# Patient Record
Sex: Male | Born: 1990 | Race: Black or African American | Hispanic: No | Marital: Single | Smoking: Current every day smoker
Health system: Southern US, Community
[De-identification: ages and names within clinical notes are randomized; demographics above are authoritative.]

## PROBLEM LIST (undated history)

## (undated) DIAGNOSIS — F419 Anxiety disorder, unspecified: Secondary | ICD-10-CM

## (undated) DIAGNOSIS — Z9889 Other specified postprocedural states: Secondary | ICD-10-CM

## (undated) DIAGNOSIS — F1910 Other psychoactive substance abuse, uncomplicated: Secondary | ICD-10-CM

## (undated) DIAGNOSIS — I10 Essential (primary) hypertension: Secondary | ICD-10-CM

## (undated) HISTORY — PX: HAND SURGERY: SHX662

---
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
## 2008-10-20 ENCOUNTER — Emergency Department: Payer: Self-pay | Admitting: Internal Medicine

## 2011-02-13 ENCOUNTER — Emergency Department: Payer: Self-pay | Admitting: Emergency Medicine

## 2012-07-22 ENCOUNTER — Emergency Department: Payer: Self-pay | Admitting: Emergency Medicine

## 2015-11-05 ENCOUNTER — Emergency Department: Payer: Medicaid Other

## 2015-11-05 ENCOUNTER — Emergency Department
Admission: EM | Admit: 2015-11-05 | Discharge: 2015-11-05 | Disposition: A | Discharge status: Home / Self Care | Payer: Medicaid Other | Attending: Emergency Medicine | Admitting: Emergency Medicine

## 2015-11-05 DIAGNOSIS — R350 Frequency of micturition: Secondary | ICD-10-CM | POA: Diagnosis not present

## 2015-11-05 DIAGNOSIS — R1031 Right lower quadrant pain: Secondary | ICD-10-CM

## 2015-11-05 LAB — COMPREHENSIVE METABOLIC PANEL
ALBUMIN: 4.5 g/dL (ref 3.5–5.0)
ALK PHOS: 82 U/L (ref 38–126)
ALT: 34 U/L (ref 17–63)
ANION GAP: 7 (ref 5–15)
AST: 28 U/L (ref 15–41)
BILIRUBIN TOTAL: 0.7 mg/dL (ref 0.3–1.2)
BUN: 16 mg/dL (ref 6–20)
CALCIUM: 9.2 mg/dL (ref 8.9–10.3)
CO2: 24 mmol/L (ref 22–32)
Chloride: 106 mmol/L (ref 101–111)
Creatinine, Ser: 0.98 mg/dL (ref 0.61–1.24)
GFR calc Af Amer: >60 mL/min (ref >60)
GFR calc non Af Amer: >60 mL/min (ref >60)
GLUCOSE: 99 mg/dL (ref 65–99)
POTASSIUM: 4 mmol/L (ref 3.5–5.1)
SODIUM: 137 mmol/L (ref 135–145)
TOTAL PROTEIN: 7.4 g/dL (ref 6.5–8.1)

## 2015-11-05 LAB — CBC WITH DIFFERENTIAL/PLATELET
BASOS ABS: 0 10*3/uL (ref 0–0.1)
BASOS PCT: 1 %
EOS ABS: 0.1 10*3/uL (ref 0–0.7)
Eosinophils Relative: 1 %
HEMATOCRIT: 43.1 % (ref 40.0–52.0)
HEMOGLOBIN: 14.4 g/dL (ref 13.0–18.0)
Lymphocytes Relative: 23 %
Lymphs Abs: 1.8 10*3/uL (ref 1.0–3.6)
MCH: 29.1 pg (ref 26.0–34.0)
MCHC: 33.4 g/dL (ref 32.0–36.0)
MCV: 87.1 fL (ref 80.0–100.0)
Monocytes Absolute: 0.4 10*3/uL (ref 0.2–1.0)
Monocytes Relative: 6 %
NEUTROS ABS: 5.7 10*3/uL (ref 1.4–6.5)
NEUTROS PCT: 69 %
Platelets: 285 10*3/uL (ref 150–440)
RBC: 4.94 MIL/uL (ref 4.40–5.90)
RDW: 12.9 % (ref 11.5–14.5)
WBC: 8.1 10*3/uL (ref 3.8–10.6)

## 2015-11-05 LAB — URINALYSIS COMPLETE WITH MICROSCOPIC (ARMC ONLY)
Bacteria, UA: NONE SEEN
Bilirubin Urine: NEGATIVE
Glucose, UA: NEGATIVE mg/dL
HGB URINE DIPSTICK: NEGATIVE
KETONES UR: NEGATIVE mg/dL
LEUKOCYTES UA: NEGATIVE
Nitrite: NEGATIVE
PH: 6 (ref 5.0–8.0)
PROTEIN: NEGATIVE mg/dL
RBC / HPF: NONE SEEN RBC/hpf (ref 0–5)
SPECIFIC GRAVITY, URINE: 1.021 (ref 1.005–1.030)
Squamous Epithelial / LPF: NONE SEEN

## 2015-11-05 MED ORDER — IOHEXOL 240 MG/ML SOLN
25.0000 mL | Freq: Once | INTRAMUSCULAR | Status: AC | PRN
  Administered 2015-11-05: 25 mL via ORAL
  Filled 2015-11-05: qty 25

## 2015-11-05 MED ORDER — IOHEXOL 300 MG/ML  SOLN
100.0000 mL | Freq: Once | INTRAMUSCULAR | Status: AC | PRN
  Administered 2015-11-05: 100 mL via INTRAVENOUS
  Filled 2015-11-05: qty 100

## 2015-11-05 NOTE — Discharge Instructions (Signed)
Abdominal Pain, Adult Many things can cause belly (abdominal) pain. Most times, the belly pain is not dangerous. Many cases of belly pain can be watched and treated at home. HOME CARE   Do not take medicines that help you go poop (laxatives) unless told to by your doctor.  Only take medicine as told by your doctor.  Eat or drink as told by your doctor. Your doctor will tell you if you should be on a special diet. GET HELP IF:  You do not know what is causing your belly pain.  You have belly pain while you are sick to your stomach (nauseous) or have runny poop (diarrhea).  You have pain while you pee or poop.  Your belly pain wakes you up at night.  You have belly pain that gets worse or better when you eat.  You have belly pain that gets worse when you eat fatty foods.  You have a fever. GET HELP RIGHT AWAY IF:   The pain does not go away within 2 hours.  You keep throwing up (vomiting).  The pain changes and is only in the right or left part of the belly.  You have bloody or tarry looking poop. MAKE SURE YOU:   Understand these instructions.  Will watch your condition.  Will get help right away if you are not doing well or get worse.   This information is not intended to replace advice given to you by your health care provider. Make sure you discuss any questions you have with your health care provider.   Document Released: 03/13/2008 Document Revised: 10/16/2014 Document Reviewed: 06/04/2013 Elsevier Interactive Patient Education [employer]   Again making a list of foods that she feet and to see if there is any correlation between your abdominal pain and diet. Increase fiber and fruits to avoid constipation. Avoid ibuprofen and Advil. He may take Tylenol if needed. Call and make an appointment with gastroenterology for further evaluation.

## 2015-11-05 NOTE — ED Notes (Addendum)
Presents with right lower abd /side pain   States this pain started about 1 year ago became worse over past 5 days.  Pain is non stop but worse today ..denies any nausea/vomiting or fever   Some urinary freq esp at night

## 2015-11-05 NOTE — ED Provider Notes (Signed)
[facility] Emergency Department Provider Note  ____________________________________________  Time seen: Approximately 12:44 PM  I have reviewed the triage vital signs and the nursing notes.   HISTORY  Chief Complaint Flank Pain   HPI Casey [name] is a 25 y.o. male is here with right lower quadrant pain which has become worse over the last 5 years. He states that pain is constant and nonradiating and worse with movement. He denies any nausea, vomiting, or fever. He states there is some urinary frequency last evening. He gives a history of pain off and on for one year however this is different and much more painful than what he has been experiencing. He denies any hematuria and bowel movements have been normal. He denies any radiation of pain into the groin. Currently his pain is 10 out of 10. His last meal was at breakfast at approximately 9 AM.   History reviewed. No pertinent past medical history.  There are no active problems to display for this patient.   History reviewed. No pertinent past surgical history.  No current outpatient prescriptions on file.  Allergies Review of patient's allergies indicates no known allergies.  No family history on file.  Social History Social History  Substance Use Topics  . Smoking status: Never Smoker   . Smokeless tobacco: None  . Alcohol Use: No    Review of Systems Constitutional: No fever/chills Cardiovascular: Denies chest pain. Respiratory: Denies shortness of breath. Gastrointestinal: Positive abdominal pain.  No nausea, no vomiting.  No diarrhea.  No constipation. Genitourinary: Frequent urination Musculoskeletal: Negative for back pain. Right lateral abdominal pain Skin: Negative for rash. Neurological: Negative for headaches, focal weakness or numbness.  10-point ROS otherwise negative.  ____________________________________________   PHYSICAL EXAM:  VITAL SIGNS: ED Triage Vitals  Enc  Vitals Group     BP 11/05/15 1228 141/88 mmHg     Pulse Rate 11/05/15 1228 64     Resp 11/05/15 1228 18     Temp 11/05/15 1228 98.4 F (36.9 C)     Temp Source 11/05/15 1228 Oral     SpO2 11/05/15 1228 98 %     Weight 11/05/15 1228 225 lb (102.059 kg)     Height 11/05/15 1228  (1.753 m)     Head Cir --      Peak Flow --      Pain Score 11/05/15 1228 10     Pain Loc --      Pain Edu? --      Excl. in GC? --     Constitutional: Alert and oriented. Well appearing and in no acute distress. Eyes: Conjunctivae are normal. PERRL. EOMI. Head: Atraumatic. Nose: No congestion/rhinnorhea. Mouth/Throat: Mucous membranes are moist.  Oropharynx non-erythematous. Neck: No stridor.   Cardiovascular: Normal rate, regular rhythm. Grossly normal heart sounds.  Good peripheral circulation. Respiratory: Normal respiratory effort.  No retractions. Lungs CTAB. Gastrointestinal: Soft. No distention. Marked tenderness on palpation of the right lower quadrant and proximal to McBurney's point. Patient was even tender with use of stethoscope. Bowel sounds are hypoactive at the time of exam. Musculoskeletal: Moves upper and lower extremities without any difficulty. Neurologic:  Normal speech and language. No gross focal neurologic deficits are appreciated. No gait instability. Skin:  Skin is warm, dry and intact. No rash noted. Psychiatric: Mood and affect are normal. Speech and behavior are normal.  ____________________________________________   LABS (all labs ordered are listed, but only abnormal results are displayed)  Labs Reviewed  URINALYSIS  COMPLETEWITH MICROSCOPIC ([facility] ONLY) - Abnormal; Notable for the following:    Color, Urine YELLOW (*)    APPearance CLEAR (*)    All other components within normal limits  CBC WITH DIFFERENTIAL/PLATELET  COMPREHENSIVE METABOLIC PANEL   RADIOLOGY CT scan of abdomen with contrast is negative per  radiologist.  ____________________________________________   PROCEDURES  Procedure(s) performed: None  Critical Care performed: No  ____________________________________________   INITIAL IMPRESSION / ASSESSMENT AND PLAN / ED COURSE  Pertinent labs & imaging results that were available during my care of the patient were reviewed by me and considered in my medical decision making (see chart for details).  Patient was made aware that lab work and urinalysis was negative. CT scan did not explain his acute on chronic back pain. Patient was referred to the gastroenterology service on call and told to make an appointment next week when the office is open. In the meantime he is to be aware of any foods that he eats that makes his abdominal pain worse and avoid constipation. ____________________________________________   FINAL CLINICAL IMPRESSION(S) / ED DIAGNOSES  Final diagnoses:  Right lower quadrant abdominal pain      Tommi Rumps, PA-C 11/05/15 1607  Jennye Moccasin, MD 11/05/15 [phone]

## 2015-11-05 NOTE — ED Notes (Signed)
Pt c/o right lower lateral side pain for years, states the pain has worsened in today.. Denies N/V/D

## 2017-03-12 ENCOUNTER — Encounter: Payer: Self-pay | Admitting: *Deleted

## 2017-03-12 DIAGNOSIS — M545 Low back pain: Secondary | ICD-10-CM | POA: Insufficient documentation

## 2017-03-12 DIAGNOSIS — R109 Unspecified abdominal pain: Secondary | ICD-10-CM | POA: Diagnosis not present

## 2017-03-12 NOTE — ED Triage Notes (Signed)
Pt complains of right lumbar pain, pt denies any other symptoms, pt reports pain started today

## 2017-03-13 ENCOUNTER — Emergency Department: Payer: Medicaid Other

## 2017-03-13 ENCOUNTER — Emergency Department
Admission: EM | Admit: 2017-03-13 | Discharge: 2017-03-13 | Disposition: A | Discharge status: Home / Self Care | Payer: Medicaid Other | Attending: Emergency Medicine | Admitting: Emergency Medicine

## 2017-03-13 DIAGNOSIS — M545 Low back pain, unspecified: Secondary | ICD-10-CM

## 2017-03-13 DIAGNOSIS — R109 Unspecified abdominal pain: Secondary | ICD-10-CM

## 2017-03-13 LAB — CBC
HEMATOCRIT: 41.2 % (ref 40.0–52.0)
HEMOGLOBIN: 14.3 g/dL (ref 13.0–18.0)
MCH: 30.2 pg (ref 26.0–34.0)
MCHC: 34.8 g/dL (ref 32.0–36.0)
MCV: 86.7 fL (ref 80.0–100.0)
Platelets: 269 10*3/uL (ref 150–440)
RBC: 4.75 MIL/uL (ref 4.40–5.90)
RDW: 13.5 % (ref 11.5–14.5)
WBC: 10.2 10*3/uL (ref 3.8–10.6)

## 2017-03-13 LAB — BASIC METABOLIC PANEL
Anion gap: 8 (ref 5–15)
BUN: 17 mg/dL (ref 6–20)
CHLORIDE: 107 mmol/L (ref 101–111)
CO2: 25 mmol/L (ref 22–32)
CREATININE: 1.12 mg/dL (ref 0.61–1.24)
Calcium: 9.2 mg/dL (ref 8.9–10.3)
GFR calc Af Amer: >60 mL/min (ref >60)
GFR calc non Af Amer: >60 mL/min (ref >60)
Glucose, Bld: 96 mg/dL (ref 65–99)
POTASSIUM: 3.9 mmol/L (ref 3.5–5.1)
Sodium: 140 mmol/L (ref 135–145)

## 2017-03-13 LAB — URINALYSIS, COMPLETE (UACMP) WITH MICROSCOPIC
Bacteria, UA: NONE SEEN
GLUCOSE, UA: NEGATIVE mg/dL
Hgb urine dipstick: NEGATIVE
Ketones, ur: 5 mg/dL — AB
Leukocytes, UA: NEGATIVE
Nitrite: NEGATIVE
Protein, ur: NEGATIVE mg/dL
RBC / HPF: NONE SEEN RBC/hpf (ref 0–5)
SPECIFIC GRAVITY, URINE: 1.033 — AB (ref 1.005–1.030)
pH: 5 (ref 5.0–8.0)

## 2017-03-13 MED ORDER — KETOROLAC TROMETHAMINE 30 MG/ML IJ SOLN
30.0000 mg | Freq: Once | INTRAMUSCULAR | Status: AC
  Administered 2017-03-13: 30 mg via INTRAVENOUS
  Filled 2017-03-13: qty 1

## 2017-03-13 MED ORDER — MORPHINE SULFATE (PF) 4 MG/ML IV SOLN
4.0000 mg | Freq: Once | INTRAVENOUS | Status: AC
Start: 2017-03-13 — End: 2017-03-13
  Administered 2017-03-13: 4 mg via INTRAVENOUS
  Filled 2017-03-13: qty 1

## 2017-03-13 MED ORDER — ETODOLAC 200 MG PO CAPS: 200.0000 mg | ORAL_CAPSULE | Freq: Three times a day (TID) | ORAL | 0 refills | Status: DC

## 2017-03-13 MED ORDER — TRAMADOL HCL 50 MG PO TABS: 50.0000 mg | ORAL_TABLET | Freq: Four times a day (QID) | ORAL | 0 refills | Status: DC | PRN

## 2017-03-13 MED ORDER — SODIUM CHLORIDE 0.9 % IV BOLUS (SEPSIS)
1000.0000 mL | Freq: Once | INTRAVENOUS | Status: AC
  Administered 2017-03-13: 1000 mL via INTRAVENOUS

## 2017-03-13 NOTE — ED Notes (Signed)
Pt reports hx of kidney stones 

## 2017-03-13 NOTE — ED Notes (Signed)
ED Provider at bedside. 

## 2017-03-13 NOTE — ED Notes (Signed)
meds given.  Family with pt.  

## 2017-03-13 NOTE — ED Notes (Signed)
Pt return from ct scan 

## 2017-03-13 NOTE — Discharge Instructions (Signed)
Please follow up with your primary care physician.

## 2017-03-13 NOTE — ED Notes (Signed)
Pt has low/mid back pain.  No known injury.  Pt staes pain for 1 hour.  Took advil without relief.  Denies urinary sx  Family with pt.

## 2017-03-13 NOTE — ED Provider Notes (Signed)
[facility] Emergency Department Provider Note   ____________________________________________   First MD Initiated Contact with Patient 03/13/17 0016     (approximate)  I have reviewed the triage vital signs and the nursing notes.   HISTORY  Chief Complaint Back Pain    HPI Casey [name] is a 26 y.o. male who came into the hospital today with some back pain. He reports that the pain started about 2 hours prior to his arrival. He was sitting on the couch watching a movie when he started having pain on his right side in his low back with some radiation around to his abdomen. The patient reports he took some Advil but it didn't help. The patient rates his pain a 10 out of 10 in intensity. He denies any pain with urination or blood in his urine. Similar symptoms last year and was told he was constipated. He reports that he still does not have regular bowel movements. The patient came into the hospital today for evaluation of this back pain.The patient had no fevers, nausea, diarrhea, chest pain or shortness of breath.   History reviewed. No pertinent past medical history.  There are no active problems to display for this patient.   History reviewed. No pertinent surgical history.  Prior to Admission medications   Medication Sig Start Date End Date Taking? Authorizing Provider  etodolac (LODINE) 200 MG capsule Take 1 capsule (200 mg total) by mouth every 8 (eight) hours. 03/13/17   Rebecka Apley, MD  traMADol (ULTRAM) 50 MG tablet Take 1 tablet (50 mg total) by mouth every 6 (six) hours as needed. 03/13/17   Rebecka Apley, MD    Allergies Patient has no known allergies.  No family history on file.  Social History Social History  Substance Use Topics  . Smoking status: Never Smoker  . Smokeless tobacco: Not on file  . Alcohol use No    Review of Systems  Constitutional: No fever/chills Eyes: No visual changes. ENT: No sore  throat. Cardiovascular: Denies chest pain. Respiratory: Denies shortness of breath. Gastrointestinal:  abdominal pain.  No nausea, no vomiting.  No diarrhea.  No constipation. Genitourinary: Negative for dysuria. Musculoskeletal: back pain. Skin: Negative for rash. Neurological: Negative for headaches, focal weakness or numbness.   ____________________________________________   PHYSICAL EXAM:  VITAL SIGNS: ED Triage Vitals  Enc Vitals Group     BP 03/12/17 2236 (!) 162/100     Pulse Rate 03/12/17 2236 84     Resp 03/12/17 2236 20     Temp 03/12/17 2236 98.1 F (36.7 C)     Temp Source 03/12/17 2236 Oral     SpO2 03/12/17 2236 100 %     Weight 03/12/17 2236 240 lb (108.9 kg)     Height 03/12/17 2236 5\' 9"  (1.753 m)     Head Circumference --      Peak Flow --      Pain Score 03/12/17 2235 10     Pain Loc --      Pain Edu? --      Excl. in GC? --     Constitutional: Alert and oriented. Well appearing and in Moderate distress. Eyes: Conjunctivae are normal. PERRL. EOMI. Head: Atraumatic. Nose: No congestion/rhinnorhea. Mouth/Throat: Mucous membranes are moist.  Oropharynx non-erythematous. Cardiovascular: Normal rate, regular rhythm. Grossly normal heart sounds.  Good peripheral circulation. Respiratory: Normal respiratory effort.  No retractions. Lungs CTAB. Gastrointestinal: Soft and nontender. No distention. Positive bowel sounds with some right  CVA tenderness to palpation Musculoskeletal: No lower extremity tenderness nor edema.   Neurologic:  Normal speech and language.  Skin:  Skin is warm, dry and intact.Marland Kitchen. Psychiatric: Mood and affect are normal.   ____________________________________________   LABS (all labs ordered are listed, but only abnormal results are displayed)  Labs Reviewed  URINALYSIS, COMPLETE (UACMP) WITH MICROSCOPIC - Abnormal; Notable for the following:       Result Value   Color, Urine YELLOW (*)    APPearance CLEAR (*)    Specific Gravity,  Urine 1.033 (*)    Bilirubin Urine SMALL (*)    Ketones, ur 5 (*)    Squamous Epithelial / LPF 0-5 (*)    All other components within normal limits  CBC  BASIC METABOLIC PANEL   ____________________________________________  EKG  none ____________________________________________  RADIOLOGY  Ct Renal Stone Study  Result Date: 03/13/2017 CLINICAL DATA:  Right flank pain. EXAM: CT ABDOMEN AND PELVIS WITHOUT CONTRAST TECHNIQUE: Multidetector CT imaging of the abdomen and pelvis was performed following the standard protocol without IV contrast. COMPARISON:  CT 11/05/2015 FINDINGS: Lower chest: The lung bases are clear. Hepatobiliary: No focal liver abnormality is seen. No gallstones, gallbladder wall thickening, or biliary dilatation. Pancreas: No ductal dilatation or inflammation. Spleen: Normal in size without focal abnormality. Adrenals/Urinary Tract: Adrenal glands are unremarkable. Kidneys are normal, without renal calculi, focal lesion, or hydronephrosis. Bladder is completely decompressed and not well evaluated. No evidence of bladder stone. Right pelvic calcification is a phlebolith and unchanged from prior CT, inferior to the ureteral insertion. Stomach/Bowel: Stomach is within normal limits. Appendix appears normal. No evidence of bowel wall thickening, distention, or inflammatory changes. Vascular/Lymphatic: No significant vascular findings are present. No enlarged abdominal or pelvic lymph nodes. Reproductive: Prostate is unremarkable. Other: No free air, free fluid, or intra-abdominal fluid collection. Musculoskeletal: There are no acute or suspicious osseous abnormalities. IMPRESSION: No renal stones or obstructive uropathy. No acute abnormality in the abdomen/pelvis. Electronically Signed   By: Rubye OaksMelanie  Ehinger M.D.   On: 03/13/2017 01:42    ____________________________________________   PROCEDURES  Procedure(s) performed: None  Procedures  Critical Care performed:  No  ____________________________________________   INITIAL IMPRESSION / ASSESSMENT AND PLAN / ED COURSE  Pertinent labs & imaging results that were available during my care of the patient were reviewed by me and considered in my medical decision making (see chart for details).  This is a 26 year old male who comes into the hospital today with some back pain. The patient reports that started right before he came in. I did give the patient liter of normal saline as well as some morphine and I sent him for CT scan looking for possible kidney stones. The patient's CT scan is unremarkable and his urine does not show any blood. I did give the patient a shot of Toradol. I am unsure what caused this patient's pain but I will discharge him to follow-up with his primary care physician. The patient has no further complaints or concerns at this time. His pain is improved after the fluid and the pain medicine.  Clinical Course as of Mar 13 733  Tue Mar 13, 2017  0149 No renal stones or obstructive uropathy. No acute abnormality in the abdomen/pelvis.   CT Renal Soundra PilonStone Study [AW]    Clinical Course User Index [AW] Rebecka ApleyWebster, Cedar Ditullio P, MD     ____________________________________________   FINAL CLINICAL IMPRESSION(S) / ED DIAGNOSES  Final diagnoses:  Acute right-sided low back pain without sciatica  Flank pain      NEW MEDICATIONS STARTED DURING THIS VISIT:  Discharge Medication List as of 03/13/2017  2:11 AM    START taking these medications   Details  etodolac (LODINE) 200 MG capsule Take 1 capsule (200 mg total) by mouth every 8 (eight) hours., Starting Tue 03/13/2017, Print    traMADol (ULTRAM) 50 MG tablet Take 1 tablet (50 mg total) by mouth every 6 (six) hours as needed., Starting Tue 03/13/2017, Print         Note:  This document was prepared using Dragon voice recognition software and may include unintentional dictation errors.    Rebecka Apley, MD 03/13/17 [phone]

## 2017-10-14 ENCOUNTER — Encounter: Payer: Self-pay | Admitting: Intensive Care

## 2017-10-14 ENCOUNTER — Emergency Department: Payer: Medicaid Other

## 2017-10-14 ENCOUNTER — Emergency Department
Admission: EM | Admit: 2017-10-14 | Discharge: 2017-10-14 | Disposition: A | Discharge status: Home / Self Care | Payer: Medicaid Other | Attending: Emergency Medicine | Admitting: Emergency Medicine

## 2017-10-14 DIAGNOSIS — M79641 Pain in right hand: Secondary | ICD-10-CM | POA: Diagnosis present

## 2017-10-14 DIAGNOSIS — Y939 Activity, unspecified: Secondary | ICD-10-CM | POA: Diagnosis not present

## 2017-10-14 DIAGNOSIS — Y929 Unspecified place or not applicable: Secondary | ICD-10-CM | POA: Diagnosis not present

## 2017-10-14 DIAGNOSIS — S6991XA Unspecified injury of right wrist, hand and finger(s), initial encounter: Secondary | ICD-10-CM

## 2017-10-14 DIAGNOSIS — Z79899 Other long term (current) drug therapy: Secondary | ICD-10-CM | POA: Insufficient documentation

## 2017-10-14 DIAGNOSIS — Y999 Unspecified external cause status: Secondary | ICD-10-CM | POA: Insufficient documentation

## 2017-10-14 DIAGNOSIS — W228XXA Striking against or struck by other objects, initial encounter: Secondary | ICD-10-CM | POA: Diagnosis not present

## 2017-10-14 DIAGNOSIS — Z23 Encounter for immunization: Secondary | ICD-10-CM | POA: Insufficient documentation

## 2017-10-14 MED ORDER — KETOROLAC TROMETHAMINE 30 MG/ML IJ SOLN
30.0000 mg | Freq: Once | INTRAMUSCULAR | Status: AC
  Administered 2017-10-14: 30 mg via INTRAMUSCULAR
  Filled 2017-10-14: qty 1

## 2017-10-14 MED ORDER — IBUPROFEN 800 MG PO TABS: 800.0000 mg | ORAL_TABLET | Freq: Three times a day (TID) | ORAL | 0 refills | Status: DC | PRN

## 2017-10-14 MED ORDER — TETANUS-DIPHTH-ACELL PERTUSSIS 5-2.5-18.5 LF-MCG/0.5 IM SUSP
0.5000 mL | Freq: Once | INTRAMUSCULAR | Status: AC
Start: 2017-10-14 — End: 2017-10-14
  Administered 2017-10-14: 0.5 mL via INTRAMUSCULAR
  Filled 2017-10-14: qty 0.5

## 2017-10-14 NOTE — ED Provider Notes (Signed)
[facility] Emergency Department Provider Note  ____________________________________________  Time seen: Approximately 3:29 PM  I have reviewed the triage vital signs and the nursing notes.   HISTORY  Chief Complaint Hand Pain (right)    HPI Casey [name] is a 27 y.o. male that presents to the emergency department for right hand pain after injury. A motor from a car fell on his hand yesterday while doing car work. He is not concerned that anything is broken because he can move all of his fingers.  He is having pain primarily over his knuckles.  He has been applying alcohol to abrasions on hand.  No alleviating measures have been attempted.  No numbness, tingling.  History reviewed. No pertinent past medical history.  There are no active problems to display for this patient.   History reviewed. No pertinent surgical history.  Prior to Admission medications   Medication Sig Start Date End Date Taking? Authorizing Provider  etodolac (LODINE) 200 MG capsule Take 1 capsule (200 mg total) by mouth every 8 (eight) hours. 03/13/17   Rebecka ApleyWebster, Allison P, MD  ibuprofen (ADVIL,MOTRIN) 800 MG tablet Take 1 tablet (800 mg total) by mouth every 8 (eight) hours as needed. 10/14/17   Enid DerryWagner, Satoshi Kalas, PA-C  traMADol (ULTRAM) 50 MG tablet Take 1 tablet (50 mg total) by mouth every 6 (six) hours as needed. 03/13/17   Rebecka ApleyWebster, Allison P, MD    Allergies Patient has no known allergies.  History reviewed. No pertinent family history.  Social History Social History   Tobacco Use  . Smoking status: Never Smoker  . Smokeless tobacco: Never Used  Substance Use Topics  . Alcohol use: No  . Drug use: No     Review of Systems  Constitutional: No fever/chills Cardiovascular: No chest pain. Respiratory: No SOB. Musculoskeletal: Positive for hand pain. Skin: Negative for rash, ecchymosis.  Positive for abrasions. Neurological: Negative for numbness or  tingling   ____________________________________________   PHYSICAL EXAM:  VITAL SIGNS: ED Triage Vitals  Enc Vitals Group     BP 10/14/17 1354 140/88     Pulse Rate 10/14/17 1354 67     Resp 10/14/17 1354 16     Temp 10/14/17 1354 (!) 97.5 F (36.4 C)     Temp Source 10/14/17 1354 Oral     SpO2 10/14/17 1354 95 %     Weight 10/14/17 1355 220 lb (99.8 kg)     Height 10/14/17 1355 5\' 9"  (1.753 m)     Head Circumference --      Peak Flow --      Pain Score 10/14/17 1358 8     Pain Loc --      Pain Edu? --      Excl. in GC? --      Constitutional: Alert and oriented. Well appearing and in no acute distress. Eyes: Conjunctivae are normal. PERRL. EOMI. Head: Atraumatic. ENT:      Ears:      Nose: No congestion/rhinnorhea.      Mouth/Throat: Mucous membranes are moist.  Neck: No stridor.   Cardiovascular: Normal rate, regular rhythm.  Good peripheral circulation. Respiratory: Normal respiratory effort without tachypnea or retractions. Lungs CTAB. Good air entry to the bases with no decreased or absent breath sounds. Musculoskeletal: Full range of motion to all extremities. No gross deformities appreciated.  Full range of motion of all fingers and right wrist.  No visible swelling.  Compartments are soft. Neurologic:  Normal speech and language. No gross  focal neurologic deficits are appreciated.  Skin:  Skin is warm, dry.  Several half centimeter abrasions to right hand.    ____________________________________________   LABS (all labs ordered are listed, but only abnormal results are displayed)  Labs Reviewed - No data to display ____________________________________________  EKG   ____________________________________________  RADIOLOGY Lexine Baton, personally viewed and evaluated these images (plain radiographs) as part of my medical decision making, as well as reviewing the written report by the radiologist.  Dg Hand Complete Right  Result Date:  10/14/2017 CLINICAL DATA:  27 year old male with right hand pain and laceration. EXAM: RIGHT HAND - COMPLETE 3+ VIEW COMPARISON:  None. FINDINGS: No evidence of fracture or retained radiopaque foreign body. The visualized bones and joints are unremarkable. Normal mineralization. No lytic or blastic osseous lesion. IMPRESSION: Negative. Electronically Signed   By: Malachy Moan M.D.   On: 10/14/2017 14:31    ____________________________________________    PROCEDURES  Procedure(s) performed:    Procedures    Medications  Tdap (BOOSTRIX) injection 0.5 mL (0.5 mLs Intramuscular Given 10/14/17 1630)  ketorolac (TORADOL) 30 MG/ML injection 30 mg (30 mg Intramuscular Given 10/14/17 1630)     ____________________________________________   INITIAL IMPRESSION / ASSESSMENT AND PLAN / ED COURSE  Pertinent labs & imaging results that were available during my care of the patient were reviewed by me and considered in my medical decision making (see chart for details).  Review of the [place] CSRS was performed in accordance of the [facility] prior to dispensing any controlled drugs.     Patient presented to the emergency department for evaluation of right hand injury.  Vital signs and exam are reassuring.  Hand x-ray negative for acute bony abnormalities.  Tetanus shot was updated.  Wrist splint was given for hand stabilization.  Patient will be discharged home with prescriptions for ibuprofen. Patient is to follow up with PCP as directed. Patient is given ED precautions to return to the ED for any worsening or new symptoms.     ____________________________________________  FINAL CLINICAL IMPRESSION(S) / ED DIAGNOSES  Final diagnoses:  Injury of right hand, initial encounter      NEW MEDICATIONS STARTED DURING THIS VISIT:  ED Discharge Orders        Ordered    ibuprofen (ADVIL,MOTRIN) 800 MG tablet  Every 8 hours PRN     10/14/17 1559          This chart was dictated using voice  recognition software/Dragon. Despite best efforts to proofread, errors can occur which can change the meaning. Any change was purely unintentional.    Enid Derry, PA-C 10/14/17 1710    Governor Rooks, MD 10/18/17 [phone]

## 2017-10-14 NOTE — ED Notes (Signed)
"  I was working on a car and the motor fell on me - it crushed my hand - my little finger  I can barely move."

## 2017-10-14 NOTE — ED Notes (Signed)

## 2017-10-14 NOTE — ED Triage Notes (Signed)
PAtient was working on car at home and jack fell on patients R hand.

## 2017-10-21 ENCOUNTER — Emergency Department: Payer: Medicaid Other

## 2017-10-21 ENCOUNTER — Other Ambulatory Visit: Payer: Self-pay

## 2017-10-21 ENCOUNTER — Encounter: Payer: Self-pay | Admitting: Emergency Medicine

## 2017-10-21 ENCOUNTER — Emergency Department
Admission: EM | Admit: 2017-10-21 | Discharge: 2017-10-21 | Disposition: A | Discharge status: Home / Self Care | Payer: Medicaid Other | Attending: Emergency Medicine | Admitting: Emergency Medicine

## 2017-10-21 DIAGNOSIS — Y939 Activity, unspecified: Secondary | ICD-10-CM | POA: Insufficient documentation

## 2017-10-21 DIAGNOSIS — W5501XA Bitten by cat, initial encounter: Secondary | ICD-10-CM | POA: Insufficient documentation

## 2017-10-21 DIAGNOSIS — S61231A Puncture wound without foreign body of left index finger without damage to nail, initial encounter: Secondary | ICD-10-CM | POA: Diagnosis not present

## 2017-10-21 DIAGNOSIS — Y999 Unspecified external cause status: Secondary | ICD-10-CM | POA: Diagnosis not present

## 2017-10-21 DIAGNOSIS — Y929 Unspecified place or not applicable: Secondary | ICD-10-CM | POA: Diagnosis not present

## 2017-10-21 MED ORDER — AMOXICILLIN-POT CLAVULANATE 875-125 MG PO TABS: 1.0000 | ORAL_TABLET | Freq: Two times a day (BID) | ORAL | 0 refills | Status: AC

## 2017-10-21 NOTE — ED Triage Notes (Signed)
First Nurse Note:  Arrives with c/o cat bite and scratch to right index finger.  Injury occurred yesterday.  Cat is a stray.
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
## 2017-10-21 NOTE — Discharge Instructions (Signed)
Return to the ED this week if cat is not caught or if pain, redness, swelling over finger worsens.

## 2017-10-21 NOTE — ED Triage Notes (Signed)
Pt reports a stray cat in their apartment complex bit him last night.

## 2017-12-19 ENCOUNTER — Encounter: Payer: Self-pay | Admitting: Emergency Medicine

## 2017-12-19 ENCOUNTER — Emergency Department
Admission: EM | Admit: 2017-12-19 | Discharge: 2017-12-19 | Disposition: A | Discharge status: Home / Self Care | Payer: Medicaid Other | Attending: Emergency Medicine | Admitting: Emergency Medicine

## 2017-12-19 ENCOUNTER — Other Ambulatory Visit: Payer: Self-pay

## 2017-12-19 DIAGNOSIS — J069 Acute upper respiratory infection, unspecified: Secondary | ICD-10-CM

## 2017-12-19 DIAGNOSIS — R0981 Nasal congestion: Secondary | ICD-10-CM | POA: Diagnosis present

## 2017-12-19 DIAGNOSIS — B9789 Other viral agents as the cause of diseases classified elsewhere: Secondary | ICD-10-CM | POA: Diagnosis not present

## 2017-12-19 MED ORDER — PSEUDOEPH-BROMPHEN-DM 30-2-10 MG/5ML PO SYRP: 5.0000 mL | ORAL_SOLUTION | Freq: Four times a day (QID) | ORAL | 0 refills | Status: DC | PRN

## 2017-12-19 NOTE — ED Provider Notes (Signed)
[facility] Emergency Department Provider Note  ____________________________________________   First MD Initiated Contact with Patient 12/19/17 1357     (approximate)  I have reviewed the triage vital signs and the nursing notes.   HISTORY  Chief Complaint flu like symptoms   HPI Casey [name] is a 27 y.o. male here with complaint of rhinorrhea, congestion, headache for 2-1/2 days.  Patient has been taking NyQuil over-the-counter for his symptoms and using cough drops.  He has not taken his temperature but is "felt feverish".  He states his wife was diagnosed with the flu however wife states that she was not tested for the flu and is better.  He rates his pain as a 10/10.   History reviewed. No pertinent past medical history.  There are no active problems to display for this patient.   History reviewed. No pertinent surgical history.  Prior to Admission medications   Medication Sig Start Date End Date Taking? Authorizing Provider  brompheniramine-pseudoephedrine-DM 30-2-10 MG/5ML syrup Take 5 mLs by mouth 4 (four) times daily as needed. 12/19/17   Tommi Rumps, PA-C    Allergies Patient has no known allergies.  History reviewed. No pertinent family history.  Social History Social History   Tobacco Use  . Smoking status: Never Smoker  . Smokeless tobacco: Never Used  Substance Use Topics  . Alcohol use: No  . Drug use: No    Review of Systems Constitutional: No fever/chills Eyes: No visual changes. ENT: Positive sore throat.  Positive rhinorrhea. Cardiovascular: Denies chest pain. Respiratory: Denies shortness of breath.  Positive cough. Gastrointestinal: No abdominal pain.  No nausea, no vomiting.  No diarrhea.   Genitourinary: Negative for dysuria. Musculoskeletal: Negative for muscle aches. Skin: Negative for rash. Neurological: Negative for headaches, focal weakness or  numbness. ___________________________________________   PHYSICAL EXAM:  VITAL SIGNS: ED Triage Vitals [12/19/17 1249]  Enc Vitals Group     BP (!) 145/91     Pulse Rate 83     Resp 18     Temp 97.8 F (36.6 C)     Temp Source Oral     SpO2 99 %     Weight 240 lb (108.9 kg)     Height  (1.753 m)     Head Circumference      Peak Flow      Pain Score 10     Pain Loc      Pain Edu?      Excl. in GC?    Constitutional: Alert and oriented. Well appearing and in no acute distress. Eyes: Conjunctivae are normal.  Head: Atraumatic. Nose: Mild congestion/rhinnorhea.  TMs dull bilaterally. Mouth/Throat: Mucous membranes are moist.  Oropharynx non-erythematous.  Positive posterior drainage. Neck: No stridor.   Hematological/Lymphatic/Immunilogical: No cervical lymphadenopathy. Cardiovascular: Normal rate, regular rhythm. Grossly normal heart sounds.  Good peripheral circulation. Respiratory: Normal respiratory effort.  No retractions. Lungs CTAB. Gastrointestinal: Soft and nontender. No distention.  Musculoskeletal: Moves upper and lower extremities without any difficulty.  Normal gait was noted. Neurologic:  Normal speech and language. No gross focal neurologic deficits are appreciated.  Skin:  Skin is warm, dry and intact. No rash noted. Psychiatric: Mood and affect are normal. Speech and behavior are normal.  ____________________________________________   LABS (all labs ordered are listed, but only abnormal results are displayed)  Labs Reviewed - No data to display  PROCEDURES  Procedure(s) performed: None  Procedures  Critical Care performed: No  ____________________________________________   INITIAL  IMPRESSION / ASSESSMENT AND PLAN / ED COURSE Patient was reassured that there are no signs that are suspicious for influenza.  He was given a prescription for Bromfed-DM as needed for cough and congestion.  He is to increase fluids.  Tylenol or ibuprofen as needed.   He is to follow-up with his PCP if any continued problems.  ____________________________________________   FINAL CLINICAL IMPRESSION(S) / ED DIAGNOSES  Final diagnoses:  Viral URI with cough     ED Discharge Orders        Ordered    brompheniramine-pseudoephedrine-DM 30-2-10 MG/5ML syrup  4 times daily PRN     12/19/17 1452       Note:  This document was prepared using Dragon voice recognition software and may include unintentional dictation errors.    Tommi Rumps, PA-C 12/19/17 1640    Nita Sickle, MD 12/20/17 1500

## 2017-12-19 NOTE — ED Triage Notes (Signed)
Here for flu like symptoms.  Has had runny nose, cough, congestion, headache since Monday. Whole house has been sick. Wife had flu.  VSS

## 2017-12-19 NOTE — Discharge Instructions (Signed)
Follow-up with your primary care provider if any continued problems.  Discontinue taking over-the-counter medication at home and begin taking Bromfed-DM as needed for cough and congestion.  Increase fluids.  Tylenol or ibuprofen as needed for headache, body aches, or fever.

## 2018-02-09 ENCOUNTER — Other Ambulatory Visit: Payer: Self-pay

## 2018-02-09 ENCOUNTER — Emergency Department: Payer: Medicaid Other

## 2018-02-09 ENCOUNTER — Encounter: Payer: Self-pay | Admitting: Emergency Medicine

## 2018-02-09 ENCOUNTER — Inpatient Hospital Stay
Admission: EM | Admit: 2018-02-09 | Discharge: 2018-02-11 | DRG: 159 | Disposition: A | Discharge status: Home / Self Care | Payer: Medicaid Other | Attending: Internal Medicine | Admitting: Internal Medicine

## 2018-02-09 DIAGNOSIS — R1310 Dysphagia, unspecified: Secondary | ICD-10-CM | POA: Diagnosis present

## 2018-02-09 DIAGNOSIS — R590 Localized enlarged lymph nodes: Secondary | ICD-10-CM | POA: Diagnosis present

## 2018-02-09 DIAGNOSIS — K122 Cellulitis and abscess of mouth: Secondary | ICD-10-CM | POA: Diagnosis present

## 2018-02-09 DIAGNOSIS — R229 Localized swelling, mass and lump, unspecified: Secondary | ICD-10-CM

## 2018-02-09 DIAGNOSIS — R609 Edema, unspecified: Secondary | ICD-10-CM

## 2018-02-09 DIAGNOSIS — IMO0002 Reserved for concepts with insufficient information to code with codable children: Secondary | ICD-10-CM

## 2018-02-09 HISTORY — DX: Other specified postprocedural states: Z98.890

## 2018-02-09 LAB — COMPREHENSIVE METABOLIC PANEL
ALT: 22 U/L (ref 17–63)
AST: 22 U/L (ref 15–41)
Albumin: 4.7 g/dL (ref 3.5–5.0)
Alkaline Phosphatase: 96 U/L (ref 38–126)
Anion gap: 8 (ref 5–15)
BUN: 9 mg/dL (ref 6–20)
CHLORIDE: 104 mmol/L (ref 101–111)
CO2: 26 mmol/L (ref 22–32)
CREATININE: 1.19 mg/dL (ref 0.61–1.24)
Calcium: 9.5 mg/dL (ref 8.9–10.3)
GFR calc non Af Amer: >60 mL/min (ref >60)
Glucose, Bld: 108 mg/dL — ABNORMAL HIGH (ref 65–99)
POTASSIUM: 4.3 mmol/L (ref 3.5–5.1)
SODIUM: 138 mmol/L (ref 135–145)
Total Bilirubin: 0.6 mg/dL (ref 0.3–1.2)
Total Protein: 8.4 g/dL — ABNORMAL HIGH (ref 6.5–8.1)

## 2018-02-09 LAB — CBC WITH DIFFERENTIAL/PLATELET
Basophils Absolute: 0 10*3/uL (ref 0–0.1)
Basophils Relative: 1 %
Eosinophils Absolute: 0.2 10*3/uL (ref 0–0.7)
Eosinophils Relative: 2 %
HEMATOCRIT: 45.5 % (ref 40.0–52.0)
HEMOGLOBIN: 15.4 g/dL (ref 13.0–18.0)
LYMPHS ABS: 1.5 10*3/uL (ref 1.0–3.6)
LYMPHS PCT: 17 %
MCH: 29.6 pg (ref 26.0–34.0)
MCHC: 33.9 g/dL (ref 32.0–36.0)
MCV: 87.3 fL (ref 80.0–100.0)
MONOS PCT: 9 %
Monocytes Absolute: 0.8 10*3/uL (ref 0.2–1.0)
NEUTROS ABS: 6.4 10*3/uL (ref 1.4–6.5)
NEUTROS PCT: 71 %
Platelets: 311 10*3/uL (ref 150–440)
RBC: 5.21 MIL/uL (ref 4.40–5.90)
RDW: 13.5 % (ref 11.5–14.5)
WBC: 9 10*3/uL (ref 3.8–10.6)

## 2018-02-09 MED ORDER — ACETAMINOPHEN 325 MG PO TABS
650.0000 mg | ORAL_TABLET | Freq: Four times a day (QID) | ORAL | Status: DC | PRN
  Administered 2018-02-09: 650 mg via ORAL
  Filled 2018-02-09 (×2): qty 2

## 2018-02-09 MED ORDER — IOPAMIDOL (ISOVUE-370) INJECTION 76%
75.0000 mL | Freq: Once | INTRAVENOUS | Status: AC | PRN
  Administered 2018-02-09: 75 mL via INTRAVENOUS
  Filled 2018-02-09: qty 75

## 2018-02-09 MED ORDER — IBUPROFEN 400 MG PO TABS
400.0000 mg | ORAL_TABLET | Freq: Four times a day (QID) | ORAL | Status: DC | PRN
  Administered 2018-02-10 (×2): 400 mg via ORAL
  Filled 2018-02-09 (×2): qty 1

## 2018-02-09 MED ORDER — CLINDAMYCIN PHOSPHATE 600 MG/50ML IV SOLN: 600.0000 mg | Freq: Once | INTRAVENOUS | Status: DC

## 2018-02-09 MED ORDER — ACETAMINOPHEN 650 MG RE SUPP: 650.0000 mg | Freq: Four times a day (QID) | RECTAL | Status: DC | PRN

## 2018-02-09 MED ORDER — ONDANSETRON HCL 4 MG/2ML IJ SOLN: 4.0000 mg | Freq: Four times a day (QID) | INTRAMUSCULAR | Status: DC | PRN

## 2018-02-09 MED ORDER — ENOXAPARIN SODIUM 40 MG/0.4ML ~~LOC~~ SOLN: 40.0000 mg | SUBCUTANEOUS | Status: DC

## 2018-02-09 MED ORDER — POLYETHYLENE GLYCOL 3350 17 G PO PACK: 17.0000 g | PACK | Freq: Every day | ORAL | Status: DC | PRN

## 2018-02-09 MED ORDER — ONDANSETRON HCL 4 MG PO TABS: 4.0000 mg | ORAL_TABLET | Freq: Four times a day (QID) | ORAL | Status: DC | PRN

## 2018-02-09 MED ORDER — AMPICILLIN-SULBACTAM SODIUM 3 (2-1) G IJ SOLR
3.0000 g | Freq: Once | INTRAMUSCULAR | Status: AC
  Administered 2018-02-09: 3 g via INTRAVENOUS
  Filled 2018-02-09: qty 3

## 2018-02-09 NOTE — ED Provider Notes (Signed)
[facility] Emergency Department Provider Note  ____________________________________________   First MD Initiated Contact with Patient 02/09/18 1311     (approximate)  I have reviewed the triage vital signs and the nursing notes.   HISTORY  Chief Complaint Facial Swelling   HPI Casey [name] is a 27 y.o. male is here with complaint of swelling under his chin for 1 week.  Patient states there is been no injury and he has not had anything similar to this in the past.  Patient denies any fever, chills, nausea or vomiting.  He states that it appears to get bigger when he is eating.  He denies any shaving injuries or pulling at any hair on his chin.  He denies any difficulty swallowing or talking.  He rates his pain as 10/10.   Past Medical History:  Diagnosis Date  . H/O eye surgery     Patient Active Problem List   Diagnosis Date Noted  . 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 No pertinent surgical history.  Prior to Admission medications   Not on File    Allergies Patient has no known allergies.  Family History  Problem Relation Age of Onset  . Diabetes Father   . Seizures Father     Social History Social History   Tobacco Use  . Smoking status: Never Smoker  . Smokeless tobacco: Never Used  Substance Use Topics  . Alcohol use: No  . Drug use: No    Review of Systems Constitutional: No fever/chills Eyes: No visual changes. ENT: No sore throat.  Positive swelling chin area.  Negative for dental pain. Cardiovascular: Denies chest pain. Respiratory: Denies shortness of breath. Gastrointestinal:  No nausea, no vomiting.  Musculoskeletal: Negative for muscle aches. Skin: Negative for rash.  Positive for soft tissue swelling chin area. Neurological: Negative for headaches. ____________________________________________   PHYSICAL EXAM:  VITAL SIGNS: ED Triage Vitals  Enc Vitals Group     BP 02/09/18 1258 (!) 142/101       Pulse Rate 02/09/18 1258 (!) 102     Resp 02/09/18 1258 18     Temp 02/09/18 1258 98.6 F (37 C)     Temp Source 02/09/18 1258 Oral     SpO2 02/09/18 1258 98 %     Weight 02/09/18 1259 230 lb (104.3 kg)     Height 02/09/18 1259  (1.753 m)     Head Circumference --      Peak Flow --      Pain Score 02/09/18 1259 10     Pain Loc --      Pain Edu? --      Excl. in GC? --    Constitutional: Alert and oriented. Well appearing and in no acute distress. Eyes: Conjunctivae are normal.  Head: Atraumatic. Nose: No congestion/rhinnorhea. Mouth/Throat: Mucous membranes are moist.  Oropharynx non-erythematous.  Uvula midline.  No lesions are noted and teeth in this area do not show any evidence of dental abscess.  Patient is not having any difficulty maintaining secretions. Neck: No stridor.   Hematological/Lymphatic/Immunilogical: Positive bilateral cervical lymphadenopathy. Cardiovascular: Normal rate, regular rhythm. Grossly normal heart sounds.  Good peripheral circulation. Respiratory: Normal respiratory effort.  No retractions. Lungs CTAB. Musculoskeletal: Moves upper and lower extremities without any difficulty and normal gait was noted. Neurologic:  Normal speech and language. No gross focal neurologic deficits are appreciated.  Skin:  Skin is warm, dry and intact.  Psychiatric: Mood and affect are normal. Speech  and behavior are normal.  ____________________________________________   LABS (all labs ordered are listed, but only abnormal results are displayed)  Labs Reviewed  COMPREHENSIVE METABOLIC PANEL - Abnormal; Notable for the following components:      Result Value   Glucose, Bld 108 (*)    Total Protein 8.4 (*)    All other components within normal limits  CULTURE, BLOOD (ROUTINE X 2)  CULTURE, BLOOD (ROUTINE X 2)  CBC WITH DIFFERENTIAL/PLATELET  HIV ANTIBODY (ROUTINE TESTING)  HEMOGLOBIN A1C     RADIOLOGY   Official radiology report(s): Ct Soft Tissue  Neck W Contrast  Result Date: 02/09/2018 CLINICAL DATA:  Initial evaluation for acute pain and swelling inferior to jaw, pain with swallowing. EXAM: CT NECK WITH CONTRAST TECHNIQUE: Multidetector CT imaging of the neck was performed using the standard protocol following the bolus administration of intravenous contrast. CONTRAST:  75mL ISOVUE-370 IOPAMIDOL (ISOVUE-370) INJECTION 76% COMPARISON:  None available. FINDINGS: Pharynx and larynx: Oral cavity within normal limits without mass lesion or loculated fluid collection. Prominent dental carie with periapical lucency present at the left second maxillary molar. No acute inflammatory changes about the teeth themselves. Palatine tonsils symmetric and within normal limits. Parapharyngeal fat maintained. Nasopharynx normal. No retropharyngeal swelling or collection. Epiglottis normal. Vallecula clear. Remainder of the hypopharynx and supraglottic larynx within normal limits. True cords symmetric and normal. Subglottic airway clear. Salivary glands: Parotid glands within normal limits bilaterally. Submandibular glands themselves symmetric in appearance and within normal limits. Adjacent inflammatory stranding noted within the adjacent submandibular spaces and submental region. No sialolithiasis or evidence for acute sialoadenitis. Thyroid: Left lobe of thyroid hypoplastic. Thyroid otherwise unremarkable. Lymph nodes: Enlarged bilateral level IB lymph nodes measure up to 19 mm on the right and 12 mm on the left. Enlarged level I a nodes measure up to 17 mm. Subtle hypodensity within a right submental node may reflect early necrosis (series 2, image 54). There is associated inflammatory stranding and induration within the adjacent submental region, extending into the bilateral submandibular spaces, slightly greater on the right. Thickening and irregularity of the platysmas bilaterally. Slight inferior extension findings most consistent with acute infection/cellulitis. No  abscess or drainable fluid collection identified. No extension into the floor of mouth/sublingual space at this time. No other adenopathy within the neck. Vascular: Normal intravascular enhancement seen throughout the neck. Limited intracranial: Unremarkable. Visualized orbits: Visualized globes and orbital soft tissues within normal limits. Mastoids and visualized paranasal sinuses: Visualized paranasal sinuses are largely clear. Mastoids and middle ear cavities are clear. Skeleton: No acute osseous abnormality. No worrisome lytic or blastic osseous lesions. Upper chest: Visualized upper chest within normal limits. Partially visualized upper lungs are grossly clear. Other: None. IMPRESSION: 1. Enlarged bilateral level Roman numeral I/II adenopathy with associated inflammatory stranding and induration within the adjacent submental region, extending into the bilateral submandibular spaces. Findings concerning for acute infection/cellulitis/Ludwig's angina. No overt extension into the floor of mouth/sublingual space at this time. No discrete abscess or drainable fluid collection. 2. Prominent dental carie at the left second maxillary molar, felt to be unlikely as source of infection. Electronically Signed   By: Rise Mu M.D.   On: 02/09/2018 16:03   US Soft Tissue Neck  Result Date: 02/09/2018 CLINICAL DATA:  Swelling.  Mass. EXAM: ULTRASOUND OF HEAD/NECK SOFT TISSUES TECHNIQUE: Ultrasound examination of the head and neck soft tissues was performed in the area of clinical concern. COMPARISON:  None. FINDINGS: Multiple hypoechoic lesions with increased through transmission are evident the  submental region and right submandibular space. There smaller lesions in the right lateral neck. The largest lesion is at the right submandibular space measuring 2.4 x 1.6 x 1.5 cm. IMPRESSION: 1. Multiple hypoechoic lesions. These are most compatible with necrotic or suppurative lymph nodes. Recommend CT the neck with  contrast for further evaluation of these nodes and related pathologies. Electronically Signed   By: Marin Roberts M.D.   On: 02/09/2018 14:48    ____________________________________________   PROCEDURES  Procedure(s) performed: None  Procedures  Critical Care performed: No  ____________________________________________   INITIAL IMPRESSION / ASSESSMENT AND PLAN / ED COURSE  Patient presents with soft tissue edema submental area for approximately 1 week.  He denies any history of injury, fever, chills.  He is unaware of any dental issues to explain this. Discussed CT findings of possible Ludwick's angina with Dr. Roxan Hockey who is my attending today.  Patient received clindamycin 600 mg IV with Unasyn 3 gm IV.  Arrangements were made for admission.  Patient received clindamycin and Unasyn while in the emergency department.  ____________________________________________   FINAL CLINICAL IMPRESSION(S) / ED DIAGNOSES  Final diagnoses:  Swelling  Mass  Ludwig's angina     ED Discharge Orders    None       Note:  This document was prepared using Dragon voice recognition software and may include unintentional dictation errors.    Tommi Rumps, PA-C 02/09/18 1655    Willy Eddy, MD 02/10/18 [phone]

## 2018-02-09 NOTE — ED Triage Notes (Signed)
Swelling under chin x 1 week. States worse it eats something. No resp distress.

## 2018-02-09 NOTE — H&P (Signed)
[facility] [place] [facility]   PATIENT NAME: Casey [name]    MR#:  [id number]  DATE OF BIRTH:  [birth year]  DATE OF ADMISSION:  02/09/2018  PRIMARY CARE PHYSICIAN: [facility]   REQUESTING/REFERRING PHYSICIAN: Dr. Roxan Hockey   CHIEF COMPLAINT:   Chief Complaint  Patient presents with  . Facial Swelling    HISTORY OF PRESENT ILLNESS:  Casey [name]  is a 27 y.o. male with family history of diabetes mellitus presents to the emergency room with 1 week of redness and swelling in the submandibular area.  This has slowly worsened.  He now has dysphagia.  No trouble breathing.  Afebrile at home.  Has fatigue.  No recent surgeries or procedures.  Has not used Tylenol or ibuprofen at home.  No recent antibiotic use.  Patient had a CT scan of the neck which showed submandibular area inflammation and Ludwig's angina.  No abscess.  PAST MEDICAL HISTORY:   Past Medical History:  Diagnosis Date  . H/O eye surgery     PAST SURGICAL HISTORY:  History reviewed. No pertinent surgical history.  SOCIAL HISTORY:   Social History   Tobacco Use  . Smoking status: Never Smoker  . Smokeless tobacco: Never Used  Substance Use Topics  . Alcohol use: No    FAMILY HISTORY:   Family History  Problem Relation Age of Onset  . Diabetes Father   . Seizures Father     DRUG ALLERGIES:  No Known Allergies  REVIEW OF SYSTEMS:   Review of Systems  Constitutional: Positive for malaise/fatigue. Negative for chills and fever.  HENT: Negative for sore throat.   Eyes: Negative for blurred vision, double vision and pain.  Respiratory: Negative for cough, hemoptysis, shortness of breath and wheezing.   Cardiovascular: Negative for chest pain, palpitations, orthopnea and leg swelling.  Gastrointestinal: Negative for abdominal pain, constipation, diarrhea, heartburn, nausea and vomiting.  Genitourinary: Negative for dysuria and hematuria.   Musculoskeletal: Negative for back pain and joint pain.  Skin: Negative for rash.  Neurological: Negative for sensory change, speech change, focal weakness and headaches.  Endo/Heme/Allergies: Does not bruise/bleed easily.  Psychiatric/Behavioral: Negative for depression. The patient is not nervous/anxious.     MEDICATIONS AT HOME:   Prior to Admission medications   Not on File     VITAL SIGNS:  Blood pressure 124/74, pulse 77, temperature 98.6 F (37 C), temperature source Oral, resp. rate 16, height  (1.753 m), weight 104.3 kg (230 lb), SpO2 97 %.  PHYSICAL EXAMINATION:  Physical Exam  GENERAL:  27 y.o.-year-old patient lying in the bed with no acute distress.  EYES: Pupils equal, round, reactive to light and accommodation. No scleral icterus. Extraocular muscles intact.  HEENT: Head atraumatic, normocephalic. Oropharynx and nasopharynx clear. No oropharyngeal erythema, moist oral mucosa  NECK:  Supple, no jugular venous distention.  Fullness and tenderness in the submandibular area.  No discharge. LUNGS: Normal breath sounds bilaterally, no wheezing, rales, rhonchi. No use of accessory muscles of respiration.  CARDIOVASCULAR: S1, S2 normal. No murmurs, rubs, or gallops.  ABDOMEN: Soft, nontender, nondistended. Bowel sounds present. No organomegaly or mass.  EXTREMITIES: No pedal edema, cyanosis, or clubbing. + 2 pedal & radial pulses b/l.   NEUROLOGIC: Cranial nerves II through XII are intact. No focal Motor or sensory deficits appreciated b/l PSYCHIATRIC: The patient is alert and oriented x 3. Good affect.  SKIN: No obvious rash, lesion, or ulcer.   LABORATORY PANEL:  CBC Recent Labs  Lab 02/09/18 1327  WBC 9.0  HGB 15.4  HCT 45.5  PLT 311   ------------------------------------------------------------------------------------------------------------------  Chemistries  Recent Labs  Lab 02/09/18 1327  NA 138  K 4.3  CL 104  CO2 26  GLUCOSE 108*  BUN 9   CREATININE 1.19  CALCIUM 9.5  AST 22  ALT 22  ALKPHOS 96  BILITOT 0.6   ------------------------------------------------------------------------------------------------------------------  Cardiac Enzymes No results for input(s): TROPONINI in the last 168 hours. ------------------------------------------------------------------------------------------------------------------  RADIOLOGY:  Ct Soft Tissue Neck W Contrast  Result Date: 02/09/2018 CLINICAL DATA:  Initial evaluation for acute pain and swelling inferior to jaw, pain with swallowing. EXAM: CT NECK WITH CONTRAST TECHNIQUE: Multidetector CT imaging of the neck was performed using the standard protocol following the bolus administration of intravenous contrast. CONTRAST:  75mL ISOVUE-370 IOPAMIDOL (ISOVUE-370) INJECTION 76% COMPARISON:  None available. FINDINGS: Pharynx and larynx: Oral cavity within normal limits without mass lesion or loculated fluid collection. Prominent dental carie with periapical lucency present at the left second maxillary molar. No acute inflammatory changes about the teeth themselves. Palatine tonsils symmetric and within normal limits. Parapharyngeal fat maintained. Nasopharynx normal. No retropharyngeal swelling or collection. Epiglottis normal. Vallecula clear. Remainder of the hypopharynx and supraglottic larynx within normal limits. True cords symmetric and normal. Subglottic airway clear. Salivary glands: Parotid glands within normal limits bilaterally. Submandibular glands themselves symmetric in appearance and within normal limits. Adjacent inflammatory stranding noted within the adjacent submandibular spaces and submental region. No sialolithiasis or evidence for acute sialoadenitis. Thyroid: Left lobe of thyroid hypoplastic. Thyroid otherwise unremarkable. Lymph nodes: Enlarged bilateral level IB lymph nodes measure up to 19 mm on the right and 12 mm on the left. Enlarged level I a nodes measure up to 17 mm.  Subtle hypodensity within a right submental node may reflect early necrosis (series 2, image 54). There is associated inflammatory stranding and induration within the adjacent submental region, extending into the bilateral submandibular spaces, slightly greater on the right. Thickening and irregularity of the platysmas bilaterally. Slight inferior extension findings most consistent with acute infection/cellulitis. No abscess or drainable fluid collection identified. No extension into the floor of mouth/sublingual space at this time. No other adenopathy within the neck. Vascular: Normal intravascular enhancement seen throughout the neck. Limited intracranial: Unremarkable. Visualized orbits: Visualized globes and orbital soft tissues within normal limits. Mastoids and visualized paranasal sinuses: Visualized paranasal sinuses are largely clear. Mastoids and middle ear cavities are clear. Skeleton: No acute osseous abnormality. No worrisome lytic or blastic osseous lesions. Upper chest: Visualized upper chest within normal limits. Partially visualized upper lungs are grossly clear. Other: None. IMPRESSION: 1. Enlarged bilateral level Roman numeral I/II adenopathy with associated inflammatory stranding and induration within the adjacent submental region, extending into the bilateral submandibular spaces. Findings concerning for acute infection/cellulitis/Ludwig's angina. No overt extension into the floor of mouth/sublingual space at this time. No discrete abscess or drainable fluid collection. 2. Prominent dental carie at the left second maxillary molar, felt to be unlikely as source of infection. Electronically Signed   By: Rise Mu M.D.   On: 02/09/2018 16:03   US Soft Tissue Neck  Result Date: 02/09/2018 CLINICAL DATA:  Swelling.  Mass. EXAM: ULTRASOUND OF HEAD/NECK SOFT TISSUES TECHNIQUE: Ultrasound examination of the head and neck soft tissues was performed in the area of clinical concern.  COMPARISON:  None. FINDINGS: Multiple hypoechoic lesions with increased through transmission are evident the submental region and right submandibular space. There smaller lesions  in the right lateral neck. The largest lesion is at the right submandibular space measuring 2.4 x 1.6 x 1.5 cm. IMPRESSION: 1. Multiple hypoechoic lesions. These are most compatible with necrotic or suppurative lymph nodes. Recommend CT the neck with contrast for further evaluation of these nodes and related pathologies. Electronically Signed   By: Marin Roberts M.D.   On: 02/09/2018 14:48     IMPRESSION AND PLAN:   * Ludwig's angina with dysphagia.  No trouble breathing.  Start IV antibiotics.  Pain medications added. Monitor for any airway compromise. No abscess on CT scan.  *Family history of diabetes mellitus.  Check hemoglobin A1c.  Patient does not have primary care physician.  DVT prophylaxis with Lovenox   All the records are reviewed and case discussed with ED provider. Management plans discussed with the patient, family and they are in agreement.  CODE STATUS: FULL CODE  TOTAL TIME TAKING CARE OF THIS PATIENT: 35 minutes.   Molinda Bailiff Ellis Koffler M.D on 02/09/2018 at 4:51 PM  Between 7am to 6pm - Pager - [phone]  After 6pm go to [email or web address] - password [email or web address]  [facility] [place] [facility]  Office  [phone]  CC: Primary care physician; [facility]  Note: This dictation was prepared with Dragon dictation along with smaller phrase technology. Any transcriptional errors that result from this process are unintentional.

## 2018-02-09 NOTE — ED Notes (Signed)
Waiting on disposition

## 2018-02-10 LAB — HEMOGLOBIN A1C
HEMOGLOBIN A1C: 5.8 % — AB (ref 4.8–5.6)
MEAN PLASMA GLUCOSE: 119.76 mg/dL

## 2018-02-10 MED ORDER — SODIUM CHLORIDE 0.9 % IV SOLN
3.0000 g | Freq: Four times a day (QID) | INTRAVENOUS | Status: DC
  Administered 2018-02-10 – 2018-02-11 (×5): 3 g via INTRAVENOUS
  Filled 2018-02-10 (×7): qty 3

## 2018-02-10 NOTE — Consult Note (Signed)
Pharmacy Antibiotic Note  Casey [name] is a 27 y.o. male admitted on 02/09/2018 with ludwigs angina/ jaw infection.  Pharmacy has been consulted for Unasyn dosing.  Plan: Start Unasyn 3g IV every 6 hours.   Height:  (175.3 cm) Weight: 218 lb 4.1 oz (99 kg) IBW/kg (Calculated) : 70.7  Temp (24hrs), Avg:98.1 F (36.7 C), Min:97.9 F (36.6 C), Max:98.4 F (36.9 C)  Recent Labs  Lab 02/09/18 1327  WBC 9.0  CREATININE 1.19    Estimated Creatinine Clearance: 107.2 mL/min (by C-G formula based on SCr of 1.19 mg/dL).    No Known Allergies  Antimicrobials this admission: 5/5 Unasyn >>   Microbiology results: 5/5 BCx: NG TD  Thank you for allowing pharmacy to be a part of this patient's care.  Gardner Candle, PharmD, BCPS Clinical Pharmacist 02/10/2018 2:01 PM

## 2018-02-10 NOTE — Progress Notes (Signed)
[facility] - [place] [facility]   PATIENT NAME: Casey [name]    MR#:  [id number]  DATE OF BIRTH:  01/15/1991  SUBJECTIVE:  Patient seen and evaluated today Has pain in the neck area below the chin Has some pain whenever he swallows food No shortness of breath  no difficulty breathing  REVIEW OF SYSTEMS:    ROS  CONSTITUTIONAL: No documented fever. No fatigue, weakness. No weight gain, no weight loss.  EYES: No blurry or double vision.  ENT: No tinnitus. No postnasal drip. No redness of the oropharynx.  Pain mandibular area noted RESPIRATORY: No cough, no wheeze, no hemoptysis. No dyspnea.  CARDIOVASCULAR: No chest pain. No orthopnea. No palpitations. No syncope.  GASTROINTESTINAL: No nausea, no vomiting or diarrhea. No abdominal pain. No melena or hematochezia.  GENITOURINARY: No dysuria or hematuria.  ENDOCRINE: No polyuria or nocturia. No heat or cold intolerance.  HEMATOLOGY: No anemia. No bruising. No bleeding.  INTEGUMENTARY: No rashes. No lesions.  MUSCULOSKELETAL: No arthritis. No swelling. No gout.  NEUROLOGIC: No numbness, tingling, or ataxia. No seizure-type activity.  PSYCHIATRIC: No anxiety. No insomnia. No ADD.   DRUG ALLERGIES:  No Known Allergies  VITALS:  Blood pressure 127/79, pulse 77, temperature 97.9 F (36.6 C), temperature source Oral, resp. rate 18, height  (1.753 m), weight 99 kg (218 lb 4.1 oz), SpO2 98 %.  PHYSICAL EXAMINATION:   Physical Exam  GENERAL:  27 y.o.-year-old patient lying in the bed with no acute distress.  EYES: Pupils equal, round, reactive to light and accommodation. No scleral icterus. Extraocular muscles intact.  HEENT: Head atraumatic, normocephalic. Oropharynx redness noted and nasopharynx clear.  NECK:  Supple, no jugular venous distention. No thyroid enlargement, no tenderness.  Submental and submandibular lymph nodes swollen LUNGS: Normal breath sounds bilaterally, no wheezing, rales,  rhonchi. No use of accessory muscles of respiration.  CARDIOVASCULAR: S1, S2 normal. No murmurs, rubs, or gallops.  ABDOMEN: Soft, nontender, nondistended. Bowel sounds present. No organomegaly or mass.  EXTREMITIES: No cyanosis, clubbing or edema b/l.    NEUROLOGIC: Cranial nerves II through XII are intact. No focal Motor or sensory deficits b/l.   PSYCHIATRIC: The patient is alert and oriented x 3.  SKIN: No obvious rash, lesion, or ulcer.   LABORATORY PANEL:   CBC Recent Labs  Lab 02/09/18 1327  WBC 9.0  HGB 15.4  HCT 45.5  PLT 311   ------------------------------------------------------------------------------------------------------------------ Chemistries  Recent Labs  Lab 02/09/18 1327  NA 138  K 4.3  CL 104  CO2 26  GLUCOSE 108*  BUN 9  CREATININE 1.19  CALCIUM 9.5  AST 22  ALT 22  ALKPHOS 96  BILITOT 0.6   ------------------------------------------------------------------------------------------------------------------  Cardiac Enzymes No results for input(s): TROPONINI in the last 168 hours. ------------------------------------------------------------------------------------------------------------------  RADIOLOGY:  Ct Soft Tissue Neck W Contrast  Result Date: 02/09/2018 CLINICAL DATA:  Initial evaluation for acute pain and swelling inferior to jaw, pain with swallowing. EXAM: CT NECK WITH CONTRAST TECHNIQUE: Multidetector CT imaging of the neck was performed using the standard protocol following the bolus administration of intravenous contrast. CONTRAST:  75mL ISOVUE-370 IOPAMIDOL (ISOVUE-370) INJECTION 76% COMPARISON:  None available. FINDINGS: Pharynx and larynx: Oral cavity within normal limits without mass lesion or loculated fluid collection. Prominent dental carie with periapical lucency present at the left second maxillary molar. No acute inflammatory changes about the teeth themselves. Palatine tonsils symmetric and within normal limits. Parapharyngeal  fat maintained. Nasopharynx normal. No retropharyngeal swelling or collection.  Epiglottis normal. Vallecula clear. Remainder of the hypopharynx and supraglottic larynx within normal limits. True cords symmetric and normal. Subglottic airway clear. Salivary glands: Parotid glands within normal limits bilaterally. Submandibular glands themselves symmetric in appearance and within normal limits. Adjacent inflammatory stranding noted within the adjacent submandibular spaces and submental region. No sialolithiasis or evidence for acute sialoadenitis. Thyroid: Left lobe of thyroid hypoplastic. Thyroid otherwise unremarkable. Lymph nodes: Enlarged bilateral level IB lymph nodes measure up to 19 mm on the right and 12 mm on the left. Enlarged level I a nodes measure up to 17 mm. Subtle hypodensity within a right submental node may reflect early necrosis (series 2, image 54). There is associated inflammatory stranding and induration within the adjacent submental region, extending into the bilateral submandibular spaces, slightly greater on the right. Thickening and irregularity of the platysmas bilaterally. Slight inferior extension findings most consistent with acute infection/cellulitis. No abscess or drainable fluid collection identified. No extension into the floor of mouth/sublingual space at this time. No other adenopathy within the neck. Vascular: Normal intravascular enhancement seen throughout the neck. Limited intracranial: Unremarkable. Visualized orbits: Visualized globes and orbital soft tissues within normal limits. Mastoids and visualized paranasal sinuses: Visualized paranasal sinuses are largely clear. Mastoids and middle ear cavities are clear. Skeleton: No acute osseous abnormality. No worrisome lytic or blastic osseous lesions. Upper chest: Visualized upper chest within normal limits. Partially visualized upper lungs are grossly clear. Other: None. IMPRESSION: 1. Enlarged bilateral level Roman numeral  I/II adenopathy with associated inflammatory stranding and induration within the adjacent submental region, extending into the bilateral submandibular spaces. Findings concerning for acute infection/cellulitis/Ludwig's angina. No overt extension into the floor of mouth/sublingual space at this time. No discrete abscess or drainable fluid collection. 2. Prominent dental carie at the left second maxillary molar, felt to be unlikely as source of infection. Electronically Signed   By: Rise Mu M.D.   On: 02/09/2018 16:03   US Soft Tissue Neck  Result Date: 02/09/2018 CLINICAL DATA:  Swelling.  Mass. EXAM: ULTRASOUND OF HEAD/NECK SOFT TISSUES TECHNIQUE: Ultrasound examination of the head and neck soft tissues was performed in the area of clinical concern. COMPARISON:  None. FINDINGS: Multiple hypoechoic lesions with increased through transmission are evident the submental region and right submandibular space. There smaller lesions in the right lateral neck. The largest lesion is at the right submandibular space measuring 2.4 x 1.6 x 1.5 cm. IMPRESSION: 1. Multiple hypoechoic lesions. These are most compatible with necrotic or suppurative lymph nodes. Recommend CT the neck with contrast for further evaluation of these nodes and related pathologies. Electronically Signed   By: Marin Roberts M.D.   On: 02/09/2018 14:48     ASSESSMENT AND PLAN:  27 year old male patient currently under service for swelling and redness in the submandibular area with difficulty swallowing food.  1.  Ludwig's angina Continue IV Unasyn antibiotic  monitor for any airway compromise CT neck showed no abscess  2.  Dysphagia secondary to Ludwig's angina Patient will to tolerate oral diet Symptomatic pain management  3.  Inflammatory lymphadenopathy submandibular and submental Continue IV antibiotics   4.DVT prophylaxis with subcu Lovenox daily     All the records are reviewed and case discussed with Care  Management/Social Worker. Management plans discussed with the patient, family and they are in agreement.  CODE STATUS: Full code  DVT Prophylaxis: SCDs  TOTAL TIME TAKING CARE OF THIS PATIENT: 36 minutes.   POSSIBLE D/C IN 1 DAYS, DEPENDING ON CLINICAL CONDITION.  Ihor Austin M.D on 02/10/2018 at 11:59 AM  Between 7am to 6pm - Pager - [phone]  After 6pm go to [email or web address] - password [email or web address]  [facility] [place] [facility]  Office  [phone]  CC: Primary care physician; [facility]  Note: This dictation was prepared with Dragon dictation along with smaller phrase technology. Any transcriptional errors that result from this process are unintentional.

## 2018-02-10 NOTE — Progress Notes (Signed)
Advanced care plan.  Purpose of the Encounter: CODE STATUS  Parties in Attendance:Patient  Patient's Decision Capacity:Good Subjective/Patient's story: Presented with swelling, redness in the neck area and throat Objective/Medical story Has inflammatory swelling of the lymph nodes submandibular and submental area Has Ludwig's angina Goals of care determination:  Advanced directives discussed Patient wants everything done for now which includes cardiac resuscitation, intubation and ventilator if the need arises CODE STATUS: Full code Time spent discussing advanced care planning: 16 minutes

## 2018-02-11 LAB — HIV ANTIBODY (ROUTINE TESTING W REFLEX): HIV Screen 4th Generation wRfx: NONREACTIVE

## 2018-02-11 MED ORDER — OXYCODONE-ACETAMINOPHEN 5-325 MG PO TABS: 1.0000 | ORAL_TABLET | Freq: Four times a day (QID) | ORAL | 0 refills | Status: AC | PRN

## 2018-02-11 MED ORDER — AMOXICILLIN-POT CLAVULANATE 875-125 MG PO TABS: 1.0000 | ORAL_TABLET | Freq: Two times a day (BID) | ORAL | 0 refills | Status: AC

## 2018-02-11 NOTE — Discharge Summary (Signed)
[employer] [facility] [place] at [facility]   PATIENT NAME: Casey [name]    MR#:  [id number]  DATE OF BIRTH:  23-Oct-1990  DATE OF ADMISSION:  02/09/2018 ADMITTING PHYSICIAN: Milagros Loll, MD  DATE OF DISCHARGE: 02/11/2018 12:22 PM  PRIMARY CARE PHYSICIAN: [facility]   ADMISSION DIAGNOSIS:  Ludwig's angina Dysphagia  DISCHARGE DIAGNOSIS:  Ludwig's angina Dysphagia improved  SECONDARY DIAGNOSIS:   Past Medical History:  Diagnosis Date  . H/O eye surgery      ADMITTING HISTORY Casey [name]  is a 27 y.o. male with family history of diabetes mellitus presents to the emergency room with 1 week of redness and swelling in the submandibular area.  This has slowly worsened.  He now has dysphagia.  No trouble breathing.  Afebrile at home.  Has fatigue.  No recent surgeries or procedures.  Has not used Tylenol or ibuprofen at home.  No recent antibiotic use.  Patient had a CT scan of the neck which showed submandibular area inflammation and Ludwig's angina.  No abscess.  HOSPITAL COURSE:  Patient was admitted to medical floor.  CT scan of the neck did not show any abscess.  There was inflammation in the submandibular area with reactive lymphadenopathy.  Patient was started on IV Unasyn antibiotic.  Submandibular lymphadenopathy swelling improved.  Pain in the throat also improved.  No fevers.  Patient tolerated oral diet well no trouble breathing.  Blood cultures did not reveal any growth.  Patient hemodynamically stable will be discharged home and follow-up with primary care physician in the clinic patient will be discharged on oral Augmentin antibiotic.  CONSULTS OBTAINED:    DRUG ALLERGIES:  No Known Allergies  DISCHARGE MEDICATIONS:   Allergies as of 02/11/2018   No Known Allergies     Medication List    STOP taking these medications   brompheniramine-pseudoephedrine-DM 30-2-10 MG/5ML syrup     TAKE these medications    amoxicillin-clavulanate 875-125 MG tablet Commonly known as:  AUGMENTIN Take 1 tablet by mouth 2 (two) times daily for 7 days.   oxyCODONE-acetaminophen 5-325 MG tablet Commonly known as:  PERCOCET Take 1 tablet by mouth every 6 (six) hours as needed for up to 3 days for severe pain.       Today   VITAL SIGNS:  Blood pressure 113/76, pulse 72, temperature 97.7 F (36.5 C), temperature source Oral, resp. rate 16, height  (1.753 m), weight 103.9 kg (229 lb 1.6 oz), SpO2 99 %.  I/O:    Intake/Output Summary (Last 24 hours) at 02/11/2018 1437 Last data filed at 02/10/2018 1838 Gross per 24 hour  Intake 340 ml  Output 300 ml  Net 40 ml    PHYSICAL EXAMINATION:  Physical Exam  GENERAL:  27 y.o.-year-old patient lying in the bed with no acute distress.  LUNGS: Normal breath sounds bilaterally, no wheezing, rales,rhonchi or crepitation. No use of accessory muscles of respiration.  CARDIOVASCULAR: S1, S2 normal. No murmurs, rubs, or gallops.  ABDOMEN: Soft, non-tender, non-distended. Bowel sounds present. No organomegaly or mass.  NEUROLOGIC: Moves all 4 extremities. PSYCHIATRIC: The patient is alert and oriented x 3.  SKIN: No obvious rash, lesion, or ulcer.   DATA REVIEW:   CBC Recent Labs  Lab 02/09/18 1327  WBC 9.0  HGB 15.4  HCT 45.5  PLT 311    Chemistries  Recent Labs  Lab 02/09/18 1327  NA 138  K 4.3  CL 104  CO2 26  GLUCOSE 108*  BUN 9  CREATININE 1.19  CALCIUM 9.5  AST 22  ALT 22  ALKPHOS 96  BILITOT 0.6    Cardiac Enzymes No results for input(s): TROPONINI in the last 168 hours.  Microbiology Results  Results for orders placed or performed during the hospital encounter of 02/09/18  Culture, blood (routine x 2)     Status: None (Preliminary result)   Collection Time: 02/09/18  4:38 PM  Result Value Ref Range Status   Specimen Description BLOOD L AC  Final   Special Requests   Final    BOTTLES DRAWN AEROBIC AND ANAEROBIC Blood Culture  adequate volume   Culture   Final    NO GROWTH 2 DAYS Performed at [facility], [address] [place], [place] [address]    Report Status PENDING  Incomplete  Culture, blood (routine x 2)     Status: None (Preliminary result)   Collection Time: 02/09/18  4:38 PM  Result Value Ref Range Status   Specimen Description   Final    BLOOD Blood Culture results may not be optimal due to an excessive volume of blood received in culture bottles   Special Requests   Final    BOTTLES DRAWN AEROBIC AND ANAEROBIC RIGHT ANTECUBITAL   Culture   Final    NO GROWTH 2 DAYS Performed at [facility], [address] [place], [place] [address]    Report Status PENDING  Incomplete    RADIOLOGY:  Ct Soft Tissue Neck W Contrast  Result Date: 02/09/2018 CLINICAL DATA:  Initial evaluation for acute pain and swelling inferior to jaw, pain with swallowing. EXAM: CT NECK WITH CONTRAST TECHNIQUE: Multidetector CT imaging of the neck was performed using the standard protocol following the bolus administration of intravenous contrast. CONTRAST:  75mL ISOVUE-370 IOPAMIDOL (ISOVUE-370) INJECTION 76% COMPARISON:  None available. FINDINGS: Pharynx and larynx: Oral cavity within normal limits without mass lesion or loculated fluid collection. Prominent dental carie with periapical lucency present at the left second maxillary molar. No acute inflammatory changes about the teeth themselves. Palatine tonsils symmetric and within normal limits. Parapharyngeal fat maintained. Nasopharynx normal. No retropharyngeal swelling or collection. Epiglottis normal. Vallecula clear. Remainder of the hypopharynx and supraglottic larynx within normal limits. True cords symmetric and normal. Subglottic airway clear. Salivary glands: Parotid glands within normal limits bilaterally. Submandibular glands themselves symmetric in appearance and within normal limits. Adjacent inflammatory stranding noted within the adjacent  submandibular spaces and submental region. No sialolithiasis or evidence for acute sialoadenitis. Thyroid: Left lobe of thyroid hypoplastic. Thyroid otherwise unremarkable. Lymph nodes: Enlarged bilateral level IB lymph nodes measure up to 19 mm on the right and 12 mm on the left. Enlarged level I a nodes measure up to 17 mm. Subtle hypodensity within a right submental node may reflect early necrosis (series 2, image 54). There is associated inflammatory stranding and induration within the adjacent submental region, extending into the bilateral submandibular spaces, slightly greater on the right. Thickening and irregularity of the platysmas bilaterally. Slight inferior extension findings most consistent with acute infection/cellulitis. No abscess or drainable fluid collection identified. No extension into the floor of mouth/sublingual space at this time. No other adenopathy within the neck. Vascular: Normal intravascular enhancement seen throughout the neck. Limited intracranial: Unremarkable. Visualized orbits: Visualized globes and orbital soft tissues within normal limits. Mastoids and visualized paranasal sinuses: Visualized paranasal sinuses are largely clear. Mastoids and middle ear cavities are clear. Skeleton: No acute osseous abnormality. No worrisome lytic or blastic osseous  lesions. Upper chest: Visualized upper chest within normal limits. Partially visualized upper lungs are grossly clear. Other: None. IMPRESSION: 1. Enlarged bilateral level Roman numeral I/II adenopathy with associated inflammatory stranding and induration within the adjacent submental region, extending into the bilateral submandibular spaces. Findings concerning for acute infection/cellulitis/Ludwig's angina. No overt extension into the floor of mouth/sublingual space at this time. No discrete abscess or drainable fluid collection. 2. Prominent dental carie at the left second maxillary molar, felt to be unlikely as source of infection.  Electronically Signed   By: Rise Mu M.D.   On: 02/09/2018 16:03    Follow up with PCP in 1 week.  Management plans discussed with the patient, family and they are in agreement.  CODE STATUS: Full code    Code Status Orders  (From admission, onward)        Start     Ordered   02/09/18 1651  Full code  Continuous     02/09/18 1650    Code Status History    This patient has a current code status but no historical code status.      TOTAL TIME TAKING CARE OF THIS PATIENT ON DAY OF DISCHARGE: more than 35 minutes.   Ihor Austin M.D on 02/11/2018 at 2:37 PM  Between 7am to 6pm - Pager - [phone]  After 6pm go to [email or web address] - password [email or web address]  [facility]  Office  [phone]  CC: Primary care physician; [facility]  Note: This dictation was prepared with Dragon dictation along with smaller phrase technology. Any transcriptional errors that result from this process are unintentional.

## 2018-02-14 LAB — CULTURE, BLOOD (ROUTINE X 2)
Culture: NO GROWTH
Culture: NO GROWTH
Special Requests: ADEQUATE

## 2019-02-03 ENCOUNTER — Emergency Department: Payer: Medicaid Other

## 2019-02-03 ENCOUNTER — Emergency Department
Admission: EM | Admit: 2019-02-03 | Discharge: 2019-02-03 | Disposition: A | Discharge status: Home / Self Care | Payer: Medicaid Other | Attending: Emergency Medicine | Admitting: Emergency Medicine

## 2019-02-03 ENCOUNTER — Encounter: Payer: Self-pay | Admitting: Emergency Medicine

## 2019-02-03 ENCOUNTER — Other Ambulatory Visit: Payer: Self-pay

## 2019-02-03 DIAGNOSIS — R1031 Right lower quadrant pain: Secondary | ICD-10-CM

## 2019-02-03 DIAGNOSIS — K625 Hemorrhage of anus and rectum: Secondary | ICD-10-CM | POA: Diagnosis not present

## 2019-02-03 DIAGNOSIS — R1030 Lower abdominal pain, unspecified: Secondary | ICD-10-CM | POA: Diagnosis present

## 2019-02-03 LAB — URINALYSIS, COMPLETE (UACMP) WITH MICROSCOPIC
Bacteria, UA: NONE SEEN
Bilirubin Urine: NEGATIVE
Glucose, UA: NEGATIVE mg/dL
Hgb urine dipstick: NEGATIVE
Ketones, ur: NEGATIVE mg/dL
Leukocytes,Ua: NEGATIVE
Nitrite: NEGATIVE
Protein, ur: NEGATIVE mg/dL
Specific Gravity, Urine: 1.025 (ref 1.005–1.030)
Squamous Epithelial / HPF: NONE SEEN (ref 0–5)
pH: 5 (ref 5.0–8.0)

## 2019-02-03 LAB — CBC
HCT: 46 % (ref 39.0–52.0)
Hemoglobin: 15.4 g/dL (ref 13.0–17.0)
MCH: 29.9 pg (ref 26.0–34.0)
MCHC: 33.5 g/dL (ref 30.0–36.0)
MCV: 89.3 fL (ref 80.0–100.0)
Platelets: 333 10*3/uL (ref 150–400)
RBC: 5.15 MIL/uL (ref 4.22–5.81)
RDW: 12.2 % (ref 11.5–15.5)
WBC: 6.9 10*3/uL (ref 4.0–10.5)
nRBC: 0 % (ref 0.0–0.2)

## 2019-02-03 LAB — COMPREHENSIVE METABOLIC PANEL
ALT: 24 U/L (ref 0–44)
AST: 24 U/L (ref 15–41)
Albumin: 4.5 g/dL (ref 3.5–5.0)
Alkaline Phosphatase: 91 U/L (ref 38–126)
Anion gap: 9 (ref 5–15)
BUN: 10 mg/dL (ref 6–20)
CO2: 23 mmol/L (ref 22–32)
Calcium: 9.2 mg/dL (ref 8.9–10.3)
Chloride: 105 mmol/L (ref 98–111)
Creatinine, Ser: 1.13 mg/dL (ref 0.61–1.24)
GFR calc Af Amer: >60 mL/min (ref >60)
GFR calc non Af Amer: >60 mL/min (ref >60)
Glucose, Bld: 96 mg/dL (ref 70–99)
Potassium: 3.8 mmol/L (ref 3.5–5.1)
Sodium: 137 mmol/L (ref 135–145)
Total Bilirubin: 0.8 mg/dL (ref 0.3–1.2)
Total Protein: 7.7 g/dL (ref 6.5–8.1)

## 2019-02-03 LAB — LIPASE, BLOOD: Lipase: 29 U/L (ref 11–51)

## 2019-02-03 MED ORDER — IOHEXOL 300 MG/ML  SOLN
100.0000 mL | Freq: Once | INTRAMUSCULAR | Status: AC | PRN
  Administered 2019-02-03: 16:00:00 100 mL via INTRAVENOUS

## 2019-02-03 MED ORDER — ACETAMINOPHEN 500 MG PO TABS: 1000.0000 mg | ORAL_TABLET | Freq: Once | ORAL | Status: DC

## 2019-02-03 MED ORDER — SODIUM CHLORIDE 0.9% FLUSH: 3.0000 mL | Freq: Once | INTRAVENOUS | Status: DC

## 2019-02-03 MED ORDER — ACETAMINOPHEN 500 MG PO TABS
1000.0000 mg | ORAL_TABLET | Freq: Once | ORAL | Status: AC
  Administered 2019-02-03: 18:00:00 1000 mg via ORAL
  Filled 2019-02-03: qty 2

## 2019-02-03 NOTE — Discharge Instructions (Addendum)
Follow-up with your regular doctor if not improving in 3 to 5 days.  Return emergency department worsening.

## 2019-02-03 NOTE — ED Triage Notes (Signed)
Lower abdominal pain x 1 month. States has had similar episodes over past year, approx 5 times. States has had reddish black stools.

## 2019-02-03 NOTE — ED Triage Notes (Signed)
Pt in via EMS from home with c/o abd pain, rectal bleeding and dark stools for the past 3 weeks. Pt reports pain is right lower and EMS reports pt tender. EMS VS 110HR, 98% RA, 161/101, 98.4t Oral, 99 glucose.

## 2019-02-03 NOTE — ED Notes (Signed)

## 2019-02-03 NOTE — ED Provider Notes (Signed)
[facility] Emergency Department Provider Note  ____________________________________________   None    (approximate)  I have reviewed the triage vital signs and the nursing notes.   HISTORY  Chief Complaint Rectal Bleeding and Abdominal Pain    HPI Casey [name] is a 28 y.o. male presents emergency department complaining of lower abdominal pain for approximately 1 month.  He states that his stools are bright red.  He does have some blood on the toilet paper.  Similar episodes over the past year approximately 5 times.  However now his right lower quadrant is hurting.  Hurts to walk.  He denies any fever or chills.  No vomiting or diarrhea.    Past Medical History:  Diagnosis Date  . H/O eye surgery     Patient Active Problem List   Diagnosis Date Noted  . 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 No pertinent surgical history.  Prior to Admission medications   Medication Sig Start Date End Date Taking? Authorizing Provider  hydrOXYzine (VISTARIL) 50 MG capsule Take 50 mg by mouth at bedtime as needed for sleep or anxiety. 10/04/18   [provider]  LATUDA 40 MG TABS tablet Take 40 mg by mouth at bedtime. 10/04/18   [provider]  VYVANSE 30 MG capsule Take 30 mg by mouth every morning. 10/04/18   [provider]    Allergies Patient has no known allergies.  Family History  Problem Relation Age of Onset  . Diabetes Father   . Seizures Father     Social History Social History   Tobacco Use  . Smoking status: Never Smoker  . Smokeless tobacco: Never Used  Substance Use Topics  . Alcohol use: No  . Drug use: No    Review of Systems  Constitutional: No fever/chills Eyes: No visual changes. ENT: No sore throat. Respiratory: Denies cough Gastrointestinal: Positive for right lower quadrant pain and rectal bleeding Genitourinary: Negative for dysuria. Musculoskeletal: Negative for back pain.  Skin: Negative for rash.    ____________________________________________   PHYSICAL EXAM:  VITAL SIGNS: ED Triage Vitals  Enc Vitals Group     BP 02/03/19 1411 (!) 136/99     Pulse Rate 02/03/19 1411 97     Resp 02/03/19 1411 18     Temp 02/03/19 1411 97.9 F (36.6 C)     Temp Source 02/03/19 1411 Oral     SpO2 02/03/19 1411 96 %     Weight 02/03/19 1412 230 lb (104.3 kg)     Height 02/03/19 1412 5\' 10"  (1.778 m)     Head Circumference --      Peak Flow --      Pain Score 02/03/19 1412 10     Pain Loc --      Pain Edu? --      Excl. in GC? --     Constitutional: Alert and oriented. Well appearing and in no acute distress. Eyes: Conjunctivae are normal.  Head: Atraumatic. Nose: No congestion/rhinnorhea. Mouth/Throat: Mucous membranes are moist.   Neck:  supple no lymphadenopathy noted Cardiovascular: Normal rate, regular rhythm. Heart sounds are normal Respiratory: Normal respiratory effort.  No retractions, lungs c t a  Abd: soft tender in lower quadrants bilaterally, more so in the right lower quadrant Bs normal all 4 quad Rectal: Rectal exam does not show any external hemorrhoids, guaiac test is negative GU: deferred Musculoskeletal: FROM all extremities, warm and well perfused Neurologic:  Normal speech and language.  Skin:  Skin is warm, dry and intact. No rash noted. Psychiatric: Mood and affect are normal. Speech and behavior are normal.  ____________________________________________   LABS (all labs ordered are listed, but only abnormal results are displayed)  Labs Reviewed  URINALYSIS, COMPLETE (UACMP) WITH MICROSCOPIC - Abnormal; Notable for the following components:      Result Value   Color, Urine YELLOW (*)    APPearance CLEAR (*)    All other components within normal limits  LIPASE, BLOOD  COMPREHENSIVE METABOLIC PANEL  CBC   ____________________________________________   ____________________________________________  RADIOLOGY  CT  abdomen pelvis with IV contrast is normal  ____________________________________________   PROCEDURES  Procedure(s) performed: No  Procedures    ____________________________________________   INITIAL IMPRESSION / ASSESSMENT AND PLAN / ED COURSE  Pertinent labs & imaging results that were available during my care of the patient were reviewed by me and considered in my medical decision making (see chart for details).   Patient is 28 year old male presents emergency department complaining of lower abdominal pain along with rectal bleeding.  Physical exam patient appears well.  Abdomen is tender in the lower quadrants bilaterally with increased in the right lower quadrant.  Rectal exam does not show any hemorrhoids.  Guaiac test is negative  UA, lipase, CBC and comprehensive metabolic panel ordered  UA lipase and CBC are normal, comprehensive metabolic panel is normal,  CT abdomen pelvis does not show any abnormality.  Discussed findings with patient.  Dr. Scotty Court to see the patient.  Patient will be discharged and referred to GI.  Patient states he understands all instructions and will comply.  Was discharged stable condition.  As part of my medical decision making, I reviewed the following data within the electronic MEDICAL RECORD NUMBER Nursing notes reviewed and incorporated, Labs reviewed see above, Old chart reviewed, Radiograph reviewed CT abdomen/pelvis is negative, Evaluated by EM attending Dr. Scotty Court, Notes from prior ED visits and [place] Controlled Substance Database  ____________________________________________   FINAL CLINICAL IMPRESSION(S) / ED DIAGNOSES  Final diagnoses:  Rectal bleeding  Right lower quadrant abdominal pain      NEW MEDICATIONS STARTED DURING THIS VISIT:  Discharge Medication List as of 02/03/2019  5:55 PM       Note:  This document was prepared using Dragon voice recognition software and may include unintentional dictation errors.     Faythe Ghee, PA-C 02/03/19 2040    Sharman Cheek, MD 02/03/19 2040

## 2019-03-11 IMAGING — CR DG HAND COMPLETE 3+V*R*
3 series · 3 of 3 positions shown · non-contrast
Comparison: None.

CLINICAL DATA: 26-year-old male with right hand pain and
laceration.

EXAM:
RIGHT HAND - COMPLETE 3+ VIEW

[hand ap]
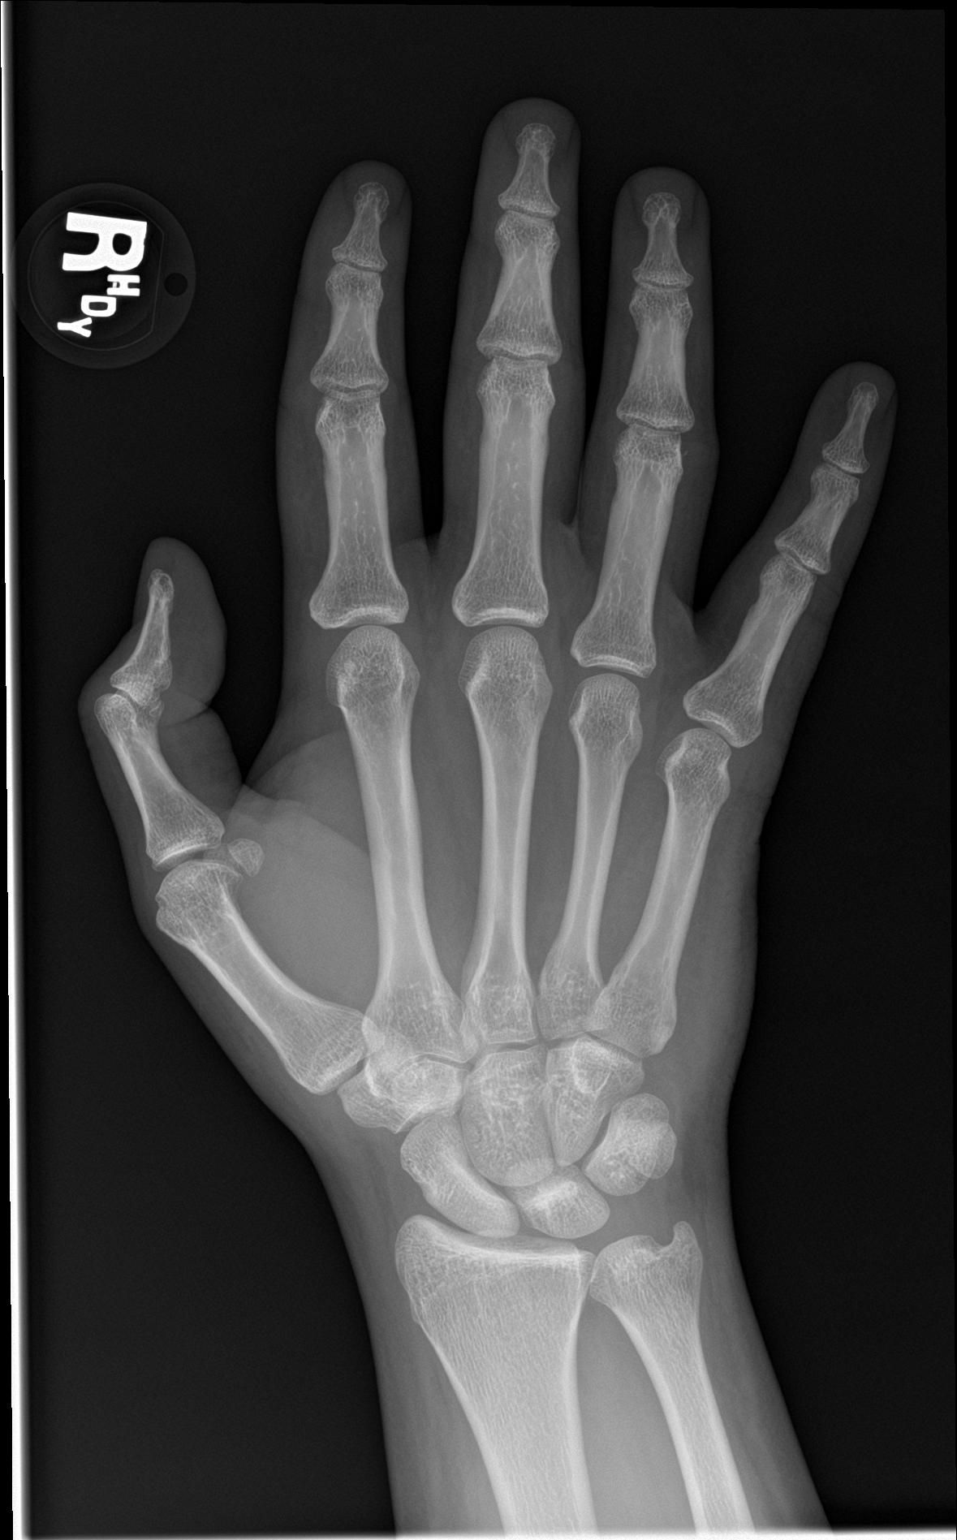

[hand obl]
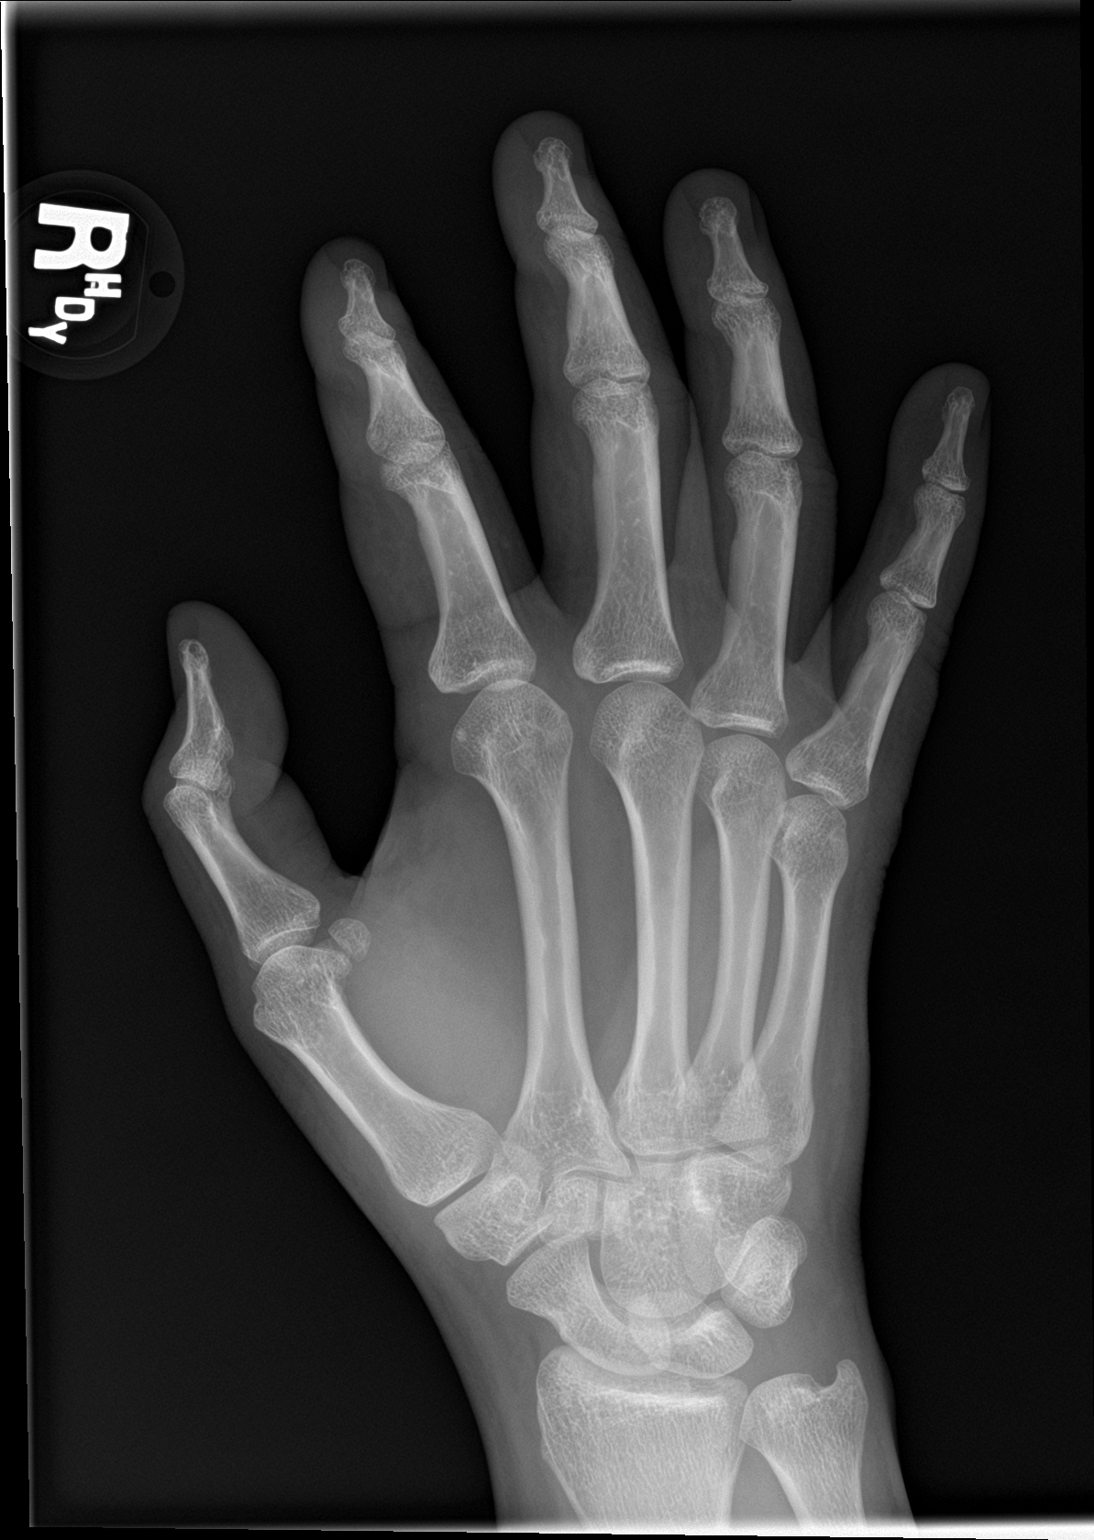

[hand lat]
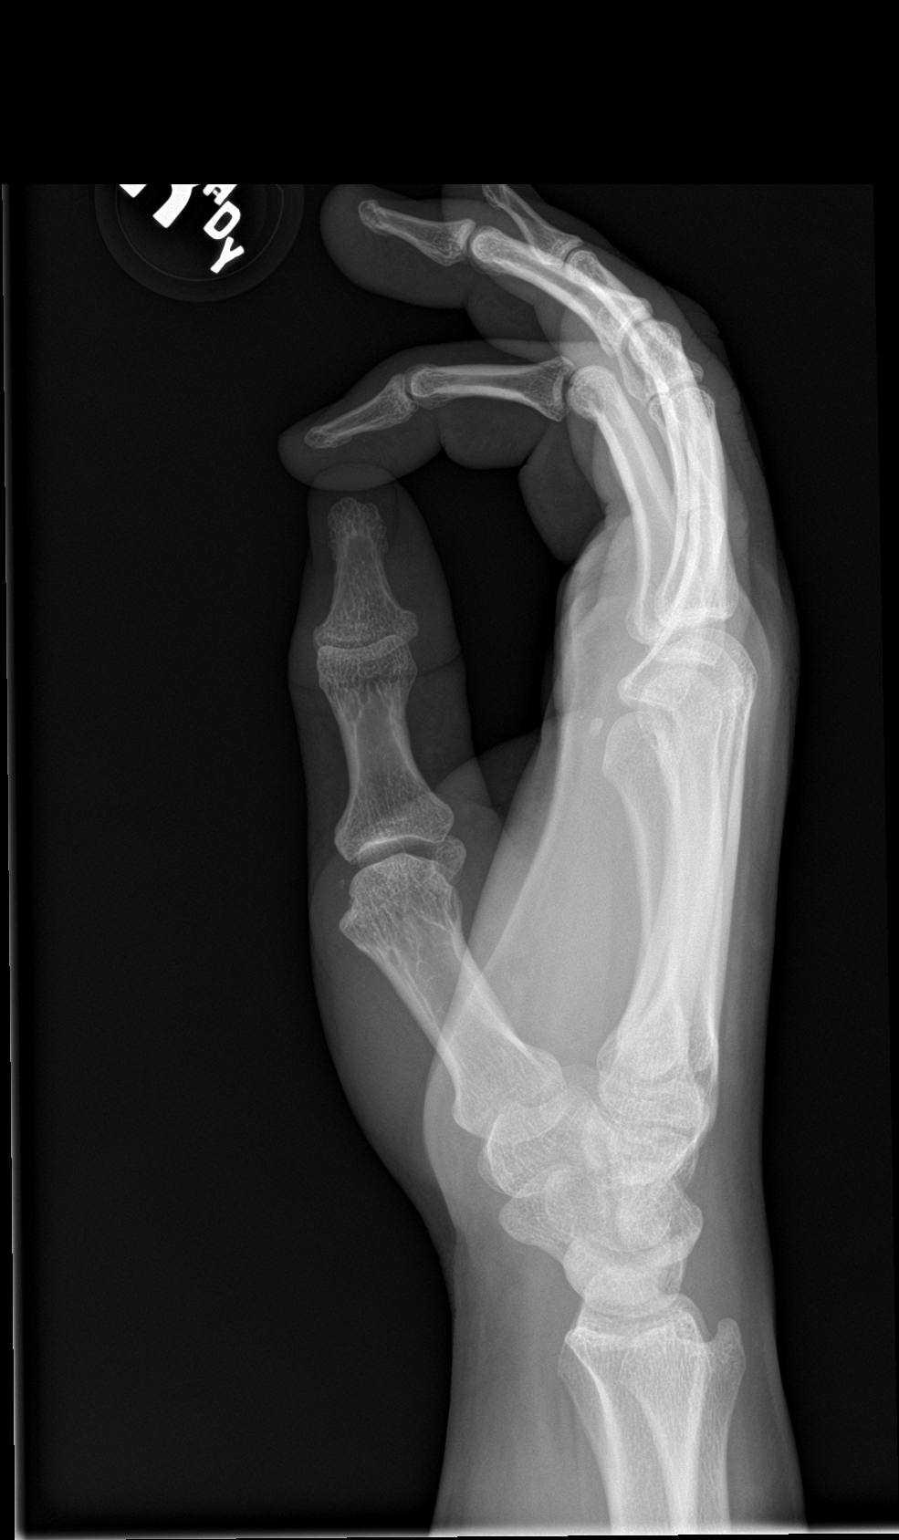

[3 of 3 positions shown; findings below may reference images not displayed]

FINDINGS: No evidence of fracture or retained radiopaque foreign body. The
visualized bones and joints are unremarkable. Normal mineralization.
No lytic or blastic osseous lesion.
IMPRESSION: Negative.

## 2019-03-18 ENCOUNTER — Telehealth: Payer: Self-pay | Admitting: Gastroenterology

## 2019-03-18 ENCOUNTER — Encounter: Payer: Self-pay | Admitting: Gastroenterology

## 2019-03-18 NOTE — Telephone Encounter (Signed)
Unable to reach pt to schedule Er f/u letter sent

## 2019-04-04 ENCOUNTER — Other Ambulatory Visit: Payer: Self-pay

## 2019-04-04 ENCOUNTER — Emergency Department
Admission: EM | Admit: 2019-04-04 | Discharge: 2019-04-04 | Disposition: A | Discharge status: Home / Self Care | Payer: Medicaid Other | Attending: Emergency Medicine | Admitting: Emergency Medicine

## 2019-04-04 ENCOUNTER — Encounter: Payer: Self-pay | Admitting: Emergency Medicine

## 2019-04-04 DIAGNOSIS — F419 Anxiety disorder, unspecified: Secondary | ICD-10-CM | POA: Diagnosis not present

## 2019-04-04 DIAGNOSIS — R0789 Other chest pain: Secondary | ICD-10-CM | POA: Diagnosis not present

## 2019-04-04 DIAGNOSIS — Z79899 Other long term (current) drug therapy: Secondary | ICD-10-CM | POA: Diagnosis not present

## 2019-04-04 DIAGNOSIS — R079 Chest pain, unspecified: Secondary | ICD-10-CM

## 2019-04-04 DIAGNOSIS — F17200 Nicotine dependence, unspecified, uncomplicated: Secondary | ICD-10-CM | POA: Insufficient documentation

## 2019-04-04 LAB — COMPREHENSIVE METABOLIC PANEL
ALT: 20 U/L (ref 0–44)
AST: 20 U/L (ref 15–41)
Albumin: 4.4 g/dL (ref 3.5–5.0)
Alkaline Phosphatase: 73 U/L (ref 38–126)
Anion gap: 15 (ref 5–15)
BUN: 8 mg/dL (ref 6–20)
CO2: 18 mmol/L — ABNORMAL LOW (ref 22–32)
Calcium: 9.4 mg/dL (ref 8.9–10.3)
Chloride: 106 mmol/L (ref 98–111)
Creatinine, Ser: 0.95 mg/dL (ref 0.61–1.24)
GFR calc Af Amer: >60 mL/min (ref >60)
GFR calc non Af Amer: >60 mL/min (ref >60)
Glucose, Bld: 117 mg/dL — ABNORMAL HIGH (ref 70–99)
Potassium: 3 mmol/L — ABNORMAL LOW (ref 3.5–5.1)
Sodium: 139 mmol/L (ref 135–145)
Total Bilirubin: 0.5 mg/dL (ref 0.3–1.2)
Total Protein: 7.8 g/dL (ref 6.5–8.1)

## 2019-04-04 LAB — CBC WITH DIFFERENTIAL/PLATELET
Abs Immature Granulocytes: 0.03 10*3/uL (ref 0.00–0.07)
Basophils Absolute: 0.1 10*3/uL (ref 0.0–0.1)
Basophils Relative: 1 %
Eosinophils Absolute: 0.1 10*3/uL (ref 0.0–0.5)
Eosinophils Relative: 2 %
HCT: 43.1 % (ref 39.0–52.0)
Hemoglobin: 14.7 g/dL (ref 13.0–17.0)
Immature Granulocytes: 0 %
Lymphocytes Relative: 35 %
Lymphs Abs: 2.9 10*3/uL (ref 0.7–4.0)
MCH: 29.7 pg (ref 26.0–34.0)
MCHC: 34.1 g/dL (ref 30.0–36.0)
MCV: 87.1 fL (ref 80.0–100.0)
Monocytes Absolute: 0.3 10*3/uL (ref 0.1–1.0)
Monocytes Relative: 4 %
Neutro Abs: 4.8 10*3/uL (ref 1.7–7.7)
Neutrophils Relative %: 58 %
Platelets: 329 10*3/uL (ref 150–400)
RBC: 4.95 MIL/uL (ref 4.22–5.81)
RDW: 12.2 % (ref 11.5–15.5)
WBC: 8.1 10*3/uL (ref 4.0–10.5)
nRBC: 0 % (ref 0.0–0.2)

## 2019-04-04 LAB — TROPONIN I (HIGH SENSITIVITY): Troponin I (High Sensitivity): 5 ng/L (ref <18)

## 2019-04-04 MED ORDER — HYDROXYZINE HCL 25 MG PO TABS: 25.0000 mg | ORAL_TABLET | Freq: Three times a day (TID) | ORAL | 0 refills | Status: DC | PRN

## 2019-04-04 MED ORDER — LORAZEPAM 1 MG PO TABS
1.0000 mg | ORAL_TABLET | Freq: Once | ORAL | Status: AC
  Administered 2019-04-04: 1 mg via ORAL
  Filled 2019-04-04: qty 1

## 2019-04-04 NOTE — ED Triage Notes (Addendum)
Pt to traige via w/c, brought in by EMS for ?anxiety; out of meds "for awhile"; pt anxious, hyperventilating, holding rt side of chest; st x 2 days his "heart hurts, hard to breathe, and having panic attack"

## 2019-04-04 NOTE — ED Notes (Signed)
Pt given warm blanket by triage RN  Pt ambulating in lobby without difficulty

## 2019-04-04 NOTE — ED Provider Notes (Signed)
[facility] Emergency Department Provider Note  Time seen: 8:10 AM  I have reviewed the triage vital signs and the nursing notes.   HISTORY  Chief Complaint Chest Pain   HPI Casey [name] is a 28 y.o. [name] with a past medical history of anxiety presents emergency department for chest discomfort.  According to the patient to call this morning he developed left-sided chest discomfort and had a "panic attack."  According to the patient he has had panic attacks in the past that typically present with chest pain.  Patient states he used to take hydroxyzine but ran out of the medication.  Patient states his symptoms have largely improved but continues to have mild chest discomfort at this time continues to feel anxious per patient.  Denies any shortness of breath no cough congestion or fever.  Largely negative review of systems otherwise.   Past Medical History:  Diagnosis Date  . H/O eye surgery     Patient Active Problem List   Diagnosis Date Noted  . 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 No pertinent surgical history.  Prior to Admission medications   Medication Sig Start Date End Date Taking? Authorizing Provider  hydrOXYzine (VISTARIL) 50 MG capsule Take 50 mg by mouth at bedtime as needed for sleep or anxiety. 10/04/18   [provider]  LATUDA 40 MG TABS tablet Take 40 mg by mouth at bedtime. 10/04/18   [provider]  VYVANSE 30 MG capsule Take 30 mg by mouth every morning. 10/04/18   [provider]    No Known Allergies  Family History  Problem Relation Age of Onset  . Diabetes Father   . Seizures Father     Social History Social History   Tobacco Use  . Smoking status: Current Every Day Smoker  . Smokeless tobacco: Never Used  Substance Use Topics  . Alcohol use: No  . Drug use: No    Review of Systems Constitutional: Negative for fever. Cardiovascular: Positive for chest  discomfort Respiratory: Negative for shortness of breath. Gastrointestinal: Negative for abdominal pain Musculoskeletal: Negative for musculoskeletal complaints Skin: Negative for skin complaints  Neurological: Negative for headache All other ROS negative  ____________________________________________   PHYSICAL EXAM:  VITAL SIGNS: ED Triage Vitals  Enc Vitals Group     BP 04/04/19 0438 (!) 140/95     Pulse Rate 04/04/19 0438 (!) 102     Resp 04/04/19 0438 (!) 23     Temp 04/04/19 0438 98.2 F (36.8 C)     Temp src --      SpO2 04/04/19 0438 97 %     Weight 04/04/19 0450 235 lb (106.6 kg)     Height 04/04/19 0450 5\' 9"  (1.753 m)     Head Circumference --      Peak Flow --      Pain Score 04/04/19 0436 10     Pain Loc --      Pain Edu? --      Excl. in GC? --     Constitutional: Alert and oriented. Well appearing and in no distress. Eyes: Normal exam ENT      Head: Normocephalic and atraumatic      Mouth/Throat: Mucous membranes are moist. Cardiovascular: Normal rate, regular rhythm. No murmur Respiratory: Normal respiratory effort without tachypnea nor retractions. Breath sounds are clear Gastrointestinal: Soft and nontender. No distention.  Musculoskeletal: Nontender with normal range of motion in all extremities. Neurologic:  Normal speech and language.  No gross focal neurologic deficits  Skin:  Skin is warm, dry and intact.  Psychiatric: Mood and affect are normal.   ____________________________________________    EKG  EKG viewed and interpreted by myself shows sinus tachycardia 102 bpm with a narrow QRS, normal axis, normal intervals, no concerning ST changes.  ____________________________________________   INITIAL IMPRESSION / ASSESSMENT AND PLAN / ED COURSE  Pertinent labs & imaging results that were available during my care of the patient were reviewed by me and considered in my medical decision making (see chart for details).   Patient presents to  the emergency department for left-sided chest discomfort and a "panic attack."  Patient states a history of anxiety in the past, ran out of his hydroxyzine.  Patient continues state mild chest discomfort as well as significant anxiety.  Has had similar presentations for his anxiety in the past per patient.  We will treat with Ativan in the emergency department.  As long as the patient feels better we will plan to discharge home, patient's physical exam and work-up are thus far nonrevealing including a reassuring EKG labs and negative troponin.  We will refill the patient's hydroxyzine and have him follow-up with his doctor.  Patient appears well, we will discharge with hydroxyzine.  Much improved after Ativan.  Casey [name] was evaluated in Emergency Department on 04/04/2019 for the symptoms described in the history of present illness. He was evaluated in the context of the global COVID-19 pandemic, which necessitated consideration that the patient might be at risk for infection with the SARS-CoV-2 virus that causes COVID-19. Institutional protocols and algorithms that pertain to the evaluation of patients at risk for COVID-19 are in a state of rapid change based on information released by regulatory bodies including the CDC and federal and state organizations. These policies and algorithms were followed during the patient's care in the ED.  ____________________________________________   FINAL CLINICAL IMPRESSION(S) / ED DIAGNOSES  Anxiety Chest pain   Harvest Dark, MD 04/04/19 1514

## 2019-05-22 ENCOUNTER — Other Ambulatory Visit: Payer: Self-pay

## 2019-05-22 ENCOUNTER — Emergency Department
Admission: EM | Admit: 2019-05-22 | Discharge: 2019-05-22 | Disposition: A | Discharge status: Other Acute Inpatient Hospital | Payer: Medicaid Other | Attending: Emergency Medicine | Admitting: Emergency Medicine

## 2019-05-22 ENCOUNTER — Encounter: Payer: Self-pay | Admitting: Emergency Medicine

## 2019-05-22 ENCOUNTER — Inpatient Hospital Stay
Admission: RE | Admit: 2019-05-22 | Discharge: 2019-05-27 | DRG: 882 | Disposition: A | Discharge status: Home / Self Care | Payer: Medicaid Other | Source: Intra-hospital | Attending: Psychiatry | Admitting: Psychiatry

## 2019-05-22 DIAGNOSIS — Z20828 Contact with and (suspected) exposure to other viral communicable diseases: Secondary | ICD-10-CM | POA: Insufficient documentation

## 2019-05-22 DIAGNOSIS — F1010 Alcohol abuse, uncomplicated: Secondary | ICD-10-CM

## 2019-05-22 DIAGNOSIS — Z87891 Personal history of nicotine dependence: Secondary | ICD-10-CM

## 2019-05-22 DIAGNOSIS — F322 Major depressive disorder, single episode, severe without psychotic features: Secondary | ICD-10-CM | POA: Insufficient documentation

## 2019-05-22 DIAGNOSIS — R45851 Suicidal ideations: Secondary | ICD-10-CM | POA: Diagnosis present

## 2019-05-22 DIAGNOSIS — F1020 Alcohol dependence, uncomplicated: Secondary | ICD-10-CM | POA: Diagnosis not present

## 2019-05-22 DIAGNOSIS — I10 Essential (primary) hypertension: Secondary | ICD-10-CM | POA: Diagnosis present

## 2019-05-22 DIAGNOSIS — Z79899 Other long term (current) drug therapy: Secondary | ICD-10-CM | POA: Diagnosis not present

## 2019-05-22 DIAGNOSIS — F332 Major depressive disorder, recurrent severe without psychotic features: Secondary | ICD-10-CM | POA: Diagnosis not present

## 2019-05-22 DIAGNOSIS — F4325 Adjustment disorder with mixed disturbance of emotions and conduct: Secondary | ICD-10-CM | POA: Diagnosis present

## 2019-05-22 DIAGNOSIS — F89 Unspecified disorder of psychological development: Secondary | ICD-10-CM

## 2019-05-22 DIAGNOSIS — F1994 Other psychoactive substance use, unspecified with psychoactive substance-induced mood disorder: Secondary | ICD-10-CM | POA: Diagnosis not present

## 2019-05-22 DIAGNOSIS — F329 Major depressive disorder, single episode, unspecified: Secondary | ICD-10-CM | POA: Diagnosis present

## 2019-05-22 LAB — URINE DRUG SCREEN, QUALITATIVE (ARMC ONLY)
Amphetamines, Ur Screen: NOT DETECTED
Barbiturates, Ur Screen: NOT DETECTED
Benzodiazepine, Ur Scrn: NOT DETECTED
Cannabinoid 50 Ng, Ur ~~LOC~~: NOT DETECTED
Cocaine Metabolite,Ur ~~LOC~~: NOT DETECTED
MDMA (Ecstasy)Ur Screen: NOT DETECTED
Methadone Scn, Ur: NOT DETECTED
Opiate, Ur Screen: NOT DETECTED
Phencyclidine (PCP) Ur S: NOT DETECTED
Tricyclic, Ur Screen: NOT DETECTED

## 2019-05-22 LAB — COMPREHENSIVE METABOLIC PANEL
ALT: 28 U/L (ref 0–44)
AST: 26 U/L (ref 15–41)
Albumin: 5.1 g/dL — ABNORMAL HIGH (ref 3.5–5.0)
Alkaline Phosphatase: 89 U/L (ref 38–126)
Anion gap: 13 (ref 5–15)
BUN: 9 mg/dL (ref 6–20)
CO2: 20 mmol/L — ABNORMAL LOW (ref 22–32)
Calcium: 9.5 mg/dL (ref 8.9–10.3)
Chloride: 105 mmol/L (ref 98–111)
Creatinine, Ser: 1.14 mg/dL (ref 0.61–1.24)
GFR calc Af Amer: >60 mL/min (ref >60)
GFR calc non Af Amer: >60 mL/min (ref >60)
Glucose, Bld: 101 mg/dL — ABNORMAL HIGH (ref 70–99)
Potassium: 3.4 mmol/L — ABNORMAL LOW (ref 3.5–5.1)
Sodium: 138 mmol/L (ref 135–145)
Total Bilirubin: 0.4 mg/dL (ref 0.3–1.2)
Total Protein: 8.3 g/dL — ABNORMAL HIGH (ref 6.5–8.1)

## 2019-05-22 LAB — CBC
HCT: 45.5 % (ref 39.0–52.0)
Hemoglobin: 15.6 g/dL (ref 13.0–17.0)
MCH: 30.2 pg (ref 26.0–34.0)
MCHC: 34.3 g/dL (ref 30.0–36.0)
MCV: 88.2 fL (ref 80.0–100.0)
Platelets: 368 10*3/uL (ref 150–400)
RBC: 5.16 MIL/uL (ref 4.22–5.81)
RDW: 12.2 % (ref 11.5–15.5)
WBC: 7.8 10*3/uL (ref 4.0–10.5)
nRBC: 0 % (ref 0.0–0.2)

## 2019-05-22 LAB — SALICYLATE LEVEL: Salicylate Lvl: <7 mg/dL (ref 2.8–30.0)

## 2019-05-22 LAB — ACETAMINOPHEN LEVEL: Acetaminophen (Tylenol), Serum: <10 ug/mL — ABNORMAL LOW (ref 10–30)

## 2019-05-22 LAB — SARS CORONAVIRUS 2 BY RT PCR (HOSPITAL ORDER, PERFORMED IN ~~LOC~~ HOSPITAL LAB): SARS Coronavirus 2: NEGATIVE

## 2019-05-22 LAB — ETHANOL: Alcohol, Ethyl (B): 156 mg/dL — ABNORMAL HIGH (ref <10)

## 2019-05-22 MED ORDER — MAGNESIUM HYDROXIDE 400 MG/5ML PO SUSP: 30.0000 mL | Freq: Every day | ORAL | Status: DC | PRN

## 2019-05-22 MED ORDER — ALUM & MAG HYDROXIDE-SIMETH 200-200-20 MG/5ML PO SUSP: 30.0000 mL | ORAL | Status: DC | PRN

## 2019-05-22 MED ORDER — TRAZODONE HCL 50 MG PO TABS
50.0000 mg | ORAL_TABLET | Freq: Every evening | ORAL | Status: DC | PRN
  Administered 2019-05-22 – 2019-05-26 (×3): 50 mg via ORAL
  Filled 2019-05-22 (×2): qty 1

## 2019-05-22 MED ORDER — HYDROXYZINE HCL 25 MG PO TABS
25.0000 mg | ORAL_TABLET | Freq: Three times a day (TID) | ORAL | Status: DC | PRN
  Administered 2019-05-23 – 2019-05-27 (×4): 25 mg via ORAL
  Filled 2019-05-22 (×4): qty 1

## 2019-05-22 MED ORDER — ACETAMINOPHEN 325 MG PO TABS
650.0000 mg | ORAL_TABLET | Freq: Four times a day (QID) | ORAL | Status: DC | PRN
  Administered 2019-05-23 – 2019-05-27 (×5): 650 mg via ORAL
  Filled 2019-05-22 (×5): qty 2

## 2019-05-22 NOTE — ED Notes (Signed)
Pt given breakfast tray at this time. 

## 2019-05-22 NOTE — ED Provider Notes (Deleted)
The patient has been evaluated at bedside by psychiatry.  Patient is clinically stable.  Not felt to be a danger to self or others.  No SI or Hi.  No indication for inpatient psychiatric admission at this time.  Appropriate for continued outpatient therapy.    Merlyn Lot, MD 05/22/19 [phone]

## 2019-05-22 NOTE — ED Notes (Signed)
Pt discharged to BMU. Pt has signed voluntary consent form. VS stable. Belongings will be sent with patient.

## 2019-05-22 NOTE — Tx Team (Signed)
Initial Treatment Plan 05/22/2019 5:44 PM [name] KDT:[id number]    PATIENT STRESSORS: Marital or family conflict Medication change or noncompliance Substance abuse   PATIENT STRENGTHS: Capable of independent living Child psychotherapist Supportive family/friends   PATIENT IDENTIFIED PROBLEMS: Depression  Alcohol abuse                   DISCHARGE CRITERIA:  Ability to meet basic life and health needs Adequate post-discharge living arrangements Medical problems require only outpatient monitoring Verbal commitment to aftercare and medication compliance  PRELIMINARY DISCHARGE PLAN: Attend aftercare/continuing care group Return to previous living arrangement  PATIENT/FAMILY INVOLVEMENT: This treatment plan has been presented to and reviewed with the patient, Casey [name], and/or family member,  The patient and family have been given the opportunity to ask questions and make suggestions.  Merlene Morse, RN 05/22/2019, 5:44 PM

## 2019-05-22 NOTE — ED Provider Notes (Signed)
[facility] Emergency Department Provider Note  ____________________________________________  Time seen: Approximately 5:29 AM  I have reviewed the triage vital signs and the nursing notes.   HISTORY  Chief Complaint Psychiatric Evaluation   HPI Casey [name] is a 28 y.o. male with a history of depression who presents voluntarily for suicidal thoughts.  Patient reports that he his wife left him 3 to 4 months ago and took with her is 28-year-old daughter.  He has not seen her in months.  Over the last 2 to 3 weeks his depression has become severe and constant.  He is now having thoughts of killing himself.  No plan, no prior suicide attempts.  Patient does report drinking alcohol every other day.  Denies any drug use.  Endorses compliance with his medications.    Past Medical History:  Diagnosis Date  . H/O eye surgery     Patient Active Problem List   Diagnosis Date Noted  . 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 HAND SURGERY      Prior to Admission medications   Medication Sig Start Date End Date Taking? Authorizing Provider  hydrOXYzine (ATARAX/VISTARIL) 25 MG tablet Take 1 tablet (25 mg total) by mouth 3 (three) times daily as needed for anxiety. 04/04/19   Harvest Dark, MD  hydrOXYzine (VISTARIL) 50 MG capsule Take 50 mg by mouth at bedtime as needed for sleep or anxiety. 10/04/18   [provider]  LATUDA 40 MG TABS tablet Take 40 mg by mouth at bedtime. 10/04/18   [provider]  VYVANSE 30 MG capsule Take 30 mg by mouth every morning. 10/04/18   [provider]    Allergies Patient has no known allergies.  Family History  Problem Relation Age of Onset  . Diabetes Father   . Seizures Father     Social History Social History   Tobacco Use  . Smoking status: Former Research scientist (life sciences)  . Smokeless tobacco: Never Used  Substance Use Topics  . Alcohol use: No  . Drug  use: No    Review of Systems  Constitutional: Negative for fever. Eyes: Negative for visual changes. ENT: Negative for sore throat. Neck: No neck pain  Cardiovascular: Negative for chest pain. Respiratory: Negative for shortness of breath. Gastrointestinal: Negative for abdominal pain, vomiting or diarrhea. Genitourinary: Negative for dysuria. Musculoskeletal: Negative for back pain. Skin: Negative for rash. Neurological: Negative for headaches, weakness or numbness. Psych: + depression and SI. No HI  ____________________________________________   PHYSICAL EXAM:  VITAL SIGNS: ED Triage Vitals  Enc Vitals Group     BP 05/22/19 0321 (!) 132/96     Pulse Rate 05/22/19 0321 (!) 127     Resp 05/22/19 0321 18     Temp 05/22/19 0321 98.8 F (37.1 C)     Temp Source 05/22/19 0321 Oral     SpO2 05/22/19 0321 97 %     Weight 05/22/19 0322 240 lb (108.9 kg)     Height 05/22/19 0322 5\' 9"  (1.753 m)     Head Circumference --      Peak Flow --      Pain Score 05/22/19 0322 0     Pain Loc --      Pain Edu? --      Excl. in [place]? --     Constitutional: Alert and oriented. Well appearing and in no apparent distress. HEENT:      Head: Normocephalic and atraumatic.  Eyes: Conjunctivae are normal. Sclera is non-icteric.       Mouth/Throat: Mucous membranes are moist.       Neck: Supple with no signs of meningismus. Cardiovascular: Regular rate and rhythm.  Respiratory: Normal respiratory effort.  Gastrointestinal: Soft, non tender, and non distended. Musculoskeletal: No edema, cyanosis, or erythema of extremities. Neurologic: Normal speech and language. Face is symmetric. Moving all extremities. No gross focal neurologic deficits are appreciated. Skin: Skin is warm, dry and intact. No rash noted. Psychiatric: Mood and affect are flat. Speech and behavior are normal.  ____________________________________________   LABS (all labs ordered are listed, but only abnormal  results are displayed)  Labs Reviewed  COMPREHENSIVE METABOLIC PANEL - Abnormal; Notable for the following components:      Result Value   Potassium 3.4 (*)    CO2 20 (*)    Glucose, Bld 101 (*)    Total Protein 8.3 (*)    Albumin 5.1 (*)    All other components within normal limits  ETHANOL - Abnormal; Notable for the following components:   Alcohol, Ethyl (B) 156 (*)    All other components within normal limits  ACETAMINOPHEN LEVEL - Abnormal; Notable for the following components:   Acetaminophen (Tylenol), Serum <10 (*)    All other components within normal limits  SALICYLATE LEVEL  CBC  URINE DRUG SCREEN, QUALITATIVE ([facility] ONLY)   ____________________________________________  EKG  none  ____________________________________________  RADIOLOGY  none  ____________________________________________   PROCEDURES  Procedure(s) performed: None Procedures Critical Care performed:  None ____________________________________________   INITIAL IMPRESSION / ASSESSMENT AND PLAN / ED COURSE  [age] y.o. male with a history of depression who presents voluntarily for suicidal thoughts in the setting of progressively worsening depression over several months.  Patient is pleasant and cooperative, polite, in no obvious distress.  Labs showing alcohol level of 156 but no other abnormalities. Will consult psych for evaluation.        As part of my medical decision making, I reviewed the following data within the electronic MEDICAL RECORD NUMBER Nursing notes reviewed and incorporated, Labs reviewed , Old chart reviewed, A consult was requested and obtained from this/these consultant(s) Psychiatry, Notes from prior ED visits and [place] Controlled Substance Database   Patient was evaluated in Emergency Department today for the symptoms described in the history of present illness. Patient was evaluated in the context of the global COVID-19 pandemic, which necessitated consideration that the patient  might be at risk for infection with the SARS-CoV-2 virus that causes COVID-19. Institutional protocols and algorithms that pertain to the evaluation of patients at risk for COVID-19 are in a state of rapid change based on information released by regulatory bodies including the CDC and federal and state organizations. These policies and algorithms were followed during the patient's care in the ED.   ____________________________________________   FINAL CLINICAL IMPRESSION(S) / ED DIAGNOSES   Final diagnoses:  Severe episode of recurrent major depressive disorder, without psychotic features (HCC)  Suicidal ideation      NEW MEDICATIONS STARTED DURING THIS VISIT:  ED Discharge Orders    None       Note:  This document was prepared using Dragon voice recognition software and may include unintentional dictation errors.    Don PerkingVeronese, [place], MD 05/22/19 [phone]

## 2019-05-22 NOTE — Progress Notes (Signed)
Patient pleasant and cooperative during admission assessment. Patient denies SI/HI at this time. Patient denies AVH.Patient is sad and tearful while he is talking about 3yrs old daughter whom he did not see for 4 months. Patient informed of fall risk status, fall risk assessed "low" at this time. Patient oriented to unit/staff/room. Patient denies any questions/concerns at this time. Patient safe on unit with Q15 minute checks for safety.Skin assessment and body search done,no contraband found.

## 2019-05-22 NOTE — ED Notes (Signed)
Pt ate 100% of breakfast.

## 2019-05-22 NOTE — BH Assessment (Addendum)
Assessment Note  Casey [name] is an 28 y.o. male.  The pt came in voluntarily due to suicidal thoughts.  He stated he has been having suicidal thoughts for the past 3-4 months after his wife and daughter left.  He stated he isn't sure where his daughter is located and he hasn't seen them in 3 months.  He denies having a plan.  He denies having any guns at home or having any previous suicide attempts.  He reports he is drinking about one 40 oz beer about once a day.  He stated he gets angry when he drinks and starts yelling.  He isn't currently seeing a counselor, but has a case Production designer, theatre/television/filmmanager with [employer].  He has not been inpatient in the past.  The pt is living with his father and father's girlfriend.  He denies self harm, HI, legal issues, history of abuse and hallucinations.  He is sleeping about 4 hours a day.  He has a good appetite. He reports feeling depressed, hopeless, guilty, has problems concentrating, crying spells, and is more irritable.  His blood alcohol level was 156.  Pt is dressed in scrubs. He is alert and oriented x4. Pt speaks in a clear tone, at moderate volume and normal pace. Eye contact is good. Pt's mood is depressed. Thought process is coherent and relevant. There is no indication Pt is currently responding to internal stimuli or experiencing delusional thought content.?Pt was cooperative throughout assessment.    Diagnosis: F33.2 Major depressive disorder, Recurrent episode, Severe F10.20 Alcohol use disorder, Severe  Past Medical History:  Past Medical History:  Diagnosis Date  . H/O eye surgery     Past Surgical History:  Procedure Laterality Date  . HAND SURGERY      Family History:  Family History  Problem Relation Age of Onset  . Diabetes Father   . Seizures Father     Social History:  reports that he has quit smoking. He has never used smokeless tobacco. He reports that he does not drink alcohol or use drugs.  Additional Social History:   Alcohol / Drug Use Pain Medications: See MAR Prescriptions: See MAR Over the Counter: See MAR History of alcohol / drug use?: Yes Longest period of sobriety (when/how long): unknown Substance #1 Name of Substance 1: alcohol 1 - Age of First Use: 19 1 - Amount (size/oz): one 40oz beer 1 - Frequency: every other day 1 - Last Use / Amount: 05/22/2019  CIWA: CIWA-Ar BP: (!) 132/96 Pulse Rate: (!) 127 COWS:    Allergies: No Known Allergies  Home Medications: (Not in a hospital admission)   OB/GYN Status:  No LMP for male patient.  General Assessment Data [facility] Assessment: In system Is this a Tele or Face-to-Face Assessment?: Face-to-Face Is this an Initial Assessment or a Re-assessment for this encounter?: Initial Assessment Patient Accompanied by:: N/A Language Other than English: No Living Arrangements: Other (Comment)(house) What gender do you identify as?: Male Marital status: Separated Living Arrangements: Parent, Non-relatives/Friends Can pt return to current living arrangement?: Yes Admission Status: Voluntary Is patient capable of signing voluntary admission?: Yes Referral Source: Self/Family/Friend Insurance type: Medicaid     Crisis Care Plan Living Arrangements: Parent, Non-relatives/Friends Legal Guardian: Other:(Self) Name of Psychiatrist: none Name of Therapist: none  Education Status Is patient currently in school?: No Is the patient employed, unemployed or receiving disability?: Receiving disability income  Risk to self with the past 6 months Suicidal Ideation: Yes-Currently Present Has patient  been a risk to self within the past 6 months prior to admission? : No Suicidal Intent: No Has patient had any suicidal intent within the past 6 months prior to admission? : No Is patient at risk for suicide?: Yes Suicidal Plan?: No Has patient had any suicidal plan within the past 6 months prior to admission? : No Access to  Means: No What has been your use of drugs/alcohol within the last 12 months?: alcohol use Previous Attempts/Gestures: No How many times?: 0 Other Self Harm Risks: none Triggers for Past Attempts: None known Intentional Self Injurious Behavior: None Family Suicide History: No Recent stressful life event(s): Divorce Persecutory voices/beliefs?: No Depression: Yes Depression Symptoms: Despondent, Insomnia, Tearfulness, Isolating, Loss of interest in usual pleasures, Feeling worthless/self pity, Feeling angry/irritable, Guilt Substance abuse history and/or treatment for substance abuse?: Yes Suicide prevention information given to non-admitted patients: Yes  Risk to Others within the past 6 months Homicidal Ideation: No Does patient have any lifetime risk of violence toward others beyond the six months prior to admission? : No Thoughts of Harm to Others: No Current Homicidal Intent: No Current Homicidal Plan: No Access to Homicidal Means: No Identified Victim: pt denies History of harm to others?: No Assessment of Violence: On admission Violent Behavior Description: pt yells at others Does patient have access to weapons?: No Criminal Charges Pending?: No Does patient have a court date: No Is patient on probation?: No  Psychosis Hallucinations: None noted Delusions: None noted  Mental Status Report Appearance/Hygiene: Unremarkable, In scrubs Eye Contact: Good Motor Activity: Freedom of movement, Unremarkable Speech: Logical/coherent Level of Consciousness: Alert Mood: Depressed Affect: Depressed Anxiety Level: None Thought Processes: Coherent, Relevant Judgement: Impaired Orientation: Person, Place, Time, Situation Obsessive Compulsive Thoughts/Behaviors: None  Cognitive Functioning Concentration: Normal Memory: Recent Intact, Remote Intact Is patient IDD: No Insight: Poor Impulse Control: Fair Appetite: Fair Have you had any weight changes? : No Change Sleep:  Decreased Total Hours of Sleep: 4 Vegetative Symptoms: None  ADLScreening [facility] Assessment Services) Patient's cognitive ability adequate to safely complete daily activities?: Yes Patient able to express need for assistance with ADLs?: Yes Independently performs ADLs?: Yes (appropriate for developmental age)  Prior Inpatient Therapy Prior Inpatient Therapy: No  Prior Outpatient Therapy Prior Outpatient Therapy: No Does patient have an ACCT team?: No Does patient have Intensive In-House Services?  : No Does patient have [facility] services? : No Does patient have P4CC services?: No  ADL Screening (condition at time of admission) Patient's cognitive ability adequate to safely complete daily activities?: Yes Patient able to express need for assistance with ADLs?: Yes Independently performs ADLs?: Yes (appropriate for developmental age)       Abuse/Neglect Assessment (Assessment to be complete while patient is alone) Abuse/Neglect Assessment Can Be Completed: Yes Physical Abuse: Denies Verbal Abuse: Denies Sexual Abuse: Denies Exploitation of patient/patient's resources: Denies Self-Neglect: Denies Values / Beliefs Cultural Requests During Hospitalization: None Spiritual Requests During Hospitalization: None Consults Spiritual Care Consult Needed: No Social Work Consult Needed: No Regulatory affairs officer (For Healthcare) Does Patient Have a Medical Advance Directive?: No          Disposition:  Disposition Initial Assessment Completed for this Encounter: Yes  On Site Evaluation by:   Reviewed with Physician:    Enzo Montgomery 05/22/2019 6:37 AM

## 2019-05-22 NOTE — ED Notes (Signed)
Hourly rounding reveals patient in room. No complaints, stable, in no acute distress. Q15 minute rounds and monitoring via Rover and Officer to continue.   

## 2019-05-22 NOTE — ED Notes (Signed)
Patient provided lunch tray.

## 2019-05-22 NOTE — BH Assessment (Addendum)
Patient is to be admitted to [facility] by Psych Nurse Practitioner, Marvia Pickles Attending Physician will be Dr. Weber Cooks.   Patient has been assigned to room 305, by Guadalupe   Intake Paper Work has been signed and placed on patient chart.  ER staff is aware of the admission:  Lattie Haw, ER Secretary    Dr. Quentin Cornwall, ER MD   Caryl Pina, Patient's Nurse   Gust Rung., Patient Access.

## 2019-05-22 NOTE — ED Notes (Signed)
Belongings placed in labeled bin in [employer].

## 2019-05-22 NOTE — ED Notes (Signed)
Continue to await breakfast tray to be delivered. 

## 2019-05-22 NOTE — ED Notes (Signed)
Pt in bed watching TV. Awaiting psych disposition. No needs at this time. Will continue to monitor pt.

## 2019-05-22 NOTE — ED Triage Notes (Signed)
Pt presents to ED with BPD c/o suicidal thoughts for the past 3 months. Pt states onset of symptoms was when his 30 year old daughter left. Denies having a plan. Tearful in triage. Hx psych history.

## 2019-05-22 NOTE — ED Notes (Signed)
Psychiatry to bedside; updated plan of care provided.

## 2019-05-22 NOTE — Consult Note (Signed)
[facility]   Reason for Consult:  Suicidal ideation Referring Physician:  EDP Patient Identification: Casey [name] MRN:  [id number] Principal Diagnosis: <principal problem not specified> Diagnosis:  Active Problems:   * No active hospital problems. *   Total Time spent with patient: 30 minutes  Subjective:   Casey [name] is a 28 y.o. male patient reports that he reports having suicidal thoughts since him and his girlfriend broke up and she left with their daughter 2-3 months ago. He states ht he is still having some vague suicidal thoughts today. He reports getting drunk yetserday and becamnse upset. He states that he does not feel like he doesn't need to be in the hospital and wants outpatient therapy and treatment. He reports that he has never seen a psychiarist or a therapist and denies any psychiatric medications, but then reports he is prescribed medications for anxiety. He denies any homicidal ideations or hallucinations. He gives permission to contact his father, Casey [name], at [phone].  Patient's father was contacted and he reports that the patient has threatened suicide multiple times over the last 2-3 months. He does confirm that this started after his girlfriend and daughter left. He reports that the patient has threatened to kill them and he cannot return to their home at this time. He states that the patient is unsafe and is going to hurt himself. He reports that the patient had a knife to his wrists about a month ago.  HPI:  Per Dr. Don PerkingVeronese: 28 y.o. male with a history of depression who presents voluntarily for suicidal thoughts.  Patient reports that he his wife left him 3 to 4 months ago and took with her is 30-year-old daughter.  He has not seen her in months.  Over the last 2 to 3 weeks his depression has become severe and constant.  He is now having thoughts of killing himself.  No plan, no prior suicide attempts.  Patient does report drinking alcohol  every other day.  Denies any drug use.  Endorses compliance with his medications.  Patient is seen by this provider face-to-face. Patient continues to endorse some vague suicidal ideations. Based on reports by family, patient is significantly minimizing his symptoms and is in need of inpatient treatment.   Past Psychiatric History: Patient reports no history. Father and stepmother report multiple suicide threats since his girlfriend and daughter left and threatening to kill others in th house. Reports medication for anxiety prescribed by PCP. Home meds listed Latuda and Vistaril.  Risk to Self: Suicidal Ideation: Yes-Currently Present Suicidal Intent: No Is patient at risk for suicide?: Yes Suicidal Plan?: No Access to Means: No What has been your use of drugs/alcohol within the last 12 months?: alcohol use How many times?: 0 Other Self Harm Risks: none Triggers for Past Attempts: None known Intentional Self Injurious Behavior: None Risk to Others: Homicidal Ideation: No Thoughts of Harm to Others: No Current Homicidal Intent: No Current Homicidal Plan: No Access to Homicidal Means: No Identified Victim: pt denies History of harm to others?: No Assessment of Violence: On admission Violent Behavior Description: pt yells at others Does patient have access to weapons?: No Criminal Charges Pending?: No Does patient have a court date: No Prior Inpatient Therapy: Prior Inpatient Therapy: No Prior Outpatient Therapy: Prior Outpatient Therapy: No Does patient have an ACCT team?: No Does patient have Intensive In-House Services?  : No Does patient have [facility] services? : No Does patient have P4CC services?: No  Past Medical History:  Past Medical History:  Diagnosis Date  . H/O eye surgery     Past Surgical History:  Procedure Laterality Date  . HAND SURGERY     Family History:  Family History  Problem Relation Age of Onset  . Diabetes Father   . Seizures Father    Family  Psychiatric  History: None reported Social History:  Social History   Substance and Sexual Activity  Alcohol Use No     Social History   Substance and Sexual Activity  Drug Use No    Social History   Socioeconomic History  . Marital status: Single    Spouse name: Not on file  . Number of children: Not on file  . Years of education: Not on file  . Highest education level: Not on file  Occupational History  . Not on file  Social Needs  . Financial resource strain: Not on file  . Food insecurity    Worry: Not on file    Inability: Not on file  . Transportation needs    Medical: Not on file    Non-medical: Not on file  Tobacco Use  . Smoking status: Former Games developermoker  . Smokeless tobacco: Never Used  Substance and Sexual Activity  . Alcohol use: No  . Drug use: No  . Sexual activity: Not on file  Lifestyle  . Physical activity    Days per week: Not on file    Minutes per session: Not on file  . Stress: Not on file  Relationships  . Social Musicianconnections    Talks on phone: Not on file    Gets together: Not on file    Attends religious service: Not on file    Active member of club or organization: Not on file    Attends meetings of clubs or organizations: Not on file    Relationship status: Not on file  Other Topics Concern  . Not on file  Social History Narrative  . Not on file   Additional Social History:    Allergies:  No Known Allergies  Labs:  Results for orders placed or performed during the hospital encounter of 05/22/19 (from the past 48 hour(s))  Comprehensive metabolic panel     Status: Abnormal   Collection Time: 05/22/19  3:28 AM  Result Value Ref Range   Sodium 138 135 - 145 mmol/L   Potassium 3.4 (L) 3.5 - 5.1 mmol/L   Chloride 105 98 - 111 mmol/L   CO2 20 (L) 22 - 32 mmol/L   Glucose, Bld 101 (H) 70 - 99 mg/dL   BUN 9 6 - 20 mg/dL   Creatinine, Ser 8.291.14 0.61 - 1.24 mg/dL   Calcium 9.5 8.9 - 56.210.3 mg/dL   Total Protein 8.3 (H) 6.5 - 8.1 g/dL    Albumin 5.1 (H) 3.5 - 5.0 g/dL   AST 26 15 - 41 U/L   ALT 28 0 - 44 U/L   Alkaline Phosphatase 89 38 - 126 U/L   Total Bilirubin 0.4 0.3 - 1.2 mg/dL   GFR calc non Af Amer >60 >60 mL/min   GFR calc Af Amer >60 >60 mL/min   Anion gap 13 5 - 15    Comment: Performed at [facility], [address] [place], [place] [address]  Ethanol     Status: Abnormal   Collection Time: 05/22/19  3:28 AM  Result Value Ref Range   Alcohol, Ethyl (B) 156 (H) <10 mg/dL    Comment: (NOTE) Lowest detectable  limit for serum alcohol is 10 mg/dL. For medical purposes only. Performed at [facility], [address] [place], [place] [address]   Salicylate level     Status: None   Collection Time: 05/22/19  3:28 AM  Result Value Ref Range   Salicylate Lvl <7.0 2.8 - 30.0 mg/dL    Comment: Performed at [facility], [address] [place], [place] [address]  Acetaminophen level     Status: Abnormal   Collection Time: 05/22/19  3:28 AM  Result Value Ref Range   Acetaminophen (Tylenol), Serum <10 (L) 10 - 30 ug/mL    Comment: (NOTE) Therapeutic concentrations vary significantly. A range of 10-30 ug/mL  may be an effective concentration for many patients. However, some  are best treated at concentrations outside of this range. Acetaminophen concentrations >150 ug/mL at 4 hours after ingestion  and >50 ug/mL at 12 hours after ingestion are often associated with  toxic reactions. Performed at [facility], [address] [place], [place] [address]   cbc     Status: None   Collection Time: 05/22/19  3:28 AM  Result Value Ref Range   WBC 7.8 4.0 - 10.5 K/uL   RBC 5.16 4.22 - 5.81 MIL/uL   Hemoglobin 15.6 13.0 - 17.0 g/dL   HCT 95.645.5 21.339.0 - 08.652.0 %   MCV 88.2 80.0 - 100.0 fL   MCH 30.2 26.0 - 34.0 pg   MCHC 34.3 30.0 - 36.0 g/dL   RDW 57.812.2 46.911.5 - 62.915.5 %   Platelets 368 150 - 400 K/uL   nRBC 0.0 0.0 - 0.2 %    Comment: Performed at [facility], [address] [place], [place] [address]  Urine Drug Screen, Qualitative     Status: None   Collection Time: 05/22/19  3:28 AM  Result Value Ref Range   Tricyclic, Ur Screen NONE DETECTED NONE DETECTED   Amphetamines, Ur Screen NONE DETECTED NONE DETECTED   MDMA (Ecstasy)Ur Screen NONE DETECTED NONE DETECTED   Cocaine Metabolite,Ur [place] NONE DETECTED NONE DETECTED   Opiate, Ur Screen NONE DETECTED NONE DETECTED   Phencyclidine (PCP) Ur S NONE DETECTED NONE DETECTED   Cannabinoid 50 Ng, Ur [place] NONE DETECTED NONE DETECTED   Barbiturates, Ur Screen NONE DETECTED NONE DETECTED   Benzodiazepine, Ur Scrn NONE DETECTED NONE DETECTED   Methadone Scn, Ur NONE DETECTED NONE DETECTED    Comment: (NOTE) Tricyclics + metabolites, urine    Cutoff 1000 ng/mL Amphetamines + metabolites, urine  Cutoff 1000 ng/mL MDMA (Ecstasy), urine              Cutoff 500 ng/mL Cocaine Metabolite, urine          Cutoff 300 ng/mL Opiate + metabolites, urine        Cutoff 300 ng/mL Phencyclidine (PCP), urine         Cutoff 25 ng/mL Cannabinoid, urine                 Cutoff 50 ng/mL Barbiturates + metabolites, urine  Cutoff 200 ng/mL Benzodiazepine, urine              Cutoff 200 ng/mL Methadone, urine                   Cutoff 300 ng/mL The urine drug screen provides only a preliminary, unconfirmed analytical test result and should not be used for non-medical purposes. Clinical consideration and professional judgment should be applied to any positive drug screen  result due to possible interfering substances. A more specific alternate chemical method must be used in order to obtain a confirmed analytical result. Gas chromatography / mass spectrometry (GC/MS) is the preferred confirmat ory method. Performed at [facility], [address] [place], [place] [address]     No current facility-administered medications for this encounter.    Current Outpatient Medications  Medication Sig Dispense Refill  .  hydrOXYzine (ATARAX/VISTARIL) 25 MG tablet Take 1 tablet (25 mg total) by mouth 3 (three) times daily as needed for anxiety. 20 tablet 0  . hydrOXYzine (VISTARIL) 50 MG capsule Take 50 mg by mouth at bedtime as needed for sleep or anxiety.    Marland Kitchen LATUDA 40 MG TABS tablet Take 40 mg by mouth at bedtime.    Marland Kitchen VYVANSE 30 MG capsule Take 30 mg by mouth every morning.      Musculoskeletal: Strength & Muscle Tone: within normal limits Gait & Station: normal Patient leans: N/A  Psychiatric Specialty Exam: Physical Exam  Nursing note and vitals reviewed. Constitutional: He is oriented to person, place, and time. He appears well-developed and well-nourished.  Cardiovascular: Normal rate.  Respiratory: Effort normal.  Musculoskeletal: Normal range of motion.  Neurological: He is alert and oriented to person, place, and time.    Review of Systems  Constitutional: Negative.   HENT: Negative.   Eyes: Negative.   Respiratory: Negative.   Cardiovascular: Negative.   Gastrointestinal: Negative.   Genitourinary: Negative.   Musculoskeletal: Negative.   Skin: Negative.   Neurological: Negative.   Endo/Heme/Allergies: Negative.   Psychiatric/Behavioral: Positive for depression, substance abuse and suicidal ideas.    Blood pressure (!) 131/96, pulse 97, temperature 98.2 F (36.8 C), temperature source Oral, resp. rate 18, height 5\' 9"  (1.753 m), weight 108.9 kg, SpO2 98 %.Body mass index is 35.44 kg/m.  General Appearance: Disheveled  Eye Contact:  Good  Speech:  Clear and Coherent and Normal Rate  Volume:  Decreased  Mood:  Depressed  Affect:  Depressed and Flat  Thought Process:  Coherent and Descriptions of Associations: Intact  Orientation:  Full (Time, Place, and Person)  Thought Content:  WDL  Suicidal Thoughts:  Yes.  without intent/plan  Homicidal Thoughts:  No but has been reported by family  Memory:  Immediate;   Fair Recent;   Fair Remote;   Fair  Judgement:  Fair  Insight:   Lacking  Psychomotor Activity:  Normal  Concentration:  Concentration: Good  Recall:  Good  Fund of Knowledge:  Good  Language:  Good  Akathisia:  No  Handed:  Right  AIMS (if indicated):     Assets:  Communication Skills Desire for Improvement Financial Resources/Insurance Physical Health Social Support Transportation  ADL's:  Intact  Cognition:  WNL  Sleep:        Treatment Plan Summary: Daily contact with patient to assess and evaluate symptoms and progress in treatment and Medication management  Disposition: Recommend psychiatric Inpatient admission when medically cleared.  [place], FNP 05/22/2019 11:10 AM

## 2019-05-22 NOTE — ED Notes (Signed)
Pt. Transferred from Triage to room 24 H after dressing out and screening for contraband. Report to include Situation, Background, Assessment and Recommendations from [employer]. Pt. Oriented to Quad including Q15 minute rounds as well as Engineer, drilling for their protection. Patient is alert and oriented, warm and dry in no acute distress. Patient reported  SI with a plan to use a knife. Contracted for safety. Denied HI, and AVH. Pt. Encouraged to let me know if needs arise.

## 2019-05-23 DIAGNOSIS — F89 Unspecified disorder of psychological development: Secondary | ICD-10-CM

## 2019-05-23 DIAGNOSIS — F4325 Adjustment disorder with mixed disturbance of emotions and conduct: Principal | ICD-10-CM

## 2019-05-23 DIAGNOSIS — F1010 Alcohol abuse, uncomplicated: Secondary | ICD-10-CM

## 2019-05-23 MED ORDER — AMLODIPINE BESYLATE 5 MG PO TABS
5.0000 mg | ORAL_TABLET | Freq: Every day | ORAL | Status: DC
  Administered 2019-05-23 – 2019-05-27 (×5): 5 mg via ORAL
  Filled 2019-05-23 (×5): qty 1

## 2019-05-23 MED ORDER — LURASIDONE HCL 40 MG PO TABS
40.0000 mg | ORAL_TABLET | Freq: Every day | ORAL | Status: DC
  Administered 2019-05-23 – 2019-05-27 (×5): 40 mg via ORAL
  Filled 2019-05-23 (×7): qty 1

## 2019-05-23 NOTE — [facility] Group Notes (Signed)
LCSW Group Therapy Note  05/23/2019 1:00 PM  Type of Therapy and Topic:  Group Therapy:  Feelings around Relapse and Recovery  Participation Level:  Did Not Attend   Description of Group:    Patients in this group will discuss emotions they experience before and after a relapse. They will process how experiencing these feelings, or avoidance of experiencing them, relates to having a relapse. Facilitator will guide patients to explore emotions they have related to recovery. Patients will be encouraged to process which emotions are more powerful. They will be guided to discuss the emotional reaction significant others in their lives may have to their relapse or recovery. Patients will be assisted in exploring ways to respond to the emotions of others without this contributing to a relapse.  Therapeutic Goals: 1. Patient will identify two or more emotions that lead to a relapse for them 2. Patient will identify two emotions that result when they relapse 3. Patient will identify two emotions related to recovery 4. Patient will demonstrate ability to communicate their needs through discussion and/or role plays   Summary of Patient Progress: X  Therapeutic Modalities:   Cognitive Behavioral Therapy Solution-Focused Therapy Assertiveness Training Relapse Prevention Therapy   Dinesh Ulysse, MSW, LCSW 05/23/2019 12:15 PM  

## 2019-05-23 NOTE — Progress Notes (Signed)
Recreation Therapy Notes   Date: 05/23/2019  Time: 9:30 am   Location: Craft room   Behavioral response: N/A   Intervention Topic: Decision-Making   Discussion/Intervention: Patient did not attend group.   Clinical Observations/Feedback:  Patient did not attend group.   Stephanne Greeley LRT/CTRS        Lillyan Hitson 05/23/2019 10:46 AM

## 2019-05-23 NOTE — B[facility] Counselor (Signed)
Adult Comprehensive Assessment  Patient ID: Casey [name], male   DOB: 1990-11-04, 28 y.o.   MRN: [id number]  Information Source: Information source: Patient  Current Stressors:  Patient states their primary concerns and needs for treatment are:: "I had thoughts of killing myself because I couldnt see my daughter and I broke up with her momma" Patient states their goals for this hospitilization and ongoing recovery are:: "My attitude and my anger, stop pushing people who are there for me away" Educational / Learning stressors: None Employment / Job issues: Writereceives SSI Family Relationships: Close relationship with father Surveyor, quantityinancial / Lack of resources (include bankruptcy): Limited income Housing / Lack of housing: Stable housing Physical health (include injuries & life threatening diseases): None Social relationships: None reported Substance abuse: Pt denies Bereavement / Loss: None reported  Living/Environment/Situation:  Living Arrangements: Parent Living conditions (as described by patient or guardian): "Its good" Who else lives in the home?: Father and father's girlfriend How long has patient lived in current situation?: 2 months What is atmosphere in current home: Supportive, Comfortable  Family History:  Marital status: Single Are you sexually active?: No What is your sexual orientation?: Heterosexual Has your sexual activity been affected by drugs, alcohol, medication, or emotional stress?: No Does patient have children?: Yes How many children?: 1 How is patient's relationship with their children?: Pt reports he has a daughter age 39  Childhood History:  By whom was/is the patient raised?: Father, Grandparents Additional childhood history information: Pt says he was also raised by paternal aunt Description of patient's relationship with caregiver when they were a child: "Good" Patient's description of current relationship with people who raised him/her: "Good" How were  you disciplined when you got in trouble as a child/adolescent?: "Grounded" Does patient have siblings?: Yes Number of Siblings: 1 Description of patient's current relationship with siblings: Pt says he has 1 sister and they have a good relationship Did patient suffer any verbal/emotional/physical/sexual abuse as a child?: No Did patient suffer from severe childhood neglect?: No Has patient ever been sexually abused/assaulted/raped as an adolescent or adult?: No Was the patient ever a victim of a crime or a disaster?: No Witnessed domestic violence?: No Has patient been effected by domestic violence as an adult?: No  Education:  Highest grade of school patient has completed: 12th Currently a student?: No Learning disability?: Yes What learning problems does patient have?: Pt says he had difficulty with reading  Employment/Work Situation:   Employment situation: On disability Why is patient on disability: Health reasons How long has patient been on disability: Pt says since age 89 Patient's job has been impacted by current illness: No What is the longest time patient has a held a job?: 1mth 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?: No Are There Guns or Other Weapons in Your Home?: No Are These ComptrollerWeapons Safely Secured?: (Pt denies access)  Financial Resources:   Financial resources: [employer] Pet[employer]ceives SSI, Support from parents / caregiver Does patient have a Lawyerrepresentative payee or guardian?: No  Alcohol/Substance Abuse:   What has been your use of drugs/alcohol within the last 12 months?: Pt denies If attempted suicide, did drugs/alcohol play a role in this?: No Alcohol/Substance Abuse Treatment Hx: Denies past history If yes, describe treatment: NA Has alcohol/substance abuse ever caused legal problems?: No  Social Support System:   Describe Community Support System: Father Type of faith/religion:  Chrisitan How does patient's faith help to  cope with current illness?: "Talk to God sometime"  Leisure/Recreation:   Leisure and Hobbies: Play video games  Strengths/Needs:   What is the patient's perception of their strengths?: Good at playing basketball Patient states they can use these personal strengths during their treatment to contribute to their recovery: "I can throw the ball around" Patient states these barriers may affect/interfere with their treatment: None reported Patient states these barriers may affect their return to the community: None reported Other important information patient would like considered in planning for their treatment: NA  Discharge Plan:   Currently receiving community mental health services: No Patient states concerns and preferences for aftercare planning are: Pt would like to be referred for outpatient treatment Patient states they will know when they are safe and ready for discharge when: "I feel better now" Does patient have access to transportation?: No Does patient have financial barriers related to discharge medications?: Yes Patient description of barriers related to discharge medications: No insurance Plan for no access to transportation at discharge: CSW will assist with transportation Will patient be returning to same living situation after discharge?: Yes  Summary/Recommendations:   Summary and Recommendations (to be completed by the evaluator): Pt is a 28 yr old male who says he was brought to the ED due to experiencing SI after a breakup with his girlfriend and not being able to see his daughter. Pt denies any drug or alcohol use. Pt denies any current SI/HI or AH/VH. Pt says he does not have a mental health provider but would like to be referred for outpatient treatment. While here, patient will benefit from crisis stabilization, medication evaluation, group therapy and psychoeducation. In addition, it is recommended that patient remain  compliant with the established discharge plan and continue treatment.  Thedora Rings T Brandan Robicheaux. 05/23/2019

## 2019-05-23 NOTE — Progress Notes (Signed)
Patient alert and oriented x 4, he denies SI/HI.AVH no distress noted , interacting appropriately with peers and staff, thoughts are organized and coherent, he appears less anxious, receptive to staff and complaint with medication regimen. 15 minutes safety checks maintained will continue to monitor.

## 2019-05-23 NOTE — Tx Team (Addendum)
Interdisciplinary Treatment and Diagnostic Plan Update  05/23/2019 Time of Session: 2:30PM Casey [name] MRN: [id number]  Principal Diagnosis: <principal problem not specified>  Secondary Diagnoses: Active Problems:   MDD (major depressive disorder), severe (HCC)   Current Medications:  Current Facility-Administered Medications  Medication Dose Route Frequency Provider Last Rate Last Dose  . acetaminophen (TYLENOL) tablet 650 mg  650 mg Oral Q6H PRN Money, Lowry Ram, FNP   650 mg at 05/23/19 0759  . alum & mag hydroxide-simeth (MAALOX/MYLANTA) 200-200-20 MG/5ML suspension 30 mL  30 mL Oral Q4H PRN Money, Lowry Ram, FNP      . hydrOXYzine (ATARAX/VISTARIL) tablet 25 mg  25 mg Oral TID PRN Money, Lowry Ram, FNP   25 mg at 05/23/19 1328  . magnesium hydroxide (MILK OF MAGNESIA) suspension 30 mL  30 mL Oral Daily PRN Money, Lowry Ram, FNP      . traZODone (DESYREL) tablet 50 mg  50 mg Oral QHS PRN Money, Lowry Ram, FNP   50 mg at 05/22/19 2225   PTA Medications: Medications Prior to Admission  Medication Sig Dispense Refill Last Dose  . hydrOXYzine (ATARAX/VISTARIL) 25 MG tablet Take 1 tablet (25 mg total) by mouth 3 (three) times daily as needed for anxiety. (Patient not taking: Reported on 05/22/2019) 20 tablet 0 Not Taking at prn  . hydrOXYzine (VISTARIL) 50 MG capsule Take 50 mg by mouth at bedtime as needed for sleep or anxiety.   Not Taking at Unknown time  . LATUDA 40 MG TABS tablet Take 40 mg by mouth at bedtime.   Not Taking at Unknown time  . VYVANSE 30 MG capsule Take 30 mg by mouth every morning.   Not Taking at Unknown time    Patient Stressors: Marital or family conflict Medication change or noncompliance Substance abuse  Patient Strengths: Capable of independent living Child psychotherapist Supportive family/friends  Treatment Modalities: Medication Management, Group therapy, Case management,  1 to 1 session with clinician, Psychoeducation, Recreational  therapy.   Physician Treatment Plan for Primary Diagnosis: <principal problem not specified> Long Term Goal(s):     Short Term Goals:    Medication Management: Evaluate patient's response, side effects, and tolerance of medication regimen.  Therapeutic Interventions: 1 to 1 sessions, Unit Group sessions and Medication administration.  Evaluation of Outcomes: Progressing  Physician Treatment Plan for Secondary Diagnosis: Active Problems:   MDD (major depressive disorder), severe ([place])  Long Term Goal(s):     Short Term Goals:       Medication Management: Evaluate patient's response, side effects, and tolerance of medication regimen.  Therapeutic Interventions: 1 to 1 sessions, Unit Group sessions and Medication administration.  Evaluation of Outcomes: Progressing   RN Treatment Plan for Primary Diagnosis: <principal problem not specified> Long Term Goal(s): Knowledge of disease and therapeutic regimen to maintain health will improve  Short Term Goals: Ability to verbalize frustration and anger appropriately will improve, Ability to demonstrate self-control, Ability to participate in decision making will improve, Ability to verbalize feelings will improve and Ability to disclose and discuss suicidal ideas  Medication Management: RN will administer medications as ordered by provider, will assess and evaluate patient's response and provide education to patient for prescribed medication. RN will report any adverse and/or side effects to prescribing provider.  Therapeutic Interventions: 1 on 1 counseling sessions, Psychoeducation, Medication administration, Evaluate responses to treatment, Monitor vital signs and CBGs as ordered, Perform/monitor CIWA, COWS, AIMS and Fall Risk screenings as ordered, Perform wound care treatments as  ordered.  Evaluation of Outcomes: Progressing   LCSW Treatment Plan for Primary Diagnosis: <principal problem not specified> Long Term Goal(s): Safe  transition to appropriate next level of care at discharge, Engage patient in therapeutic group addressing interpersonal concerns.  Short Term Goals: Engage patient in aftercare planning with referrals and resources, Increase social support, Increase ability to appropriately verbalize feelings, Increase emotional regulation and Facilitate acceptance of mental health diagnosis and concerns  Therapeutic Interventions: Assess for all discharge needs, 1 to 1 time with Social worker, Explore available resources and support systems, Assess for adequacy in community support network, Educate family and significant other(s) on suicide prevention, Complete Psychosocial Assessment, Interpersonal group therapy.  Evaluation of Outcomes: Progressing   Progress in Treatment: Attending groups: No. Participating in groups: No. Taking medication as prescribed: Yes. Toleration medication: Yes. Family/Significant other contact made: Yes, individual(s) contacted:  SPE completed with the patient's father.  Patient understands diagnosis: Yes. Discussing patient identified problems/goals with staff: Yes. Medical problems stabilized or resolved: Yes. Denies suicidal/homicidal ideation: Yes. Issues/concerns per patient self-inventory: No. Other: none  New problem(s) identified: No, Describe:  none  New Short Term/Long Term Goal(s): detox, elimination of symptoms of psychosis, medication management for mood stabilization; elimination of SI thoughts; development of comprehensive mental wellness/sobriety plan.  Patient Goals:    Discharge Plan or Barriers: Patient is unable to return to his fathers house.  Patient plans to follow up with mother and other family members regarding going to their home. Patient report that he is deciding between [facility] [facility].   Reason for Continuation of Hospitalization: Aggression Anxiety Depression Medical Issues Medication stabilization Suicidal  ideation  Estimated Length of Stay: TBD  Recreational Therapy: Patient: N/A Patient Goal: Patient will engage in groups without prompting or encouragement from LRT x3 group sessions within 5 recreation therapy group sessions  Attendees:  Patient: Casey [name] 05/23/2019 3:12 PM  Physician: Dr. Toni Amendlapacs, MD 05/23/2019 3:12 PM  Nursing: Cecille AmsterdamGigi Manirattui, RN 05/23/2019 3:12 PM  RN Care Manager: 05/23/2019 3:12 PM  Social Worker: Penni HomansMichaela Stanfield, LCSW 05/23/2019 3:12 PM  Recreational Therapist: Garret ReddishShay Khalen Styer, CTRS, LRT 05/23/2019 3:12 PM  Other: Lowella Dandyarren Livingston, LCSW 05/23/2019 3:12 PM  Other:  05/23/2019 3:12 PM  Other: 05/23/2019 3:12 PM      Scribe for Treatment Team: Harden MoMichaela J Stanfield, LCSW 05/23/2019 3:23 PM

## 2019-05-23 NOTE — Progress Notes (Signed)
Recreation Therapy Notes  INPATIENT RECREATION THERAPY ASSESSMENT  Patient Details Name: Casey [name] MRN: [id number] DOB: Jan 02, 1991 Today's Date: 05/23/2019       Information Obtained From: Patient  Able to Participate in Assessment/Interview: Yes  Patient Presentation: Responsive  Reason for Admission (Per Patient): Active Symptoms  Patient Stressors: Relationship  Coping Skills:   Education officer, community, Sports  Leisure Interests (2+):  Sports - Basketball, Games - Video games  Frequency of Recreation/Participation: Arboriculturist Resources:     [employer]:     Current Use:    If no, Barriers?:    Expressed Interest in [place] of Residence:    Patient Main Form of Transportation: Walk  Patient Strengths:  N/A  Patient Identified Areas of Improvement:  N/A  Patient Goal for Hospitalization:  To fix my anger  Current SI (including self-harm):  No  Current HI:  No  Current AVH: No  Staff Intervention Plan: Group Attendance, Collaborate with Interdisciplinary Treatment Team  Consent to Intern Participation: N/A  Aracelli Woloszyn 05/23/2019, 3:58 PM

## 2019-05-23 NOTE — [facility] Suicide Risk Assessment (Signed)
[place] INPATIENT:  Family/Significant Other Suicide Prevention Education  Suicide Prevention Education:  Education Completed; Remus Hagedorn [phone] has been identified by the patient as the family member/significant other with whom the patient will be residing, and identified as the person(s) who will aid the patient in the event of a mental health crisis (suicidal ideations/suicide attempt).  With written consent from the patient, the family member/significant other has been provided the following suicide prevention education, prior to the and/or following the discharge of the patient.  The suicide prevention education provided includes the following:  Suicide risk factors  Suicide prevention and interventions  National Suicide Hotline telephone number  [facility] assessment telephone number  [facility] Emergency Assistance [place] and/or Residential Mobile Crisis Unit telephone number  Request made of family/significant other to:  Remove weapons (e.g., guns, rifles, knives), all items previously/currently identified as safety concern.    Remove drugs/medications (over-the-counter, prescriptions, illicit drugs), all items previously/currently identified as a safety concern.  The family member/significant other verbalizes understanding of the suicide prevention education information provided.  The family member/significant other agrees to remove the items of safety concern listed above. Mr. Ruddock reports the pt was brought to the hospital due to experiencing SI prompted by a recent break up with his girlfriend. He says the pt has been living with him but can not return to his home. He says there are other family members who may be willing to house him. He states pt peer support is named Elberta Fortis but is unable to recall name of his mental health provider. Mr Kerney denies the pt having access to guns or weapons.  Kalani Baray T Deckard Stuber 05/23/2019, 11:30 AM

## 2019-05-23 NOTE — H&P (Signed)
Psychiatric Admission Assessment Adult  Patient Identification: Casey [name] MRN:  [id number] Date of Evaluation:  05/23/2019 Chief Complaint:  depression Principal Diagnosis: Adjustment disorder with mixed disturbance of emotions and conduct Diagnosis:  Principal Problem:   Adjustment disorder with mixed disturbance of emotions and conduct Active Problems:   Alcohol abuse   Developmental disability  History of Present Illness: Patient seen chart reviewed.  Patient came to the emergency room reporting he is having suicidal ideation recently.  He was slightly intoxicated on presentation.  He had not done anything to harm himself.  Today he reports that he gets sad when he is drinking alcohol.  Primary stress is missing his daughter.  Evidently he has a child by a woman with whom he is no longer involved and he has not seen her in many months.  He gets sad thinking about it.  Otherwise he says most of the time his mood is okay.  Says he sleeps okay denies appetite changes denies suicidal thoughts or homicidal thoughts denies psychotic symptoms.  He is on medication prescribed through [facility] and also talks to a counselor every week.  Patient said that he wanted to try and get his anger problems under control.  Also indicates that he only really gets angry and irritable when he is drinking. Associated Signs/Symptoms: Depression Symptoms:  depressed mood, suicidal thoughts without plan, (Hypo) Manic Symptoms:  None Anxiety Symptoms:  Excessive Worry, Psychotic Symptoms:  None PTSD Symptoms: Negative Total Time spent with patient: 1 hour  Past Psychiatric History: Does not appear to have had previous inpatient treatment.  Denies past suicide attempts.  Denies homicide attempts.  Currently goes to [facility] and is being treated with Latuda 40 mg in the evening.  Also Vyvanse 30 mg in the morning.  Patient says he finds it very helpful talking to his counselor regularly.  He is on disability because  of a developmental disability the specifics of which are unclear.  He describes himself as being "a slow learner"  Is the patient at risk to self? Yes.    Has the patient been a risk to self in the past 6 months? Yes.    Has the patient been a risk to self within the distant past? No.  Is the patient a risk to others? No.  Has the patient been a risk to others in the past 6 months? No.  Has the patient been a risk to others within the distant past? No.   Prior Inpatient Therapy:   Prior Outpatient Therapy:    Alcohol Screening: 1. How often do you have a drink containing alcohol?: 4 or more times a week 2. How many drinks containing alcohol do you have on a typical day when you are drinking?: 3 or 4 3. How often do you have six or more drinks on one occasion?: Less than monthly AUDIT-C Score: 6 4. How often during the last year have you found that you were not able to stop drinking once you had started?: Never 5. How often during the last year have you failed to do what was normally expected from you becasue of drinking?: Never 6. How often during the last year have you needed a first drink in the morning to get yourself going after a heavy drinking session?: Never 7. How often during the last year have you had a feeling of guilt of remorse after drinking?: Never 8. How often during the last year have you been unable to remember what happened the night  before because you had been drinking?: Never 9. Have you or someone else been injured as a result of your drinking?: No 10. Has a relative or friend or a doctor or another health worker been concerned about your drinking or suggested you cut down?: No Alcohol Use Disorder Identification Test Final Score (AUDIT): 6 Alcohol Brief Interventions/Follow-up: AUDIT Score <7 follow-up not indicated Substance Abuse History in the last 12 months:  Yes.   Consequences of Substance Abuse: Medical Consequences:  Worsening mood symptoms Previous  Psychotropic Medications: Yes  Psychological Evaluations: Yes  Past Medical History:  Past Medical History:  Diagnosis Date  . H/O eye surgery     Past Surgical History:  Procedure Laterality Date  . HAND SURGERY     Family History:  Family History  Problem Relation Age of Onset  . Diabetes Father   . Seizures Father    Family Psychiatric  History: Father with seizure disorder Tobacco Screening: Have you used any form of tobacco in the last 30 days? (Cigarettes, Smokeless Tobacco, Cigars, and/or Pipes): No Social History:  Social History   Substance and Sexual Activity  Alcohol Use No     Social History   Substance and Sexual Activity  Drug Use No    Additional Social History: Marital status: Single Are you sexually active?: No What is your sexual orientation?: Heterosexual Has your sexual activity been affected by drugs, alcohol, medication, or emotional stress?: No Does patient have children?: Yes How many children?: 1 How is patient's relationship with their children?: Pt reports he has a daughter age 30                         Allergies:  No Known Allergies Lab Results:  Results for orders placed or performed during the hospital encounter of 05/22/19 (from the past 48 hour(s))  Comprehensive metabolic panel     Status: Abnormal   Collection Time: 05/22/19  3:28 AM  Result Value Ref Range   Sodium 138 135 - 145 mmol/L   Potassium 3.4 (L) 3.5 - 5.1 mmol/L   Chloride 105 98 - 111 mmol/L   CO2 20 (L) 22 - 32 mmol/L   Glucose, Bld 101 (H) 70 - 99 mg/dL   BUN 9 6 - 20 mg/dL   Creatinine, Ser 1.61 0.61 - 1.24 mg/dL   Calcium 9.5 8.9 - 09.6 mg/dL   Total Protein 8.3 (H) 6.5 - 8.1 g/dL   Albumin 5.1 (H) 3.5 - 5.0 g/dL   AST 26 15 - 41 U/L   ALT 28 0 - 44 U/L   Alkaline Phosphatase 89 38 - 126 U/L   Total Bilirubin 0.4 0.3 - 1.2 mg/dL   GFR calc non Af Amer >60 >60 mL/min   GFR calc Af Amer >60 >60 mL/min   Anion gap 13 5 - 15    Comment: Performed at  [facility], [address] [place], [place] [address]  Ethanol     Status: Abnormal   Collection Time: 05/22/19  3:28 AM  Result Value Ref Range   Alcohol, Ethyl (B) 156 (H) <10 mg/dL    Comment: (NOTE) Lowest detectable limit for serum alcohol is 10 mg/dL. For medical purposes only. Performed at [facility], [address] [place], [place] [address]   Salicylate level     Status: None   Collection Time: 05/22/19  3:28 AM  Result Value Ref Range   Salicylate Lvl <7.0 2.8 - 30.0  mg/dL    Comment: Performed at [facility], [address] [place], [place] [address]  Acetaminophen level     Status: Abnormal   Collection Time: 05/22/19  3:28 AM  Result Value Ref Range   Acetaminophen (Tylenol), Serum <10 (L) 10 - 30 ug/mL    Comment: (NOTE) Therapeutic concentrations vary significantly. A range of 10-30 ug/mL  may be an effective concentration for many patients. However, some  are best treated at concentrations outside of this range. Acetaminophen concentrations >150 ug/mL at 4 hours after ingestion  and >50 ug/mL at 12 hours after ingestion are often associated with  toxic reactions. Performed at [facility], [address] [place], [place] [address]   cbc     Status: None   Collection Time: 05/22/19  3:28 AM  Result Value Ref Range   WBC 7.8 4.0 - 10.5 K/uL   RBC 5.16 4.22 - 5.81 MIL/uL   Hemoglobin 15.6 13.0 - 17.0 g/dL   HCT 09.845.5 11.939.0 - 14.752.0 %   MCV 88.2 80.0 - 100.0 fL   MCH 30.2 26.0 - 34.0 pg   MCHC 34.3 30.0 - 36.0 g/dL   RDW 82.912.2 56.211.5 - 13.015.5 %   Platelets 368 150 - 400 K/uL   nRBC 0.0 0.0 - 0.2 %    Comment: Performed at [facility], [address] [place], [place] [address]  Urine Drug Screen, Qualitative     Status: None   Collection Time: 05/22/19  3:28 AM  Result Value Ref Range   Tricyclic, Ur Screen NONE DETECTED NONE DETECTED   Amphetamines, Ur Screen NONE DETECTED NONE DETECTED   MDMA (Ecstasy)Ur  Screen NONE DETECTED NONE DETECTED   Cocaine Metabolite,Ur [place] NONE DETECTED NONE DETECTED   Opiate, Ur Screen NONE DETECTED NONE DETECTED   Phencyclidine (PCP) Ur S NONE DETECTED NONE DETECTED   Cannabinoid 50 Ng, Ur [place] NONE DETECTED NONE DETECTED   Barbiturates, Ur Screen NONE DETECTED NONE DETECTED   Benzodiazepine, Ur Scrn NONE DETECTED NONE DETECTED   Methadone Scn, Ur NONE DETECTED NONE DETECTED    Comment: (NOTE) Tricyclics + metabolites, urine    Cutoff 1000 ng/mL Amphetamines + metabolites, urine  Cutoff 1000 ng/mL MDMA (Ecstasy), urine              Cutoff 500 ng/mL Cocaine Metabolite, urine          Cutoff 300 ng/mL Opiate + metabolites, urine        Cutoff 300 ng/mL Phencyclidine (PCP), urine         Cutoff 25 ng/mL Cannabinoid, urine                 Cutoff 50 ng/mL Barbiturates + metabolites, urine  Cutoff 200 ng/mL Benzodiazepine, urine              Cutoff 200 ng/mL Methadone, urine                   Cutoff 300 ng/mL The urine drug screen provides only a preliminary, unconfirmed analytical test result and should not be used for non-medical purposes. Clinical consideration and professional judgment should be applied to any positive drug screen result due to possible interfering substances. A more specific alternate chemical method must be used in order to obtain a confirmed analytical result. Gas chromatography / mass spectrometry (GC/MS) is the preferred confirmat ory method. Performed at [facility], [address] [place], [place] [address]   SARS Coronavirus 2 [facility](Hospital order,  Performed in [facility] hospital lab) Nasopharyngeal Nasopharyngeal Swab     Status: None   Collection Time: 05/22/19  2:20 PM   Specimen: Nasopharyngeal Swab  Result Value Ref Range   SARS Coronavirus 2 NEGATIVE NEGATIVE    Comment: (NOTE) If result is NEGATIVE SARS-CoV-2 target nucleic acids are NOT DETECTED. The SARS-CoV-2 RNA is generally detectable in upper and lower   respiratory specimens during the acute phase of infection. The lowest  concentration of SARS-CoV-2 viral copies this assay can detect is 250  copies / mL. A negative result does not preclude SARS-CoV-2 infection  and should not be used as the sole basis for treatment or other  patient management decisions.  A negative result may occur with  improper specimen collection / handling, submission of specimen other  than nasopharyngeal swab, presence of viral mutation(s) within the  areas targeted by this assay, and inadequate number of viral copies  (<250 copies / mL). A negative result must be combined with clinical  observations, patient history, and epidemiological information. If result is POSITIVE SARS-CoV-2 target nucleic acids are DETECTED. The SARS-CoV-2 RNA is generally detectable in upper and lower  respiratory specimens dur ing the acute phase of infection.  Positive  results are indicative of active infection with SARS-CoV-2.  Clinical  correlation with patient history and other diagnostic information is  necessary to determine patient infection status.  Positive results do  not rule out bacterial infection or co-infection with other viruses. If result is PRESUMPTIVE POSTIVE SARS-CoV-2 nucleic acids MAY BE PRESENT.   A presumptive positive result was obtained on the submitted specimen  and confirmed on repeat testing.  While 2019 novel coronavirus  (SARS-CoV-2) nucleic acids may be present in the submitted sample  additional confirmatory testing may be necessary for epidemiological  and / or clinical management purposes  to differentiate between  SARS-CoV-2 and other Sarbecovirus currently known to infect humans.  If clinically indicated additional testing with an alternate test  methodology [phone]([id number]) is advised. The SARS-CoV-2 RNA is generally  detectable in upper and lower respiratory sp ecimens during the acute  phase of infection. The expected result is Negative. Fact  Sheet for Patients:  BoilerBrush.com.cyhttps://www.fda.gov/media/136312/download Fact Sheet for Healthcare Providers: https://pope.com/https://www.fda.gov/media/136313/download This test is not yet approved or cleared by the [place] FDA and has been authorized for detection and/or diagnosis of SARS-CoV-2 by FDA under an Emergency Use Authorization (EUA).  This EUA will remain in effect (meaning this test can be used) for the duration of the COVID-19 declaration under Section 564(b)(1) of the Act, 21 U.S.C. section 360bbb-3(b)(1), unless the authorization is terminated or revoked sooner. Performed at [facility], [address] [place], [place] [address]     Blood Alcohol level:  Lab Results  Component Value Date   ETH 156 (H) 05/22/2019    Metabolic Disorder Labs:  Lab Results  Component Value Date   HGBA1C 5.8 (H) 02/09/2018   MPG 119.76 02/09/2018   No results found for: PROLACTIN No results found for: CHOL, TRIG, HDL, CHOLHDL, VLDL, LDLCALC  Current Medications: Current Facility-Administered Medications  Medication Dose Route Frequency Provider Last Rate Last Dose  . acetaminophen (TYLENOL) tablet 650 mg  650 mg Oral Q6H PRN Money, Gerlene Burdockravis B, FNP   650 mg at 05/23/19 0759  . alum & mag hydroxide-simeth (MAALOX/MYLANTA) 200-200-20 MG/5ML suspension 30 mL  30 mL Oral Q4H PRN Money, Gerlene Burdockravis B, FNP      . hydrOXYzine (ATARAX/VISTARIL) tablet 25 mg  25  mg Oral TID PRN Money, Lowry Ram, FNP   25 mg at 05/23/19 1328  . magnesium hydroxide (MILK OF MAGNESIA) suspension 30 mL  30 mL Oral Daily PRN Money, Lowry Ram, FNP      . traZODone (DESYREL) tablet 50 mg  50 mg Oral QHS PRN Money, Lowry Ram, FNP   50 mg at 05/22/19 2225   PTA Medications: Medications Prior to Admission  Medication Sig Dispense Refill Last Dose  . hydrOXYzine (ATARAX/VISTARIL) 25 MG tablet Take 1 tablet (25 mg total) by mouth 3 (three) times daily as needed for anxiety. (Patient not taking: Reported on 05/22/2019) 20 tablet 0 Not  Taking at prn  . hydrOXYzine (VISTARIL) 50 MG capsule Take 50 mg by mouth at bedtime as needed for sleep or anxiety.   Not Taking at Unknown time  . LATUDA 40 MG TABS tablet Take 40 mg by mouth at bedtime.   Not Taking at Unknown time  . VYVANSE 30 MG capsule Take 30 mg by mouth every morning.   Not Taking at Unknown time    Musculoskeletal: Strength & Muscle Tone: within normal limits Gait & Station: normal Patient leans: N/A  Psychiatric Specialty Exam: Physical Exam  Nursing note and vitals reviewed. Constitutional: He appears well-developed and well-nourished.  HENT:  Head: Normocephalic and atraumatic.  Eyes: Pupils are equal, round, and reactive to light. Conjunctivae are normal.  Neck: Normal range of motion.  Cardiovascular: Regular rhythm and normal heart sounds.  Respiratory: Effort normal. No respiratory distress.  GI: Soft.  Musculoskeletal: Normal range of motion.  Neurological: He is alert.  Skin: Skin is warm and dry.  Psychiatric: His affect is blunt. His speech is delayed. He is slowed. Thought content is not paranoid. He expresses impulsivity. He expresses no homicidal and no suicidal ideation. He exhibits abnormal recent memory.    Review of Systems  Constitutional: Negative.   HENT: Negative.   Eyes: Negative.   Respiratory: Negative.   Cardiovascular: Negative.   Gastrointestinal: Negative.   Musculoskeletal: Negative.   Skin: Negative.   Neurological: Negative.   Psychiatric/Behavioral: Positive for substance abuse. Negative for depression, hallucinations, memory loss and suicidal ideas. The patient is not nervous/anxious and does not have insomnia.     Blood pressure (!) 152/84, pulse (!) 102, temperature 97.6 F (36.4 C), temperature source Oral, resp. rate 18, height 5\' 9"  (1.753 m), weight 101.6 kg, SpO2 100 %.Body mass index is 33.08 kg/m.  General Appearance: Casual  Eye Contact:  Fair  Speech:  Clear and Coherent  Volume:  Normal  Mood:   Euthymic  Affect:  Constricted  Thought Process:  Goal Directed  Orientation:  Full (Time, Place, and Person)  Thought Content:  Logical  Suicidal Thoughts:  No  Homicidal Thoughts:  No  Memory:  Immediate;   Fair Recent;   Fair Remote;   Fair  Judgement:  Impaired  Insight:  Shallow  Psychomotor Activity:  Decreased  Concentration:  Concentration: Poor  Recall:  [employer] of Knowledge:  Fair  Language:  Fair  Akathisia:  No  Handed:  Right  AIMS (if indicated):     Assets:  Desire for Improvement  ADL's:  Intact  Cognition:  WNL  Sleep:  Number of Hours: 6.25    Treatment Plan Summary: Plan Restart the Latuda in the evening.  I also noticed that his blood pressure is a little up and I am adding a low-dose of amlodipine.  We do not have Vyvanse on  hand and rather than trying to replace it with short acting Adderall I will leave that open.  Patient does not report any history of withdrawal symptoms from alcohol but we can watch for that.  Engage in individual and group therapy.  Likely be able to look into discharge after the weekend.  Patient will have to make arrangements with other family as his father says that he no longer feels comfortable keeping the patient at home.  Patient and father both say they are confident that other family members will be willing to work with him.  Observation Level/Precautions:  15 minute checks  Laboratory:  Chemistry Profile  Psychotherapy:    Medications:    Consultations:    Discharge Concerns:    Estimated LOS:  Other:     Physician Treatment Plan for Primary Diagnosis: Adjustment disorder with mixed disturbance of emotions and conduct Long Term Goal(s): Improvement in symptoms so as ready for discharge  Short Term Goals: Ability to verbalize feelings will improve and Ability to disclose and discuss suicidal ideas  Physician Treatment Plan for Secondary Diagnosis: Principal Problem:   Adjustment disorder with mixed disturbance of  emotions and conduct Active Problems:   Alcohol abuse   Developmental disability  Long Term Goal(s): Improvement in symptoms so as ready for discharge  Short Term Goals: Ability to identify triggers associated with substance abuse/mental health issues will improve  I certify that inpatient services furnished can reasonably be expected to improve the patient's condition.    Mordecai RasmussenJohn Devontay Celaya, MD 8/14/20205:49 PM

## 2019-05-23 NOTE — Progress Notes (Signed)
   05/23/19 1800  Clinical Encounter Type  Visited With Patient  Visit Type Initial;Spiritual support  Referral From Patient  Spiritual Encounters  Spiritual Needs Prayer;Emotional  Stress Factors  Patient Stress Factors Family relationships  Ch received a call for emotional/spiritual support and a request for a Bible. Pt shared about his love for his 30-year-old daughter Bubba Hales and how he gets encouragement from thinking about her. Pt is separated from his daughter as his ex-wife remarried and wouldn't let him get in touch with her. Pt misses Bubba Hales very much and hopes to meet her again one day. Pt requested a prayer and ch prayed for strength, wisdom, and a reunion with his daughter. Pt appreciated the visit saying "I need someone to talk to. I can't just sit and cry in my room." Ch will follow up on Monday to continue the support.

## 2019-05-23 NOTE — [facility] Suicide Risk Assessment (Signed)
[facility] Admission Suicide Risk Assessment   Nursing information obtained from:  Patient Demographic factors:  Male Current Mental Status:  NA Loss Factors:  Loss of significant relationship Historical Factors:  NA Risk Reduction Factors:  Responsible for children under 30 years of age, Living with another person, especially a relative  Total Time spent with patient: 1 hour Principal Problem: Adjustment disorder with mixed disturbance of emotions and conduct Diagnosis:  Principal Problem:   Adjustment disorder with mixed disturbance of emotions and conduct Active Problems:   Alcohol abuse   Developmental disability  Subjective Data: Patient presented to the emergency room complaining of recent depression with suicidal thoughts without acute intent.  On interview today talks about being sad about missing his little girl.  Admits to recent suicidal thoughts but says that he only has them when he is drinking.  Currently denies suicidal ideation.  Denies psychotic symptoms denies any violent ideation.  Admits that he drinks alcohol about a 40 ounce beer every other day.  Denies other drug use.  Continued Clinical Symptoms:  Alcohol Use Disorder Identification Test Final Score (AUDIT): 6 The "Alcohol Use Disorders Identification Test", Guidelines for Use in Primary Care, Second Edition.  World Pharmacologist [facility]). Score between 0-7:  no or low risk or alcohol related problems. Score between 8-15:  moderate risk of alcohol related problems. Score between 16-19:  high risk of alcohol related problems. Score 20 or above:  warrants further diagnostic evaluation for alcohol dependence and treatment.   CLINICAL FACTORS:   Depression:   Comorbid alcohol abuse/dependence Alcohol/Substance Abuse/Dependencies   Musculoskeletal: Strength & Muscle Tone: within normal limits Gait & Station: normal Patient leans: N/A  Psychiatric Specialty Exam: Physical Exam  Nursing note and vitals  reviewed. Constitutional: He appears well-developed and well-nourished.  HENT:  Head: Normocephalic and atraumatic.  Eyes: Pupils are equal, round, and reactive to light. Conjunctivae are normal.  Neck: Normal range of motion.  Cardiovascular: Regular rhythm and normal heart sounds.  Respiratory: Effort normal. No respiratory distress.  GI: Soft.  Musculoskeletal: Normal range of motion.  Neurological: He is alert.  Skin: Skin is warm and dry.  Psychiatric: His behavior is normal. Judgment and thought content normal. His affect is blunt. His speech is delayed. Cognition and memory are normal.    Review of Systems  Constitutional: Negative.   HENT: Negative.   Eyes: Negative.   Respiratory: Negative.   Cardiovascular: Negative.   Gastrointestinal: Negative.   Musculoskeletal: Negative.   Skin: Negative.   Neurological: Negative.   Psychiatric/Behavioral: Positive for substance abuse. Negative for depression, hallucinations, memory loss and suicidal ideas. The patient is not nervous/anxious and does not have insomnia.     Blood pressure (!) 152/84, pulse (!) 102, temperature 97.6 F (36.4 C), temperature source Oral, resp. rate 18, height 5\' 9"  (1.753 m), weight 101.6 kg, SpO2 100 %.Body mass index is 33.08 kg/m.  General Appearance: Casual  Eye Contact:  Fair  Speech:  Slow  Volume:  Decreased  Mood:  Dysphoric  Affect:  Congruent  Thought Process:  Goal Directed  Orientation:  Full (Time, Place, and Person)  Thought Content:  Logical  Suicidal Thoughts:  No  Homicidal Thoughts:  No  Memory:  Immediate;   Fair Recent;   Fair Remote;   Fair  Judgement:  Fair  Insight:  Fair  Psychomotor Activity:  Decreased  Concentration:  Concentration: Fair  Recall:  [employer] of Knowledge:  Fair  Language:  Fair  Akathisia:  No  Handed:  Right  AIMS (if indicated):     Assets:  Desire for Improvement Housing Resilience Social Support  ADL's:  Intact  Cognition:  Impaired,   Mild  Sleep:  Number of Hours: 6.25      COGNITIVE FEATURES THAT CONTRIBUTE TO RISK:  Loss of executive function    SUICIDE RISK:   Mild:  Suicidal ideation of limited frequency, intensity, duration, and specificity.  There are no identifiable plans, no associated intent, mild dysphoria and related symptoms, good self-control (both objective and subjective assessment), few other risk factors, and identifiable protective factors, including available and accessible social support.  PLAN OF CARE: Continue 15-minute checks.  Continue outpatient medication.  Involved in individual and group therapy.  Reassess suicidality in an ongoing way to make sure we arrange for appropriate outpatient treatment at discharge  I certify that inpatient services furnished can reasonably be expected to improve the patient's condition.   Mordecai RasmussenJohn Olof Marcil, MD 05/23/2019, 5:46 PM

## 2019-05-23 NOTE — Plan of Care (Signed)
Patient is calm and cooperative.Patient stated that he have social anxiety and anger issues.But patient agreed to try to go to the groups.Denies SI,HI and AVH.Appropriate with staff & peers.Appetite and energy level good.Support and encouragement given.

## 2019-05-24 MED ORDER — GUAIFENESIN 100 MG/5ML PO SOLN
200.0000 mg | ORAL | Status: DC | PRN
  Administered 2019-05-24: 200 mg via ORAL
  Filled 2019-05-24 (×2): qty 10

## 2019-05-24 MED ORDER — MENTHOL 3 MG MT LOZG
1.0000 | LOZENGE | OROMUCOSAL | Status: DC | PRN
  Administered 2019-05-24: 3 mg via ORAL
  Filled 2019-05-24: qty 9

## 2019-05-24 NOTE — Progress Notes (Signed)
Patient alert and oriented x 4 . Affect is flat , he brightens upon approach, interacting appropriately with peers and staff, thoughts are organized and coherent, he is complaint with medication regimen. Patient appears less anxious, denies SI/HI/AVH no distress noted will continue to monitor.

## 2019-05-24 NOTE — [facility] Group Notes (Signed)
LCSW Group Therapy Note   05/24/2019 1:15pm   Type of Therapy and Topic:  Group Therapy:  Trust and Honesty  Participation Level:  Did Not Attend  Description of Group:    In this group patients will be asked to explore the value of being honest.  Patients will be guided to discuss their thoughts, feelings, and behaviors related to honesty and trusting in others. Patients will process together how trust and honesty relate to forming relationships with peers, family members, and self. Each patient will be challenged to identify and express feelings of being vulnerable. Patients will discuss reasons why people are dishonest and identify alternative outcomes if one was truthful (to self or others). This group will be process-oriented, with patients participating in exploration of their own experiences, giving and receiving support, and processing challenge from other group members.   Therapeutic Goals: 1. Patient will identify why honesty is important to relationships and how honesty overall affects relationships.  2. Patient will identify a situation where they lied or were lied too and the  feelings, thought process, and behaviors surrounding the situation 3. Patient will identify the meaning of being vulnerable, how that feels, and how that correlates to being honest with self and others. 4. Patient will identify situations where they could have told the truth, but instead lied and explain reasons of dishonesty.   Summary of Patient Progress Pt was invited to attend group but chose not to attend. CSW will continue to encourage pt to attend group throughout their admission.     Therapeutic Modalities:   Cognitive Behavioral Therapy Solution Focused Therapy Motivational Interviewing Brief Therapy  Shailee Foots  CUEBAS-COLON, LCSW 05/24/2019 12:40 PM  

## 2019-05-24 NOTE — Progress Notes (Signed)
[facility]  05/24/2019 12:37 PM Casey StablerJohnathan [name]  MRN:  [id number]  Casey [name] is a 27yo M with psych h/o IDD, [facility] to making suicidal statements. Patient seen.  Chart reviewed. Patient discussed with nursing; no overnight events reported.  Subjective:  Patient reports "ok" mood. He complaints of sore throat and cough with minimal amount of clear sputum. He reports he is prone to common cold since childhood. He denies feeling suicidal and reports feeling less depressed, although he says he thinks the dose of his medication is "too high" and cannot list any medication side effects. He reports good night sleep. He agreed to sore throat lozenges and prn medication for cough and reported some relief after it was administered.   Principal Problem: Adjustment disorder with mixed disturbance of emotions and conduct Diagnosis: Principal Problem:   Adjustment disorder with mixed disturbance of emotions and conduct Active Problems:   Alcohol abuse   Developmental disability  Total Time spent with patient: 15 minutes  Past Psychiatric History: see H&P  Past Medical History:  Past Medical History:  Diagnosis Date  . H/O eye surgery     Past Surgical History:  Procedure Laterality Date  . HAND SURGERY     Family History:  Family History  Problem Relation Age of Onset  . Diabetes Father   . Seizures Father    Family Psychiatric  History: see H&P Social History:  Social History   Substance and Sexual Activity  Alcohol Use No     Social History   Substance and Sexual Activity  Drug Use No    Social History   Socioeconomic History  . Marital status: Single    Spouse name: Not on file  . Number of children: Not on file  . Years of education: Not on file  . Highest education level: Not on file  Occupational History  . Not on file  Social Needs  . Financial resource strain: Not on file  . Food insecurity    Worry: Not on file     Inability: Not on file  . Transportation needs    Medical: Not on file    Non-medical: Not on file  Tobacco Use  . Smoking status: Former Games developermoker  . Smokeless tobacco: Never Used  Substance and Sexual Activity  . Alcohol use: No  . Drug use: No  . Sexual activity: Not on file  Lifestyle  . Physical activity    Days per week: Not on file    Minutes per session: Not on file  . Stress: Not on file  Relationships  . Social Musicianconnections    Talks on phone: Not on file    Gets together: Not on file    Attends religious service: Not on file    Active member of club or organization: Not on file    Attends meetings of clubs or organizations: Not on file    Relationship status: Not on file  Other Topics Concern  . Not on file  Social History Narrative  . Not on file   Additional Social History:                         Sleep: Good  Appetite:  Good  Current Medications: Current Facility-Administered Medications  Medication Dose Route Frequency Provider Last Rate Last Dose  . acetaminophen (TYLENOL) tablet 650 mg  650 mg Oral Q6H PRN Money, Gerlene Burdockravis B, FNP  650 mg at 05/24/19 0831  . alum & mag hydroxide-simeth (MAALOX/MYLANTA) 200-200-20 MG/5ML suspension 30 mL  30 mL Oral Q4H PRN Money, Darnelle Maffucci B, FNP      . amLODipine (NORVASC) tablet 5 mg  5 mg Oral Daily Clapacs, Madie Reno, MD   5 mg at 05/24/19 0831  . guaiFENesin (ROBITUSSIN) 100 MG/5ML solution 200 mg  200 mg Oral Q4H PRN Larita Fife, MD      . hydrOXYzine (ATARAX/VISTARIL) tablet 25 mg  25 mg Oral TID PRN Money, Lowry Ram, FNP   25 mg at 05/23/19 2136  . lurasidone (LATUDA) tablet 40 mg  40 mg Oral Q supper Clapacs, Madie Reno, MD   40 mg at 05/23/19 1854  . magnesium hydroxide (MILK OF MAGNESIA) suspension 30 mL  30 mL Oral Daily PRN Money, Lowry Ram, FNP      . menthol-cetylpyridinium (CEPACOL) lozenge 3 mg  1 lozenge Oral PRN Larita Fife, MD      . traZODone (DESYREL) tablet 50 mg  50 mg Oral QHS PRN Money, Lowry Ram, FNP    50 mg at 05/23/19 2136    Lab Results:  Results for orders placed or performed during the hospital encounter of 05/22/19 (from the past 48 hour(s))  SARS Coronavirus 2 [facility] order, Performed in [facility] hospital lab) Nasopharyngeal Nasopharyngeal Swab     Status: None   Collection Time: 05/22/19  2:20 PM   Specimen: Nasopharyngeal Swab  Result Value Ref Range   SARS Coronavirus 2 NEGATIVE NEGATIVE    Comment: (NOTE) If result is NEGATIVE SARS-CoV-2 target nucleic acids are NOT DETECTED. The SARS-CoV-2 RNA is generally detectable in upper and lower  respiratory specimens during the acute phase of infection. The lowest  concentration of SARS-CoV-2 viral copies this assay can detect is 250  copies / mL. A negative result does not preclude SARS-CoV-2 infection  and should not be used as the sole basis for treatment or other  patient management decisions.  A negative result may occur with  improper specimen collection / handling, submission of specimen other  than nasopharyngeal swab, presence of viral mutation(s) within the  areas targeted by this assay, and inadequate number of viral copies  (<250 copies / mL). A negative result must be combined with clinical  observations, patient history, and epidemiological information. If result is POSITIVE SARS-CoV-2 target nucleic acids are DETECTED. The SARS-CoV-2 RNA is generally detectable in upper and lower  respiratory specimens dur ing the acute phase of infection.  Positive  results are indicative of active infection with SARS-CoV-2.  Clinical  correlation with patient history and other diagnostic information is  necessary to determine patient infection status.  Positive results do  not rule out bacterial infection or co-infection with other viruses. If result is PRESUMPTIVE POSTIVE SARS-CoV-2 nucleic acids MAY BE PRESENT.   A presumptive positive result was obtained on the submitted specimen  and confirmed on repeat testing.   While 2019 novel coronavirus  (SARS-CoV-2) nucleic acids may be present in the submitted sample  additional confirmatory testing may be necessary for epidemiological  and / or clinical management purposes  to differentiate between  SARS-CoV-2 and other Sarbecovirus currently known to infect humans.  If clinically indicated additional testing with an alternate test  methodology [phone]) is advised. The SARS-CoV-2 RNA is generally  detectable in upper and lower respiratory sp ecimens during the acute  phase of infection. The expected result is Negative. Fact Sheet for Patients:  StrictlyIdeas.no Fact Sheet for  Healthcare Providers: https://pope.com/https://www.fda.gov/media/136313/download This test is not yet approved or cleared by the [place] FDA and has been authorized for detection and/or diagnosis of SARS-CoV-2 by FDA under an Emergency Use Authorization (EUA).  This EUA will remain in effect (meaning this test can be used) for the duration of the COVID-19 declaration under Section 564(b)(1) of the Act, 21 U.S.C. section 360bbb-3(b)(1), unless the authorization is terminated or revoked sooner. Performed at [facility], [address] [place], [place] [address]     Blood Alcohol level:  Lab Results  Component Value Date   ETH 156 (H) 05/22/2019    Metabolic Disorder Labs: Lab Results  Component Value Date   HGBA1C 5.8 (H) 02/09/2018   MPG 119.76 02/09/2018   No results found for: PROLACTIN No results found for: CHOL, TRIG, HDL, CHOLHDL, VLDL, LDLCALC  Physical Findings: AIMS:  , ,  ,  ,    CIWA:    COWS:     Musculoskeletal: Strength & Muscle Tone: within normal limits Gait & Station: normal Patient leans: N/A  Psychiatric Specialty Exam: Physical Exam  Constitutional: He appears well-developed and well-nourished.  HENT:  Head: Normocephalic and atraumatic.  Eyes: Pupils are equal, round, and reactive to light. EOM are normal.   Neck: Normal range of motion.  Cardiovascular: Normal rate and regular rhythm.  Respiratory: Effort normal and breath sounds normal. No respiratory distress. He has no wheezes. He has no rales. He exhibits no tenderness.    ROS  Blood pressure 140/83, pulse 86, temperature 98.2 F (36.8 C), temperature source Oral, resp. rate 18, height 5\' 9"  (1.753 m), weight 101.6 kg, SpO2 99 %.Body mass index is 33.08 kg/m.  General Appearance: Casual  Eye Contact:  Good  Speech:  Normal Rate  Volume:  Normal  Mood:  Euthymic  Affect:  Constricted  Thought Process:  Goal Directed and Linear  Orientation:  Full (Time, Place, and Person)  Thought Content:  Logical  Suicidal Thoughts:  No  Homicidal Thoughts:  No  Memory:  Immediate;   Fair Recent;   Fair Remote;   Fair  Judgement:  Fair  Insight:  Fair  Psychomotor Activity:  Normal  Concentration:  Concentration: Fair and Attention Span: Fair  Recall:  [employer]Fair  Fund of Knowledge:  Fair  Language:  Good  Akathisia:  No  Handed:  Right  AIMS (if indicated):     Assets:  Communication Skills Physical Health  ADL's:  Intact  Cognition:  WNL  Sleep:  Number of Hours: 8     Treatment Plan Summary: Daily contact with patient to assess and evaluate symptoms and progress in treatment   Patient reports stable mood and denies unsafe thoughts after his psych medications were restarted. He did not express manic or psychotic symptoms as well.  Patient reports physical symptoms of sore throat and cough; objectively physical exam did not reveal any abnormalities; patient was tested covid-19 negative prior to admission. Patient given symptomatic prn medications and he reported some relief.  Plan: -continue inpatient psych admission; 15-minute checks; daily contact with patient to assess and evaluate symptoms and progress in treatment; psychoeducation. -continue Latuda 40mg  po daily for mood and Trazodone 50mg  PO QHS PRN sleep. -Disposition: to be  determined by a main treatment team on Monday.   Thalia PartyAlisa Aspin Palomarez, MD 05/24/2019, 12:37 PM

## 2019-05-24 NOTE — Progress Notes (Signed)
D: Patient remains isolative to his room and to self. Observed ambulating the halls to go get his meals, medications and water from the community room. States that his sleep last night was good without the use of a sleep aid. Reports that his appetite is good with normal energy and good concentration. Rates his depression, hopelessness and anxiety a 0. Denies SI/HI/AVH at this time.  A: Encouraged patient participation with unit programming. Education provided on medication and treatment plans. Patient given support and encouragement to be proactive with his treatment plan.   R:  Patient continues to be monitored Q 15 minutes for safety per unit protocol. Patient remains safe on the unit

## 2019-05-24 NOTE — Plan of Care (Signed)
Patient is present in the milieu, affect is flat but brightens upon approach.  Complains of sore throat today, prn medications administered with some relief. Will continue to monitor.

## 2019-05-25 NOTE — Progress Notes (Signed)
Patient was active in the milieu at the beginning of this shift. Ate breakfast then presented to the medication room, pleasant and cooperative. Discussed his mental health and stated "I've got to get better for my daughter". Patient stated that he is currently unable to get around his daughter and "baby mama" because of his substance use behaviors. Expressed motivation for treatment. Denying thoughts of self harm. Denying hallucinations.  Patient ate lunch and was in bed toward the end of the shift. Refused dinner reporting that he was not hungry. Reported that he usually eats one meal and added "I am trying to lose some weight..I will be alright". Did not have any main concern throughout the shift. Staff continue to provide support and encouragements. Safety precautions maintained per unit protocol.

## 2019-05-25 NOTE — Plan of Care (Signed)
  Problem: Education: Goal: Utilization of techniques to improve thought processes will improve Outcome: Progressing Goal: Knowledge of the prescribed therapeutic regimen will improve Outcome: Progressing  D: Patient has been isolative to room and to self. Denies SI, HI and AVH. Mood is sad. Affect is flat. Appears internally preoccupied. A: Continue to monitor for safety R: Safety maintained.

## 2019-05-25 NOTE — Plan of Care (Signed)
Visible in the milieu, active and cooperative. Motivated for treatment "I need to get better, my daughter wants me to do better, she asked me to get treatment.Marland KitchenMarland Kitchen"

## 2019-05-25 NOTE — [facility] Group Notes (Signed)
LCSW Group Therapy Note 05/25/2019 1:15pm  Type of Therapy and Topic: Group Therapy: Feelings Around Returning Home & Establishing a Supportive Framework and Supporting Oneself When Supports Not Available  Participation Level: Did Not Attend  Description of Group:  Patients first processed thoughts and feelings about upcoming discharge. These included fears of upcoming changes, lack of change, new living environments, judgements and expectations from others and overall stigma of mental health issues. The group then discussed the definition of a supportive framework, what that looks and feels like, and how do to discern it from an unhealthy non-supportive network. The group identified different types of supports as well as what to do when your family/friends are less than helpful or unavailable  Therapeutic Goals  1. Patient will identify one healthy supportive network that they can use at discharge. 2. Patient will identify one factor of a supportive framework and how to tell it from an unhealthy network. 3. Patient able to identify one coping skill to use when they do not have positive supports from others. 4. Patient will demonstrate ability to communicate their needs through discussion and/or role plays.  Summary of Patient Progress:  Pt was invited to attend group but chose not to attend. CSW will continue to encourage pt to attend group throughout their admission.  Therapeutic Modalities Cognitive Behavioral Therapy Motivational Interviewing   Haide Klinker  CUEBAS-COLON, LCSW 05/25/2019 12:34 PM  

## 2019-05-25 NOTE — Progress Notes (Signed)
[facility]  05/25/2019 10:02 AM Casey StablerJohnathan [name]  MRN:  [id number]  Casey [name] is a 27yo M with psych h/o IDD, [facility] due to making suicidal statements. Patient seen.  Chart reviewed. Patient discussed with nursing; no overnight events reported.  Subjective:  Patient reports "feeling good". She reports improvement of sore throat and cough after initiation of symptomatic treatment. Reports feeling"better" overall, no new symptoms. He states he might have an exacerbation of his seasonal allergy as well. He reports good mood. He denies feeling suicidal and reports feeling less depressed. Per nursing, patient appeared sad and not engaged, patient states he was just bored. Denies hallucinations.  He cannot list any medication side effects .He reports he slept last night, although per chart he slept only 1.5 h..  Principal Problem: Adjustment disorder with mixed disturbance of emotions and conduct Diagnosis: Principal Problem:   Adjustment disorder with mixed disturbance of emotions and conduct Active Problems:   Alcohol abuse   Developmental disability  Total Time spent with patient: 15 minutes  Past Psychiatric History: see H&P  Past Medical History:  Past Medical History:  Diagnosis Date  . H/O eye surgery     Past Surgical History:  Procedure Laterality Date  . HAND SURGERY     Family History:  Family History  Problem Relation Age of Onset  . Diabetes Father   . Seizures Father    Family Psychiatric  History: see H&P Social History:  Social History   Substance and Sexual Activity  Alcohol Use No     Social History   Substance and Sexual Activity  Drug Use No    Social History   Socioeconomic History  . Marital status: Single    Spouse name: Not on file  . Number of children: Not on file  . Years of education: Not on file  . Highest education level: Not on file  Occupational History  . Not on file  Social Needs  .  Financial resource strain: Not on file  . Food insecurity    Worry: Not on file    Inability: Not on file  . Transportation needs    Medical: Not on file    Non-medical: Not on file  Tobacco Use  . Smoking status: Former Games developermoker  . Smokeless tobacco: Never Used  Substance and Sexual Activity  . Alcohol use: No  . Drug use: No  . Sexual activity: Not on file  Lifestyle  . Physical activity    Days per week: Not on file    Minutes per session: Not on file  . Stress: Not on file  Relationships  . Social Musicianconnections    Talks on phone: Not on file    Gets together: Not on file    Attends religious service: Not on file    Active member of club or organization: Not on file    Attends meetings of clubs or organizations: Not on file    Relationship status: Not on file  Other Topics Concern  . Not on file  Social History Narrative  . Not on file   Additional Social History:                         Sleep: Good  Appetite:  Good  Current Medications: Current Facility-Administered Medications  Medication Dose Route Frequency Provider Last Rate Last Dose  . acetaminophen (TYLENOL) tablet 650 mg  650 mg Oral  Q6H PRN Money, Lowry Ram, FNP   650 mg at 05/24/19 2002  . alum & mag hydroxide-simeth (MAALOX/MYLANTA) 200-200-20 MG/5ML suspension 30 mL  30 mL Oral Q4H PRN Money, Darnelle Maffucci B, FNP      . amLODipine (NORVASC) tablet 5 mg  5 mg Oral Daily Clapacs, Madie Reno, MD   5 mg at 05/25/19 0850  . guaiFENesin (ROBITUSSIN) 100 MG/5ML solution 200 mg  200 mg Oral Q4H PRN Larita Fife, MD   200 mg at 05/24/19 1613  . hydrOXYzine (ATARAX/VISTARIL) tablet 25 mg  25 mg Oral TID PRN Money, Lowry Ram, FNP   25 mg at 05/23/19 2136  . lurasidone (LATUDA) tablet 40 mg  40 mg Oral Q supper Clapacs, Madie Reno, MD   40 mg at 05/24/19 1612  . magnesium hydroxide (MILK OF MAGNESIA) suspension 30 mL  30 mL Oral Daily PRN Money, Lowry Ram, FNP      . menthol-cetylpyridinium (CEPACOL) lozenge 3 mg  1 lozenge  Oral PRN Larita Fife, MD   3 mg at 05/24/19 1613  . traZODone (DESYREL) tablet 50 mg  50 mg Oral QHS PRN Money, Lowry Ram, FNP   50 mg at 05/23/19 2136    Lab Results:  No results found for this or any previous visit (from the past 48 hour(s)).  Blood Alcohol level:  Lab Results  Component Value Date   ETH 156 (H) 40/98/1191    Metabolic Disorder Labs: Lab Results  Component Value Date   HGBA1C 5.8 (H) 02/09/2018   MPG 119.76 02/09/2018   No results found for: PROLACTIN No results found for: CHOL, TRIG, HDL, CHOLHDL, VLDL, LDLCALC  Physical Findings: AIMS:  , ,  ,  ,    CIWA:    COWS:     Musculoskeletal: Strength & Muscle Tone: within normal limits Gait & Station: normal Patient leans: N/A  Psychiatric Specialty Exam: Physical Exam  Constitutional: He appears well-developed and well-nourished.  HENT:  Head: Normocephalic and atraumatic.  Eyes: Pupils are equal, round, and reactive to light. EOM are normal.  Neck: Normal range of motion.  Cardiovascular: Normal rate and regular rhythm.  Respiratory: Effort normal and breath sounds normal. No respiratory distress. He has no wheezes. He has no rales. He exhibits no tenderness.    ROS   Blood pressure (!) 137/99, pulse 84, temperature 98.8 F (37.1 C), temperature source Oral, resp. rate 18, height 5\' 9"  (1.753 m), weight 101.6 kg, SpO2 100 %.Body mass index is 33.08 kg/m.  General Appearance: Casual  Eye Contact:  Good  Speech:  Normal Rate  Volume:  Normal  Mood:  Euthymic  Affect:  Constricted  Thought Process:  Goal Directed and Linear  Orientation:  Full (Time, Place, and Person)  Thought Content:  Logical  Suicidal Thoughts:  No  Homicidal Thoughts:  No  Memory:  Immediate;   Fair Recent;   Fair Remote;   Fair  Judgement:  Fair  Insight:  Fair  Psychomotor Activity:  Normal  Concentration:  Concentration: Fair and Attention Span: Fair  Recall:  [employer] of Knowledge:  Fair  Language:  Good   Akathisia:  No  Handed:  Right  AIMS (if indicated):     Assets:  Communication Skills Physical Health  ADL's:  Intact  Cognition:  WNL  Sleep:  Number of Hours: 1.25     Treatment Plan Summary: Daily contact with patient to assess and evaluate symptoms and progress in treatment   Patient reports stable  mood and denies unsafe thoughts after his psych medications were restarted. He did not express manic or psychotic symptoms as well. Per nursing report, patient appears depressed and his sleep remains poor. Patient reports his physical symptoms of sore throat and cough are improving as well. No new complaints today. Will continue current medications. Patient is not willing to increase the dose of antipsychotic.  Plan: -continue inpatient psych admission; 15-minute checks; daily contact with patient to assess and evaluate symptoms and progress in treatment; psychoeducation. -continue Latuda 40mg  po daily for mood and Trazodone 50mg  PO QHS PRN sleep. -Disposition: to be determined by a main treatment team on Monday.   Thalia PartyAlisa Knight Oelkers, MD 05/25/2019, 10:02 AM

## 2019-05-25 NOTE — Progress Notes (Signed)
D: Patient has been isolative to room and to self. Denies SI, HI and AVH. Mood is sad. Affect is flat. Appears internally preoccupied. A: Continue to monitor for safety R: Safety maintained.

## 2019-05-26 MED ORDER — TRAZODONE HCL 50 MG PO TABS: 50.0000 mg | ORAL_TABLET | Freq: Every evening | ORAL | 0 refills | Status: DC | PRN

## 2019-05-26 MED ORDER — AMLODIPINE BESYLATE 5 MG PO TABS: 5.0000 mg | ORAL_TABLET | Freq: Every day | ORAL | 0 refills | Status: DC

## 2019-05-26 MED ORDER — HYDROXYZINE HCL 25 MG PO TABS: 25.0000 mg | ORAL_TABLET | Freq: Three times a day (TID) | ORAL | 0 refills | Status: DC | PRN

## 2019-05-26 MED ORDER — LURASIDONE HCL 40 MG PO TABS: 40.0000 mg | ORAL_TABLET | Freq: Every day | ORAL | 0 refills | Status: DC

## 2019-05-26 NOTE — Plan of Care (Signed)
Patients affect is brighter and thinks more positive now.Patient was reluctant to go to groups because of his social anxiety.Patient attended groups,stated that it was helpful.Denies SI,HI and AVH.Compliant with medications.Appetite and energy level good.Support and encouragement given.

## 2019-05-26 NOTE — Progress Notes (Signed)
Recreation Therapy Notes   Date: 05/26/2019  Time: 9:30 am   Location: Craft room   Behavioral response: N/A   Intervention Topic: Time-Management  Discussion/Intervention: Patient did not attend group.   Clinical Observations/Feedback:  Patient did not attend group.   Casey [name] LRT/CTRS        Casey [name] 05/26/2019 10:22 AM 

## 2019-05-26 NOTE — Plan of Care (Signed)
  Problem: Education: Goal: Utilization of techniques to improve thought processes will improve Outcome: Progressing Goal: Knowledge of the prescribed therapeutic regimen will improve Outcome: Progressing  D: Patient has been calm and cooperative. Denies SI, HI and AVH. Isolative to self and room. Mood is pleasant but sad. Affect is appropriate to circumstance. A: Continue to monitor for safety R: Safety maintained.

## 2019-05-26 NOTE — Progress Notes (Signed)
   05/26/19 1000  Clinical Encounter Type  Visited With Patient  Visit Type Follow-up  Ch followed up to check in with the pt. Pt said he was feeling nervous waiting to hear back from the care team whether he can be discharged today or not. Pt feels ready to go home and tries to stay positive. Ch gave encouragement. The visit was appreciated.

## 2019-05-26 NOTE — BHH Group Notes (Signed)
LCSW Group Therapy Note   05/26/2019 11:06 AM   Type of Therapy and Topic:  Group Therapy:  Overcoming Obstacles   Participation Level:  Active   Description of Group:    In this group patients will be encouraged to explore what they see as obstacles to their own wellness and recovery. They will be guided to discuss their thoughts, feelings, and behaviors related to these obstacles. The group will process together ways to cope with barriers, with attention given to specific choices patients can make. Each patient will be challenged to identify changes they are motivated to make in order to overcome their obstacles. This group will be process-oriented, with patients participating in exploration of their own experiences as well as giving and receiving support and challenge from other group members.   Therapeutic Goals: 1. Patient will identify personal and current obstacles as they relate to admission. 2. Patient will identify barriers that currently interfere with their wellness or overcoming obstacles.  3. Patient will identify feelings, thought process and behaviors related to these barriers. 4. Patient will identify two changes they are willing to make to overcome these obstacles:      Summary of Patient Progress Pt was appropriate and respectful in group. Pt was able to identify a current obstacle for him as communicating with his family and letting them know how much he loves them. Pt reported that a barrier to him overcoming his obstacle is his anger and not smiling enough. Pt was able to identify multiple coping skills that he can implement when he becomes overwhelmed with his obstacles.     Therapeutic Modalities:   Cognitive Behavioral Therapy Solution Focused Therapy Motivational Interviewing Relapse Prevention Therapy  Evalina Field, MSW, LCSW Clinical Social Work 05/26/2019 11:06 AM

## 2019-05-26 NOTE — Progress Notes (Signed)
[facility] Progress Note  05/26/2019 4:48 PM Casey StablerJohnathan [name]  MRN:  [id number] Subjective: Follow-up for this patient with intellectual impairment and mood swings.  Patient has been calm and appropriate through the weekend and remains so today.  Mood stated as being fine.  No aggression no violence no threats and denies suicidal ideation.  Has been cooperative with treatment in the hospital physically stable.  Patient says that he thinks he can go stay with his grandmother but he has not talked with her yet. Principal Problem: Adjustment disorder with mixed disturbance of emotions and conduct Diagnosis: Principal Problem:   Adjustment disorder with mixed disturbance of emotions and conduct Active Problems:   Alcohol abuse   Developmental disability  Total Time spent with patient: 20 minutes  Past Psychiatric History: History of developmental disability and some mood instability  Past Medical History:  Past Medical History:  Diagnosis Date  . H/O eye surgery     Past Surgical History:  Procedure Laterality Date  . HAND SURGERY     Family History:  Family History  Problem Relation Age of Onset  . Diabetes Father   . Seizures Father    Family Psychiatric  History: See previous Social History:  Social History   Substance and Sexual Activity  Alcohol Use No     Social History   Substance and Sexual Activity  Drug Use No    Social History   Socioeconomic History  . Marital status: Single    Spouse name: Not on file  . Number of children: Not on file  . Years of education: Not on file  . Highest education level: Not on file  Occupational History  . Not on file  Social Needs  . Financial resource strain: Not on file  . Food insecurity    Worry: Not on file    Inability: Not on file  . Transportation needs    Medical: Not on file    Non-medical: Not on file  Tobacco Use  . Smoking status: Former Games developermoker  . Smokeless tobacco: Never Used  Substance and Sexual Activity   . Alcohol use: No  . Drug use: No  . Sexual activity: Not on file  Lifestyle  . Physical activity    Days per week: Not on file    Minutes per session: Not on file  . Stress: Not on file  Relationships  . Social Musicianconnections    Talks on phone: Not on file    Gets together: Not on file    Attends religious service: Not on file    Active member of club or organization: Not on file    Attends meetings of clubs or organizations: Not on file    Relationship status: Not on file  Other Topics Concern  . Not on file  Social History Narrative  . Not on file   Additional Social History:                         Sleep: Fair  Appetite:  Fair  Current Medications: Current Facility-Administered Medications  Medication Dose Route Frequency Provider Last Rate Last Dose  . acetaminophen (TYLENOL) tablet 650 mg  650 mg Oral Q6H PRN Money, Gerlene Burdockravis B, FNP   650 mg at 05/26/19 0220  . alum & mag hydroxide-simeth (MAALOX/MYLANTA) 200-200-20 MG/5ML suspension 30 mL  30 mL Oral Q4H PRN Money, Feliz Beamravis B, FNP      . amLODipine (NORVASC) tablet 5 mg  5 mg  Oral Daily Brylei Pedley, [name], MD   5 mg at 05/26/19 [phone]  . guaiFENesin (ROBITUSSIN) 100 MG/5ML solution 200 mg  200 mg Oral Q4H PRN Thalia PartyPaliy, Alisa, MD   200 mg at 05/24/19 1613  . hydrOXYzine (ATARAX/VISTARIL) tablet 25 mg  25 mg Oral TID PRN Money, Gerlene Burdockravis B, FNP   25 mg at 05/23/19 2136  . lurasidone (LATUDA) tablet 40 mg  40 mg Oral Q supper Khristy Kalan, [name], MD   40 mg at 05/26/19 1642  . magnesium hydroxide (MILK OF MAGNESIA) suspension 30 mL  30 mL Oral Daily PRN Money, Gerlene Burdockravis B, FNP      . menthol-cetylpyridinium (CEPACOL) lozenge 3 mg  1 lozenge Oral PRN Thalia PartyPaliy, Alisa, MD   3 mg at 05/24/19 1613  . traZODone (DESYREL) tablet 50 mg  50 mg Oral QHS PRN Money, Gerlene Burdockravis B, FNP   50 mg at 05/23/19 2136    Lab Results: No results found for this or any previous visit (from the past 48 hour(s)).  Blood Alcohol level:  Lab Results  Component Value  Date   ETH 156 (H) 05/22/2019    Metabolic Disorder Labs: Lab Results  Component Value Date   HGBA1C 5.8 (H) 02/09/2018   MPG 119.76 02/09/2018   No results found for: PROLACTIN No results found for: CHOL, TRIG, HDL, CHOLHDL, VLDL, LDLCALC  Physical Findings: AIMS:  , ,  ,  ,    CIWA:    COWS:     Musculoskeletal: Strength & Muscle Tone: within normal limits Gait & Station: normal Patient leans: N/A  Psychiatric Specialty Exam: Physical Exam  Nursing note and vitals reviewed. Constitutional: He appears well-developed and well-nourished.  HENT:  Head: Normocephalic and atraumatic.  Eyes: Pupils are equal, round, and reactive to light. Conjunctivae are normal.  Neck: Normal range of motion.  Cardiovascular: Regular rhythm and normal heart sounds.  Respiratory: Effort normal. No respiratory distress.  GI: Soft.  Musculoskeletal: Normal range of motion.  Neurological: He is alert.  Skin: Skin is warm and dry.  Psychiatric: He has a normal mood and affect. His behavior is normal. Judgment and thought content normal.    Review of Systems  Constitutional: Negative.   HENT: Negative.   Eyes: Negative.   Respiratory: Negative.   Cardiovascular: Negative.   Gastrointestinal: Negative.   Musculoskeletal: Negative.   Skin: Negative.   Neurological: Negative.   Psychiatric/Behavioral: Negative.     Blood pressure (!) 138/95, pulse 79, temperature 98.3 F (36.8 C), temperature source Oral, resp. rate 18, height 5\' 9"  (1.753 m), weight 101.6 kg, SpO2 99 %.Body mass index is 33.08 kg/m.  General Appearance: Casual  Eye Contact:  Good  Speech:  Clear and Coherent  Volume:  Normal  Mood:  Euthymic  Affect:  Constricted  Thought Process:  Goal Directed  Orientation:  Full (Time, Place, and Person)  Thought Content:  Logical  Suicidal Thoughts:  No  Homicidal Thoughts:  No  Memory:  Immediate;   Fair Recent;   Fair Remote;   Fair  Judgement:  Fair  Insight:  Fair   Psychomotor Activity:  Decreased  Concentration:  Concentration: Fair  Recall:  [employer]Fair  Fund of Knowledge:  Fair  Language:  Fair  Akathisia:  No  Handed:  Right  AIMS (if indicated):     Assets:  Desire for Improvement  ADL's:  Intact  Cognition:  Impaired,  Mild  Sleep:  Number of Hours: 5.5     Treatment Plan Summary:  Plan Patient appears to be doing quite well and is probably at his baseline.  We will start making arrangements for likely discharge tomorrow.  Alethia Berthold, MD 05/26/2019, 4:48 PM

## 2019-05-26 NOTE — Progress Notes (Signed)
D: Patient has been calm and cooperative. Denies SI, HI and AVH. Isolative to self and room. Mood is pleasant but sad. Affect is appropriate to circumstance. A: Continue to monitor for safety R: Safety maintained.

## 2019-05-27 MED ORDER — LURASIDONE HCL 40 MG PO TABS: 40.0000 mg | ORAL_TABLET | Freq: Every day | ORAL | 1 refills | Status: DC

## 2019-05-27 MED ORDER — HYDROXYZINE HCL 25 MG PO TABS: 25.0000 mg | ORAL_TABLET | Freq: Three times a day (TID) | ORAL | 1 refills | Status: DC | PRN

## 2019-05-27 MED ORDER — AMLODIPINE BESYLATE 5 MG PO TABS: 5.0000 mg | ORAL_TABLET | Freq: Every day | ORAL | 1 refills | Status: DC

## 2019-05-27 MED ORDER — TRAZODONE HCL 50 MG PO TABS: 50.0000 mg | ORAL_TABLET | Freq: Every evening | ORAL | 1 refills | Status: DC | PRN

## 2019-05-27 NOTE — Discharge Summary (Signed)
Physician Discharge Summary Note  Patient:  Casey [name] is an 28 y.o., male MRN:  [id number] DOB:  October 30, 1990 Patient phone:  [phone] (home)  Patient address:   [address] [place] [address],  Total Time spent with patient: 45 minutes  Date of Admission:  05/22/2019 Date of Discharge: May 27, 2019  Reason for Admission: Admitted because of transient statements of suicidal ideation and behavior problems in the context of chronic developmental disability  Principal Problem: Adjustment disorder with mixed disturbance of emotions and conduct Discharge Diagnoses: Principal Problem:   Adjustment disorder with mixed disturbance of emotions and conduct Active Problems:   Alcohol abuse   Developmental disability   Past Psychiatric History: Past history of developmental disability mood instability alcohol abuse  Past Medical History:  Past Medical History:  Diagnosis Date  . H/O eye surgery     Past Surgical History:  Procedure Laterality Date  . HAND SURGERY     Family History:  Family History  Problem Relation Age of Onset  . Diabetes Father   . Seizures Father    Family Psychiatric  History: See previous Social History:  Social History   Substance and Sexual Activity  Alcohol Use No     Social History   Substance and Sexual Activity  Drug Use No    Social History   Socioeconomic History  . Marital status: Single    Spouse name: Not on file  . Number of children: Not on file  . Years of education: Not on file  . Highest education level: Not on file  Occupational History  . Not on file  Social Needs  . Financial resource strain: Not on file  . Food insecurity    Worry: Not on file    Inability: Not on file  . Transportation needs    Medical: Not on file    Non-medical: Not on file  Tobacco Use  . Smoking status: Former Games developermoker  . Smokeless tobacco: Never Used  Substance and Sexual Activity  . Alcohol use: No  . Drug use: No  .  Sexual activity: Not on file  Lifestyle  . Physical activity    Days per week: Not on file    Minutes per session: Not on file  . Stress: Not on file  Relationships  . Social Musicianconnections    Talks on phone: Not on file    Gets together: Not on file    Attends religious service: Not on file    Active member of club or organization: Not on file    Attends meetings of clubs or organizations: Not on file    Relationship status: Not on file  Other Topics Concern  . Not on file  Social History Narrative  . Not on file    Hospital Course: Patient was admitted to the psychiatric unit.  15-minute checks were employed.  Patient did not show any dangerous aggressive or violent behavior in the hospital.  He was calm and cooperative throughout his time here.  Medications were continued with Latuda as the primary agent.  Patient was not continued on stimulants in the hospital.  Medically he appeared to be quite stable.  Patient did not appear to need further hospitalizations or be acutely dangerous to himself or others.  Father notified social work that the patient was not able to come back and return to his dwelling and therefore we needed to find a new place for the patient to live.  As of the day of  discharge his aunt has told me that she will come and pick him up and he can live with her and she feels comfortable with that.  He has follow-up arranged through [facility]  Physical Findings: AIMS:  , ,  ,  ,    CIWA:    COWS:     Musculoskeletal: Strength & Muscle Tone: within normal limits Gait & Station: normal Patient leans: N/A  Psychiatric Specialty Exam: Physical Exam  Nursing note and vitals reviewed. Constitutional: He appears well-developed and well-nourished.  HENT:  Head: Normocephalic and atraumatic.  Eyes: Pupils are equal, round, and reactive to light. Conjunctivae are normal.  Neck: Normal range of motion.  Cardiovascular: Regular rhythm and normal heart sounds.   Respiratory: Effort normal.  GI: Soft.  Musculoskeletal: Normal range of motion.  Neurological: He is alert.  Skin: Skin is warm and dry.  Psychiatric: His mood appears not anxious. His affect is blunt. His affect is not angry. His speech is delayed. He is slowed. Thought content is not paranoid. Cognition and memory are impaired. He expresses impulsivity. He does not exhibit a depressed mood. He expresses no homicidal and no suicidal ideation.    Review of Systems  Constitutional: Negative.   HENT: Negative.   Eyes: Negative.   Respiratory: Negative.   Cardiovascular: Negative.   Gastrointestinal: Negative.   Musculoskeletal: Negative.   Skin: Negative.   Neurological: Negative.   Psychiatric/Behavioral: Negative.     Blood pressure (!) 141/107, pulse 82, temperature 98.1 F (36.7 C), temperature source Oral, resp. rate 18, height 5\' 9"  (1.753 m), weight 101.6 kg, SpO2 100 %.Body mass index is 33.08 kg/m.  General Appearance: Casual  Eye Contact:  Good  Speech:  Slow  Volume:  Decreased  Mood:  Euthymic  Affect:  Congruent  Thought Process:  Goal Directed  Orientation:  Full (Time, Place, and Person)  Thought Content:  Logical  Suicidal Thoughts:  No  Homicidal Thoughts:  No  Memory:  Immediate;   Fair Recent;   Fair Remote;   Fair  Judgement:  Fair  Insight:  Fair  Psychomotor Activity:  Decreased  Concentration:  Concentration: Fair  Recall:  [employer]Fair  Fund of Knowledge:  Fair  Language:  Fair  Akathisia:  No  Handed:  Right  AIMS (if indicated):     Assets:  Desire for Improvement  ADL's:  Intact  Cognition:  Impaired,  Mild  Sleep:  Number of Hours: 7.75     Have you used any form of tobacco in the last 30 days? (Cigarettes, Smokeless Tobacco, Cigars, and/or Pipes): No  Has this patient used any form of tobacco in the last 30 days? (Cigarettes, Smokeless Tobacco, Cigars, and/or Pipes) Yes, No  Blood Alcohol level:  Lab Results  Component Value Date   ETH  156 (H) 05/22/2019    Metabolic Disorder Labs:  Lab Results  Component Value Date   HGBA1C 5.8 (H) 02/09/2018   MPG 119.76 02/09/2018   No results found for: PROLACTIN No results found for: CHOL, TRIG, HDL, CHOLHDL, VLDL, LDLCALC  See Psychiatric Specialty Exam and Suicide Risk Assessment completed by Attending Physician prior to discharge.  Discharge destination:  Home  Is patient on multiple antipsychotic therapies at discharge:  No   Has Patient had three or more failed trials of antipsychotic monotherapy by history:  No  Recommended Plan for Multiple Antipsychotic Therapies: NA  Discharge Instructions    Diet - low sodium heart healthy   Complete by: As  directed    Increase activity slowly   Complete by: As directed      Allergies as of 05/27/2019   No Known Allergies     Medication List    STOP taking these medications   hydrOXYzine 50 MG capsule Commonly known as: VISTARIL   Vyvanse 30 MG capsule Generic drug: lisdexamfetamine     TAKE these medications     Indication  amLODipine 5 MG tablet Commonly known as: NORVASC Take 1 tablet (5 mg total) by mouth daily.  Indication: High Blood Pressure Disorder   hydrOXYzine 25 MG tablet Commonly known as: ATARAX/VISTARIL Take 1 tablet (25 mg total) by mouth 3 (three) times daily as needed for anxiety.  Indication: Feeling Anxious   lurasidone 40 MG Tabs tablet Commonly known as: LATUDA Take 1 tablet (40 mg total) by mouth daily with supper. What changed: when to take this  Indication: Depressive Phase of Manic-Depression   traZODone 50 MG tablet Commonly known as: DESYREL Take 1 tablet (50 mg total) by mouth at bedtime as needed for sleep.  Indication: [place]. Go on 05/30/2019.   Why: You are scheduled to meet with Adele Barthel, NP on Friday, August 21 at 1030am and Ms. [place], therapist on August 25 at 11am. Thank you. Contact  information: [place] [address] [phone]           Follow-up recommendations:  Activity:  Activity as tolerated Diet:  Regular diet Other:  Do not return to alcohol use do not use drugs.  Stay on current medication and follow-up with outpatient appointment at Why: Patient provided with samples as well as prescriptions at discharge  Signed: Alethia Berthold, MD 05/27/2019, 2:37 PM

## 2019-05-27 NOTE — Progress Notes (Signed)
Recreation Therapy Notes  Date: 05/27/2019  Time: 9:30 am  Location: Craft room  Behavioral response: Appropriate   Intervention Topic: Coping-Skills  Discussion/Intervention:  Group content on today was focused on coping skills. The group defined what coping skills are and when they can be used. Individuals described how they normally cope with thing and the coping skills they normally use. Patients expressed why it is important to cope with things and how not coping with things can affect you. The group participated in the intervention "Exploring coping skills" where they had a chance to test new coping skills they could use in the future.  Clinical Observations/Feedback:  Patient came to group and identified taking a quick walk, watching movies and positive self talk as coping skills he could use outside of the hospital.  Individual was social with peers and staff while participating in group.  Wrenn Willcox LRT/CTRS          Taniyah Ballow 05/27/2019 11:55 AM

## 2019-05-27 NOTE — BHH Suicide Risk Assessment (Signed)
[facility] Discharge Suicide Risk Assessment   Principal Problem: Adjustment disorder with mixed disturbance of emotions and conduct Discharge Diagnoses: Principal Problem:   Adjustment disorder with mixed disturbance of emotions and conduct Active Problems:   Alcohol abuse   Developmental disability   Total Time spent with patient: 45 minutes  Musculoskeletal: Strength & Muscle Tone: within normal limits Gait & Station: normal Patient leans: N/A  Psychiatric Specialty Exam: Review of Systems  Constitutional: Negative.   HENT: Negative.   Eyes: Negative.   Respiratory: Negative.   Cardiovascular: Negative.   Gastrointestinal: Negative.   Musculoskeletal: Negative.   Skin: Negative.   Neurological: Negative.   Psychiatric/Behavioral: Negative.     Blood pressure (!) 141/107, pulse 82, temperature 98.1 F (36.7 C), temperature source Oral, resp. rate 18, height 5\' 9"  (1.753 m), weight 101.6 kg, SpO2 100 %.Body mass index is 33.08 kg/m.  General Appearance: Casual  Eye Contact::  Good  Speech:  Slow409  Volume:  Decreased  Mood:  Euthymic  Affect:  Congruent  Thought Process:  Goal Directed  Orientation:  Full (Time, Place, and Person)  Thought Content:  Logical  Suicidal Thoughts:  No  Homicidal Thoughts:  No  Memory:  Immediate;   Fair Recent;   Fair Remote;   Fair  Judgement:  Fair  Insight:  Fair  Psychomotor Activity:  Normal  Concentration:  Fair  Recall:  [employer] [place]  Language: Fair  Akathisia:  No  Handed:  Right  AIMS (if indicated):     Assets:  Desire for Improvement Housing Physical Health Resilience Social Support  Sleep:  Number of Hours: 7.75  Cognition: WNL  ADL's:  Intact   Mental Status Per Nursing Assessment::   On Admission:  NA  Demographic Factors:  Male  Loss Factors: NA  Historical Factors: Impulsivity  Risk Reduction Factors:   Living with another person, especially a relative, Positive social support and  Positive therapeutic relationship  Continued Clinical Symptoms:  Depression:   Impulsivity  Cognitive Features That Contribute To Risk:  None    Suicide Risk:  Minimal: No identifiable suicidal ideation.  Patients presenting with no risk factors but with morbid ruminations; may be classified as minimal risk based on the severity of the depressive symptoms  Follow-up [place]. Go on 05/30/2019.   Why: You are scheduled to meet with Adele Barthel, NP on Friday, August 21 at 1030am and Ms. [place], therapist on August 25 at 11am. Thank you. Contact information: [place] [address] [phone]           Plan Of Care/Follow-up recommendations:  Activity:  Activity as tolerated Diet:  Regular diet Other:  Follow-up with [place] as scheduled  Alethia Berthold, MD 05/27/2019, 2:32 PM

## 2019-05-27 NOTE — Progress Notes (Signed)
  [facility] Discharge Plan :  Will you be returning to the same living situation after discharge:  No. At discharge, do you have transportation home?: Yes,  aunt will pick pt up at 4 Do you have the ability to pay for your medications: Yes,  mental health  Release of information consent forms completed and in the chart;    Patient to Follow up at: Follow-up [place]. Go on 05/30/2019.   Why: You are scheduled to meet with Adele Barthel, NP on Friday, August 21 at 1030am and Ms. [place], therapist on August 25 at 11am. Thank you. Contact information: [place] [address] [phone]           Next level of care provider has access to [facility] and Suicide Prevention discussed: Yes,  SPE completed with pts father  Have you used any form of tobacco in the last 30 days? (Cigarettes, Smokeless Tobacco, Cigars, and/or Pipes): No  Has patient been referred to the Quitline?: N/A patient is not a smoker  Patient has been referred for addiction treatment: [place], LCSW 05/27/2019, 2:20 PM

## 2019-05-27 NOTE — [facility] Counselor (Signed)
LCSW Group Therapy Note  05/27/2019 1:0) PM  Type of Therapy/Topic:  Group Therapy:  Feelings about Diagnosis  Participation Level:  Did Not Attend   Description of Group:   This group will allow patients to explore their thoughts and feelings about diagnoses they have received. Patients will be guided to explore their level of understanding and acceptance of these diagnoses. Facilitator will encourage patients to process their thoughts and feelings about the reactions of others to their diagnosis and will guide patients in identifying ways to discuss their diagnosis with significant others in their lives. This group will be process-oriented, with patients participating in exploration of their own experiences, giving and receiving support, and processing challenge from other group members.   Therapeutic Goals: 1. Patient will demonstrate understanding of diagnosis as evidenced by identifying two or more symptoms of the disorder 2. Patient will be able to express two feelings regarding the diagnosis 3. Patient will demonstrate their ability to communicate their needs through discussion and/or role play  Summary of Patient Progress: X  Therapeutic Modalities:   Cognitive Behavioral Therapy Brief Therapy Feelings Identification   Assunta Curtis, MSW, LCSW 05/27/2019 2:07 PM

## 2019-05-27 NOTE — Plan of Care (Signed)
  Problem: Group Participation Goal: STG - Patient will engage in groups without prompting or encouragement from LRT x3 group sessions within 5 recreation therapy group sessions Description: STG - Patient will engage in groups without prompting or encouragement from LRT x3 group sessions within 5 recreation therapy group sessions 05/27/2019 1159 by Ernest Haber, LRT Outcome: Not Applicable [id number] by Ernest Haber, LRT Outcome: Not Met (add Reason) Note: Patient spent most of his time in his room

## 2019-05-27 NOTE — Progress Notes (Signed)
Patient alert and oriented x 4, affect is flat but she brightens upon approach, interacting appropriately with peers and staff, thoughts are organized and coherent, he is complaint with medication regimen, he isolates to self, minimal interaction with staff. Patient appears less anxious, denies SI/HI/AVH no distress noted will continue to monitor.

## 2019-05-27 NOTE — Progress Notes (Signed)
Recreation Therapy Notes  INPATIENT RECREATION TR PLAN  Patient Details Name: Casey [name] MRN: [id number] DOB: 01-03-1991 Today's Date: 05/27/2019  Rec Therapy Plan Is patient appropriate for Therapeutic Recreation?: Yes Treatment times per week: at least 3 Estimated Length of Stay: 5-7 days TR Treatment/Interventions: Group participation (Comment)  Discharge Criteria Pt will be discharged from therapy if:: Discharged Treatment plan/goals/alternatives discussed and agreed upon by:: Patient/family  Discharge Summary Short term goals set: Patient will engage in groups without prompting or encouragement from LRT x3 group sessions within 5 recreation therapy group sessions Short term goals met: Not met Progress toward goals comments: Groups attended Which groups?: Coping skills Reason goals not met: Patient spent most of his time in his room Therapeutic equipment acquired: N/A Reason patient discharged from therapy: Discharge from hospital Pt/family agrees with progress & goals achieved: Yes Date patient discharged from therapy: 05/27/19   Nikol Lemar 05/27/2019, 12:00 PM

## 2019-05-27 NOTE — Progress Notes (Signed)
D: Patient is aware of  Discharge this shift   A:Patient denies suicidal /homicidal ideations. Patient received all belongings brought in .: No Storage medications. Writer reviewed Discharge Summary, Suicide Risk Assessment, and Transitional Record. Patient also received Prescriptions   from  MD. A 7 day supply of medications given to patient  Patient  Is  aware  Of follow up appointment .  R: Patient left unit with no questions  Or concerns  With family

## 2019-05-27 NOTE — Progress Notes (Signed)
Patient's blood pressure is elevated he stated l have a cold don't feel well, afebrile 98.1 medicated for elevated blood pressure , medicated for aches and pain and given PRN Vistaril for anxiety. MD on call paged awaiting response.

## 2019-05-27 NOTE — Plan of Care (Signed)
  Problem: Education: Goal: Utilization of techniques to improve thought processes will improve 05/27/2019 1857 by Leodis Liverpool, RN Outcome: Adequate for Discharge 05/27/2019 1222 by Leodis Liverpool, RN Outcome: Progressing Goal: Knowledge of the prescribed therapeutic regimen will improve 05/27/2019 1857 by Leodis Liverpool, RN Outcome: Adequate for Discharge 05/27/2019 1222 by Leodis Liverpool, RN Outcome: Progressing   Problem: Activity: Goal: Interest or engagement in leisure activities will improve 05/27/2019 1857 by Leodis Liverpool, RN Outcome: Adequate for Discharge 05/27/2019 1222 by Leodis Liverpool, RN Outcome: Progressing Goal: Imbalance in normal sleep/wake cycle will improve 05/27/2019 1857 by Leodis Liverpool, RN Outcome: Adequate for Discharge 05/27/2019 1222 by Leodis Liverpool, RN Outcome: Progressing   Problem: Coping: Goal: Coping ability will improve 05/27/2019 1857 by Leodis Liverpool, RN Outcome: Adequate for Discharge 05/27/2019 1222 by Leodis Liverpool, RN Outcome: Progressing Goal: Will verbalize feelings 05/27/2019 1857 by Leodis Liverpool, RN Outcome: Adequate for Discharge 05/27/2019 1222 by Leodis Liverpool, RN Outcome: Progressing   Problem: Health Behavior/Discharge Planning: Goal: Ability to make decisions will improve 05/27/2019 1857 by Leodis Liverpool, RN Outcome: Adequate for Discharge 05/27/2019 1222 by Leodis Liverpool, RN Outcome: Progressing   Problem: Safety: Goal: Ability to disclose and discuss suicidal ideas will improve 05/27/2019 1857 by Leodis Liverpool, RN Outcome: Adequate for Discharge 05/27/2019 1222 by Leodis Liverpool, RN Outcome: Progressing   Problem: Self-Concept: Goal: Will verbalize positive feelings about self 05/27/2019 1857 by Leodis Liverpool, RN Outcome: Adequate for Discharge 05/27/2019 1222 by Leodis Liverpool, RN Outcome: Progressing Goal: Level of anxiety will decrease 05/27/2019 1857 by Leodis Liverpool, RN Outcome:  Adequate for Discharge 05/27/2019 1222 by Leodis Liverpool, RN Outcome: Progressing   Problem: Education: Goal: Knowledge of [place] Education information/materials will improve 05/27/2019 1857 by Leodis Liverpool, RN Outcome: Adequate for Discharge 05/27/2019 1222 by Leodis Liverpool, RN Outcome: Progressing   Problem: Safety: Goal: Periods of time without injury will increase 05/27/2019 1857 by Leodis Liverpool, RN Outcome: Adequate for Discharge 05/27/2019 1222 by Leodis Liverpool, RN Outcome: Progressing

## 2019-05-27 NOTE — Progress Notes (Signed)
D: Patient stated slept good last night .Stated appetite is good and energy level  Is normal. Stated concentration is good . Stated on Depression scale 0 , hopeless 0 and anxiety 0 .( low 0-10 high) Denies suicidal  homicidal ideations  .  No auditory hallucinations  No pain concerns . Appropriate ADL'S. Interacting with peers and staff. Patient aware of Education on [place] and unit programing , able to verbalize understanding . Compliant with  medication received  Attending unit programing  abl to verbal feeling . Working on Radiographer, therapeutic  and decision making    Denies suicidal feelings . Voice of no safety concerns.  A: Encourage patient participation with unit programming . Instruction  Given on  Medication , verbalize understanding.  R: Voice no other concerns. Staff continue to monitor

## 2019-05-27 NOTE — Plan of Care (Signed)
Patient aware of Education on [place] and unit programing , able to verbalize understanding . Compliant with  medication received  Attending unit programing  abl to verbal feeling . Working on Radiographer, therapeutic  and decision making    Denies suicidal feelings . Voice of no safety concerns.   Problem: Safety: Goal: Periods of time without injury will increase Outcome: Progressing   Problem: Education: Goal: Knowledge of [place] [facility] Education information/materials will improve Outcome: Progressing   Problem: Self-Concept: Goal: Will verbalize positive feelings about self Outcome: Progressing Goal: Level of anxiety will decrease Outcome: Progressing   Problem: Safety: Goal: Ability to disclose and discuss suicidal ideas will improve Outcome: Progressing   Problem: Health Behavior/Discharge Planning: Goal: Ability to make decisions will improve Outcome: Progressing   Problem: Coping: Goal: Coping ability will improve Outcome: Progressing Goal: Will verbalize feelings Outcome: Progressing   Problem: Activity: Goal: Interest or engagement in leisure activities will improve Outcome: Progressing Goal: Imbalance in normal sleep/wake cycle will improve Outcome: Progressing   Problem: Education: Goal: Utilization of techniques to improve thought processes will improve Outcome: Progressing Goal: Knowledge of the prescribed therapeutic regimen will improve Outcome: Progressing

## 2019-06-23 ENCOUNTER — Other Ambulatory Visit: Payer: Self-pay

## 2019-06-23 ENCOUNTER — Emergency Department
Admission: EM | Admit: 2019-06-23 | Discharge: 2019-06-23 | Disposition: A | Discharge status: Home / Self Care | Payer: Medicaid Other | Attending: Emergency Medicine | Admitting: Emergency Medicine

## 2019-06-23 ENCOUNTER — Encounter: Payer: Self-pay | Admitting: Emergency Medicine

## 2019-06-23 DIAGNOSIS — Z87891 Personal history of nicotine dependence: Secondary | ICD-10-CM | POA: Insufficient documentation

## 2019-06-23 DIAGNOSIS — F410 Panic disorder [episodic paroxysmal anxiety] without agoraphobia: Secondary | ICD-10-CM | POA: Insufficient documentation

## 2019-06-23 DIAGNOSIS — Z789 Other specified health status: Secondary | ICD-10-CM

## 2019-06-23 DIAGNOSIS — F10120 Alcohol abuse with intoxication, uncomplicated: Secondary | ICD-10-CM | POA: Diagnosis not present

## 2019-06-23 DIAGNOSIS — Z7289 Other problems related to lifestyle: Secondary | ICD-10-CM

## 2019-06-23 NOTE — ED Notes (Addendum)
LT Si Raider, Red, and Urine sent to lab.

## 2019-06-23 NOTE — ED Triage Notes (Signed)
EMS pt to triage with report of having a panic attack after drinking alcohol tonight. Pt hyperventilating during triage.

## 2019-06-23 NOTE — ED Provider Notes (Signed)
[facility] Emergency Department Provider Note  ____________________________________________   First MD Initiated Contact with Patient 06/23/19 0209     (approximate)  I have reviewed the triage vital signs and the nursing notes.   HISTORY  Chief Complaint Alcohol Intoxication and Panic Attack    HPI Casey [name] is a 28 y.o. male with medical history as listed below who also reports that he has occasional panic attacks.  He reports tonight for evaluation after reportedly having a panic attack.  He said that he had 2 beers and then he started to panic.  He was still hyperventilating when he was in triage but now that he is back in exam room he is calm and resting quietly.  He said he feels fine.  He denies suicidal ideation and homicidal ideation.  Nothing in particular made his symptoms get better or worse and they were acute in onset and severe.   However at this time he says he is having no symptoms.  He denies chest pain or shortness of breath although he admits to breathing fast earlier.  He has not been around COVID-19 patients.  He denies nausea, vomiting, abdominal pain, and dysuria.  He denies drug use.        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 Authorizing Provider  amLODipine (NORVASC) 5 MG tablet Take 1 tablet (5 mg total) by mouth daily. 05/27/19   Clapacs, [name], MD  hydrOXYzine (ATARAX/VISTARIL) 25 MG tablet Take 1 tablet (25 mg total) by mouth 3 (three) times daily as  needed for anxiety. 05/27/19   Clapacs, [name], MD  lurasidone (LATUDA) 40 MG TABS tablet Take 1 tablet (40 mg total) by mouth daily with supper. 05/27/19   Clapacs, [name], MD  traZODone (DESYREL) 50 MG tablet Take 1 tablet (50 mg total) by mouth at bedtime as needed for sleep. 05/27/19   Clapacs, [name], MD    Allergies Patient has no known allergies.  Family History  Problem Relation Age of Onset   Diabetes Father    Seizures Father     Social History Social History   Tobacco Use   Smoking status: Former Smoker   Smokeless tobacco: Never Used  Substance Use Topics   Alcohol use: No   Drug use: No    Review of Systems Constitutional: No fever/chills Eyes: No visual changes. ENT: No sore throat. Cardiovascular: Denies chest pain. Respiratory: Shortness of breath associated with panic attack, now resolved. Gastrointestinal: No abdominal pain.  No nausea, no vomiting.  No diarrhea.  No constipation. Genitourinary: Negative for dysuria. Musculoskeletal: Negative for neck pain.  Negative for back pain. Integumentary: Negative for rash. Neurological: Negative for headaches, focal weakness or numbness. Psychiatric:  Panic attack and hyperventilation after drinking 2 beers.  Denies SI/HI.  ____________________________________________   PHYSICAL EXAM:  VITAL SIGNS: ED Triage Vitals  Enc Vitals Group     BP 06/23/19 0134 129/88     Pulse Rate 06/23/19 0134 (!) 121  Resp 06/23/19 0134 (!) 32     Temp 06/23/19 0134 98.5 F (36.9 C)     Temp Source 06/23/19 0134 Oral     SpO2 06/23/19 0134 100 %     Weight 06/23/19 0135 106.6 kg (235 lb)     Height 06/23/19 0135 1.753 m (5\' 9" )     Head Circumference --      Peak Flow --      Pain Score 06/23/19 0135 10     Pain Loc --      Pain Edu? --      Excl. in [place]? --     Constitutional: Alert and oriented.  No acute distress. Eyes: Conjunctivae are normal.  Head: Atraumatic. Nose: No  congestion/rhinnorhea. Mouth/Throat: Mucous membranes are moist. Neck: No stridor.  No meningeal signs.   Cardiovascular: Normal rate, regular rhythm. Good peripheral circulation. Grossly normal heart sounds. Respiratory: Normal respiratory effort.  No retractions. Gastrointestinal: Soft and nontender. No distention.  Musculoskeletal: No lower extremity tenderness nor edema. No gross deformities of extremities. Neurologic:  Normal speech and language. No gross focal neurologic deficits are appreciated.  Skin:  Skin is warm, dry and intact. Psychiatric: Mood and affect are normal. Speech and behavior are normal.  No emergent or warning signs of acute or emergent psychiatric illness.  ____________________________________________   LABS (all labs ordered are listed, but only abnormal results are displayed)  Labs Reviewed - No data to display ____________________________________________  EKG  No indication for EKG ____________________________________________  RADIOLOGY I, Hinda Kehr, personally viewed and evaluated these images (plain radiographs) as part of my medical decision making, as well as reviewing the written report by the radiologist.  ED MD interpretation: No indication for emergent imaging  Official radiology report(s): No results found.  ____________________________________________   PROCEDURES   Procedure(s) performed (including Critical Care):  Procedures   ____________________________________________   INITIAL IMPRESSION / MDM / [place] / ED COURSE  As part of my medical decision making, I reviewed the following data within the [facility] notes reviewed and incorporated, Old chart reviewed, Notes from prior ED visits and [place] Controlled Substance Database   Differential diagnosis includes, but is not limited to, panic attack, anxiety, substance-induced mood disorder, much less likely infection or metabolic  abnormality.  The patient is calm and cooperative and has been in the emergency department about 2 hours and is in no distress.  He denies any symptoms currently and I feel that there is no medical indication for obtaining any lab work.  He had a psychiatry admission about a month ago but at the time he was reporting depression and suicidal ideation and he is denying any of that at this time.  He says he has panic attacks occasionally and feels much better.  He does not meet any criteria for involuntary commitment or inpatient behavioral medicine treatment.  I gave my usual customary return precautions.          ____________________________________________  FINAL CLINICAL IMPRESSION(S) / ED DIAGNOSES  Final diagnoses:  Panic attack  Alcohol use     MEDICATIONS GIVEN DURING THIS VISIT:  Medications - No data to display   ED Discharge Orders    None      *Please note:  Casey [name] was evaluated in Emergency Department on 06/23/2019 for the symptoms described in the history of present illness. He was evaluated in the context of the global COVID-19 pandemic, which necessitated consideration that the patient might be  at risk for infection with the SARS-CoV-2 virus that causes COVID-19. Institutional protocols and algorithms that pertain to the evaluation of patients at risk for COVID-19 are in a state of rapid change based on information released by regulatory bodies including the CDC and federal and state organizations. These policies and algorithms were followed during the patient's care in the ED.  Some ED evaluations and interventions may be delayed as a result of limited staffing during the pandemic.*  Note:  This document was prepared using Dragon voice recognition software and may include unintentional dictation errors.   Loleta Rose, MD 06/23/19 [phone]

## 2019-06-23 NOTE — Discharge Instructions (Addendum)
You have been seen in the Emergency Department (ED) today for a panic attack.  Please follow up with the recommended doctor as instructed above in these documents regarding todays emergent visit and your recent symptoms to discuss further management.  Continue to take your regular medications. If you are not doing so already, consider taking a daily baby aspirin (81 mg), at least until you follow up with your doctor.  Return to the Emergency Department (ED) if you experience any further chest pain/pressure/tightness, difficulty breathing, or sudden sweating, or other symptoms that concern you.

## 2019-06-26 ENCOUNTER — Other Ambulatory Visit: Payer: Self-pay

## 2019-07-19 ENCOUNTER — Emergency Department
Admission: EM | Admit: 2019-07-19 | Discharge: 2019-07-19 | Disposition: A | Discharge status: Home / Self Care | Payer: Medicaid Other | Attending: Emergency Medicine | Admitting: Emergency Medicine

## 2019-07-19 DIAGNOSIS — Z87891 Personal history of nicotine dependence: Secondary | ICD-10-CM | POA: Insufficient documentation

## 2019-07-19 DIAGNOSIS — F10180 Alcohol abuse with alcohol-induced anxiety disorder: Secondary | ICD-10-CM | POA: Diagnosis not present

## 2019-07-19 DIAGNOSIS — F10129 Alcohol abuse with intoxication, unspecified: Secondary | ICD-10-CM | POA: Insufficient documentation

## 2019-07-19 DIAGNOSIS — F419 Anxiety disorder, unspecified: Secondary | ICD-10-CM | POA: Diagnosis present

## 2019-07-19 DIAGNOSIS — Z79899 Other long term (current) drug therapy: Secondary | ICD-10-CM | POA: Insufficient documentation

## 2019-07-19 LAB — CBC
HCT: 44.6 % (ref 39.0–52.0)
Hemoglobin: 15.3 g/dL (ref 13.0–17.0)
MCH: 29.4 pg (ref 26.0–34.0)
MCHC: 34.3 g/dL (ref 30.0–36.0)
MCV: 85.6 fL (ref 80.0–100.0)
Platelets: 392 10*3/uL (ref 150–400)
RBC: 5.21 MIL/uL (ref 4.22–5.81)
RDW: 12 % (ref 11.5–15.5)
WBC: 8.4 10*3/uL (ref 4.0–10.5)
nRBC: 0 % (ref 0.0–0.2)

## 2019-07-19 LAB — COMPREHENSIVE METABOLIC PANEL
ALT: 32 U/L (ref 0–44)
AST: 25 U/L (ref 15–41)
Albumin: 5 g/dL (ref 3.5–5.0)
Alkaline Phosphatase: 76 U/L (ref 38–126)
Anion gap: 13 (ref 5–15)
BUN: 11 mg/dL (ref 6–20)
CO2: 20 mmol/L — ABNORMAL LOW (ref 22–32)
Calcium: 9.7 mg/dL (ref 8.9–10.3)
Chloride: 104 mmol/L (ref 98–111)
Creatinine, Ser: 1.14 mg/dL (ref 0.61–1.24)
GFR calc Af Amer: >60 mL/min (ref >60)
GFR calc non Af Amer: >60 mL/min (ref >60)
Glucose, Bld: 107 mg/dL — ABNORMAL HIGH (ref 70–99)
Potassium: 4 mmol/L (ref 3.5–5.1)
Sodium: 137 mmol/L (ref 135–145)
Total Bilirubin: 0.5 mg/dL (ref 0.3–1.2)
Total Protein: 8.5 g/dL — ABNORMAL HIGH (ref 6.5–8.1)

## 2019-07-19 LAB — URINE DRUG SCREEN, QUALITATIVE (ARMC ONLY)
Amphetamines, Ur Screen: NOT DETECTED
Barbiturates, Ur Screen: NOT DETECTED
Benzodiazepine, Ur Scrn: NOT DETECTED
Cannabinoid 50 Ng, Ur ~~LOC~~: NOT DETECTED
Cocaine Metabolite,Ur ~~LOC~~: POSITIVE — AB
MDMA (Ecstasy)Ur Screen: NOT DETECTED
Methadone Scn, Ur: NOT DETECTED
Opiate, Ur Screen: NOT DETECTED
Phencyclidine (PCP) Ur S: NOT DETECTED
Tricyclic, Ur Screen: NOT DETECTED

## 2019-07-19 LAB — ACETAMINOPHEN LEVEL: Acetaminophen (Tylenol), Serum: <10 ug/mL — ABNORMAL LOW (ref 10–30)

## 2019-07-19 LAB — ETHANOL: Alcohol, Ethyl (B): 161 mg/dL — ABNORMAL HIGH (ref <10)

## 2019-07-19 LAB — SALICYLATE LEVEL: Salicylate Lvl: <7 mg/dL (ref 2.8–30.0)

## 2019-07-19 MED ORDER — TRAZODONE HCL 50 MG PO TABS: 50.0000 mg | ORAL_TABLET | Freq: Every evening | ORAL | Status: DC | PRN

## 2019-07-19 MED ORDER — HYDROXYZINE HCL 25 MG PO TABS: 25.0000 mg | ORAL_TABLET | Freq: Three times a day (TID) | ORAL | Status: DC | PRN

## 2019-07-19 MED ORDER — LORAZEPAM 1 MG PO TABS
1.0000 mg | ORAL_TABLET | Freq: Once | ORAL | Status: AC
  Administered 2019-07-19: 08:00:00 1 mg via ORAL
  Filled 2019-07-19: qty 1

## 2019-07-19 MED ORDER — AMLODIPINE BESYLATE 5 MG PO TABS: 5.0000 mg | ORAL_TABLET | Freq: Every day | ORAL | Status: DC

## 2019-07-19 MED ORDER — LURASIDONE HCL 40 MG PO TABS
40.0000 mg | ORAL_TABLET | Freq: Every day | ORAL | Status: DC
  Filled 2019-07-19: qty 1

## 2019-07-19 NOTE — ED Notes (Signed)
Pt ambulated to bathroom without help

## 2019-07-19 NOTE — Discharge Instructions (Signed)

## 2019-07-19 NOTE — ED Notes (Signed)
BEHAVIORAL HEALTH ROUNDING Patient sleeping: No. Patient alert and oriented: yes Behavior appropriate: Yes.  ; If no, describe:  Nutrition and fluids offered: yes Toileting and hygiene offered: Yes  Sitter present: q15 minute observations  Law enforcement present: Yes [employer]  

## 2019-07-19 NOTE — ED Triage Notes (Signed)
Patient to ED for SI. States he is also having a panic attack. Was drinking alcohol so didn't take the medications he would normally take.

## 2019-07-19 NOTE — ED Notes (Signed)
BEHAVIORAL HEALTH ROUNDING Patient sleeping: Yes.   Patient alert and oriented: eyes closed  Appears asleep Behavior appropriate: Yes.  ; If no, describe:  Nutrition and fluids offered: Yes  Toileting and hygiene offered: sleeping Sitter present: q 15 minute observations  Law enforcement present: yes  [employer] 

## 2019-07-19 NOTE — ED Notes (Addendum)
Dressed out by this Therapist, sports and mel NT  1 pair gray sweat pants 1 pair shoes 1 pair socks 1 pair shorts 1 pair boxers 1 t shirt 1 tank top  All belongings placed in 1 pt belonging bag and taken back to primary RN with pt

## 2019-07-19 NOTE — BH Assessment (Signed)
Writer spoke with patient's aunt Vaughan Basta [phone]) and obtained collateral information. Per the aunt, the patient daughter may be moving and he knew of this for a while. However, last night he left the house and when he returned he was drunk. He was upset and start punching the walls and crying. Aunt believes it was because of the daughter possibly moving away. She was concerned and called 911. Other than him being intoxicated, the aunt have no concerns of him harming himself or anyone else.

## 2019-07-19 NOTE — Consult Note (Signed)
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  He reports having a panic attack and come into the emergency department.  His caregiver reports he went over to a friend's home last night and drank.  When he returned he got upset about his daughter moving out of state.  No suicidal/homicidal ideations, hallucinations, or withdrawal symptoms.  Caregiver has no concerns with his safety.  She would like him to not drink alcohol.  Patient denies having any issues with alcohol.  No threat to self or others at this time, stable for discharge.  Total Time spent with patient: 1 hour  Past Psychiatric History: none  Past Medical History:  Past Medical History:  Diagnosis Date  . H/O eye surgery     Past Surgical History:  Procedure Laterality Date  . HAND SURGERY     Family History:  Family History  Problem Relation Age of Onset  . Diabetes Father   . Seizures Father    Family Psychiatric  History: none Social History:  Social History   Substance and Sexual Activity  Alcohol Use No     Social History   Substance and Sexual Activity  Drug Use No    Social History   Socioeconomic History  . Marital status: Single    Spouse name: Not on file  . Number of children: Not on file  . Years of education: Not on file  . Highest education level: Not on file  Occupational History  . Not on file  Social Needs  . Financial resource strain: Not on file  . Food insecurity    Worry: Not on file    Inability: Not on file  . Transportation needs    Medical: Not on file    Non-medical: Not on file  Tobacco Use  . Smoking status: Former Games developer  . Smokeless tobacco: Never Used   Substance and Sexual Activity  . Alcohol use: No  . Drug use: No  . Sexual activity: Not on file  Lifestyle  . Physical activity    Days per week: Not on file    Minutes per session: Not on file  . Stress: Not on file  Relationships  . Social Musician on phone: Not on file    Gets together: Not on file    Attends religious service: Not on file    Active member of club or organization: Not on file    Attends meetings of clubs or organizations: Not on file    Relationship status: Not on file  Other Topics Concern  . Not on file  Social History Narrative  . Not on file    Has this patient used any form of tobacco in the last 30 days? (Cigarettes, Smokeless Tobacco, Cigars, and/or Pipes) A prescription for an FDA-approved tobacco cessation medication was offered at discharge and the patient refused  Current Medications: Current Facility-Administered Medications  Medication Dose Route Frequency Provider Last Rate Last Dose  . amLODipine (NORVASC) tablet 5 mg  5 mg Oral Daily Nanine Means Y, NP      . hydrOXYzine (ATARAX/VISTARIL) tablet 25 mg  25 mg Oral TID PRN Charm Rings, NP      . lurasidone (  LATUDA) tablet 40 mg  40 mg Oral Q supper Patrecia Pour, NP      . traZODone (DESYREL) tablet 50 mg  50 mg Oral QHS PRN Patrecia Pour, NP       Current Outpatient Medications  Medication Sig Dispense Refill  . amLODipine (NORVASC) 5 MG tablet Take 1 tablet (5 mg total) by mouth daily. 30 tablet 1  . hydrOXYzine (ATARAX/VISTARIL) 25 MG tablet Take 1 tablet (25 mg total) by mouth 3 (three) times daily as needed for anxiety. 60 tablet 1  . lurasidone (LATUDA) 40 MG TABS tablet Take 1 tablet (40 mg total) by mouth daily with supper. 30 tablet 1  . traZODone (DESYREL) 50 MG tablet Take 1 tablet (50 mg total) by mouth at bedtime as needed for sleep. 30 tablet 1   PTA Medications: (Not in a hospital admission)   Musculoskeletal: Strength & Muscle Tone: within normal  limits Gait & Station: normal Patient leans: N/A  Psychiatric Specialty Exam: Physical Exam  Nursing note and vitals reviewed. Constitutional: He is oriented to person, place, and time. He appears well-developed and well-nourished.  HENT:  Head: Normocephalic.  Neck: Normal range of motion.  Respiratory: Effort normal.  Musculoskeletal: Normal range of motion.  Neurological: He is alert and oriented to person, place, and time.  Psychiatric: His speech is normal and behavior is normal. Judgment and thought content normal. His mood appears anxious. Cognition and memory are normal.    Review of Systems  Psychiatric/Behavioral: Positive for substance abuse. The patient is nervous/anxious.   All other systems reviewed and are negative.   Blood pressure 124/75, pulse 97, temperature 98 F (36.7 C), temperature source Oral, resp. rate 18, height 5\' 10"  (1.778 m), weight 106.6 kg, SpO2 98 %.Body mass index is 33.72 kg/m.  General Appearance: Casual  Eye Contact:  Good  Speech:  Normal Rate  Volume:  Normal  Mood:  Anxious  Affect:  Congruent  Thought Process:  Coherent and Descriptions of Associations: Intact  Orientation:  Full (Time, Place, and Person)  Thought Content:  WDL and Logical  Suicidal Thoughts:  No  Homicidal Thoughts:  No  Memory:  Immediate;   Good Recent;   Good Remote;   Good  Judgement:  Fair  Insight:  Fair  Psychomotor Activity:  Normal  Concentration:  Concentration: Good and Attention Span: Good  Recall:  Good  Fund of Knowledge:  Good  Language:  Good  Akathisia:  No  Handed:  Right  AIMS (if indicated):     Assets:  Housing Leisure Time Physical Health Resilience Social Support  ADL's:  Intact  Cognition:  WNL  Sleep:        Demographic Factors:  Male and Adolescent or young adult  Loss Factors: NA  Historical Factors: NA  Risk Reduction Factors:   Sense of responsibility to family, Living with another person, especially a relative,  Positive social support and Positive therapeutic relationship  Continued Clinical Symptoms:  Anxiety  Cognitive Features That Contribute To Risk:  None    Suicide Risk:  Minimal: No identifiable suicidal ideation.  Patients presenting with no risk factors but with morbid ruminations; may be classified as minimal risk based on the severity of the depressive symptoms   Plan Of Care/Follow-up recommendations:  Alcohol induced mood disorder: -Ativan 1 mg once -Recommend outpatient substance abuse treatment  Disposition: Discharge to care home Waylan Boga, NP 07/19/2019, 1:25 PM

## 2019-07-19 NOTE — ED Notes (Signed)
BEHAVIORAL HEALTH ROUNDING Patient sleeping: No. Patient alert and oriented: yes Behavior appropriate: Yes.  ; If no, describe:  Nutrition and fluids offered: yes Toileting and hygiene offered: Yes  Sitter present: q15 minute observations  ENVIRONMENTAL ASSESSMENT Potentially harmful objects out of patient reach: Yes.   Personal belongings secured: Yes.   Patient dressed in hospital provided attire only: Yes.   Plastic bags out of patient reach: Yes.   Patient care equipment (cords, cables, call bells, lines, and drains) shortened, removed, or accounted for: Yes.   Equipment and supplies removed from bottom of stretcher: Yes.   Potentially toxic materials out of patient reach: Yes.   Sharps container removed or out of patient reach: Yes.   

## 2019-07-19 NOTE — ED Provider Notes (Signed)
[facility] Emergency Department Provider Note   ____________________________________________    I have reviewed the triage vital signs and the nursing notes.   HISTORY  Chief Complaint Medical Clearance     HPI Casey [name] is a 28 y.o. male with a history of adjustment disorder, depressive disorder, alcohol abuse who presents with complaints of panic attack and depression.  Patient reports that he was drinking alcohol today and states that he got mad because his wife and child may be leaving "out of the country ".  This is made him upset.  And he has had thoughts of hurting himself.  Denies specific plan.  When he has a panic attack he feels his heart racing  Past Medical History:  Diagnosis Date  . H/O eye surgery     Patient Active Problem List   Diagnosis Date Noted  . 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 Alcohol abuse 05/23/2019  . 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 MDD (major depressive disorder), severe ([place]) 05/22/2019  . 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 HAND SURGERY      Prior to Admission medications   Medication Sig Start Date End Date Taking? Authorizing Provider  amLODipine (NORVASC) 5 MG tablet Take 1 tablet (5 mg total) by mouth daily. 05/27/19   Clapacs, Madie Reno, MD  hydrOXYzine (ATARAX/VISTARIL) 25 MG tablet Take 1 tablet (25 mg total) by mouth 3 (three) times daily as needed for anxiety. 05/27/19   Clapacs, Madie Reno, MD  lurasidone (LATUDA) 40 MG TABS tablet Take 1 tablet (40 mg total) by mouth daily with supper. 05/27/19   Clapacs, Madie Reno, MD  traZODone (DESYREL) 50 MG tablet Take 1 tablet (50 mg total) by mouth at bedtime as needed for sleep. 05/27/19   Clapacs, Madie Reno, MD     Allergies Patient has no known allergies.  Family History  Problem Relation Age of Onset  . Diabetes Father   . Seizures Father     Social  History Social History   Tobacco Use  . Smoking status: Former Research scientist (life sciences)  . Smokeless tobacco: Never Used  Substance Use Topics  . Alcohol use: No  . Drug use: No    Review of Systems  Constitutional: No fever/chills Eyes: No visual changes.  ENT: No sore throat. Cardiovascular: Elevated heart rate Respiratory: Denies shortness of breath. Gastrointestinal: No abdominal pain.   Genitourinary: Negative for dysuria. Musculoskeletal: Negative for back pain. Skin: Negative for rash. Neurological: Negative for headaches   ____________________________________________   PHYSICAL EXAM:  VITAL SIGNS: ED Triage Vitals  Enc Vitals Group     BP 07/19/19 0702 118/90     Pulse Rate 07/19/19 0702 (!) 132     Resp 07/19/19 0702 (!) 22     Temp 07/19/19 0702 98.6 F (37 C)     Temp Source 07/19/19 0702 Oral     SpO2 07/19/19 0702 98 %     Weight 07/19/19 0703 106.6 kg (235 lb)     Height 07/19/19 0703 1.778 m (5\' 10" )     Head Circumference --      Peak Flow --      Pain Score 07/19/19 0703 0     Pain Loc --      Pain Edu? --      Excl. in [place]? --     Constitutional: Alert and oriented.    Head: Atraumatic. Nose: No  congestion/rhinnorhea.  Cardiovascular: Tachycardia, regular rhythm. Grossly normal heart sounds.  Good peripheral circulation. Respiratory: Normal respiratory effort.  No retractions. Lungs CTAB. Gastrointestinal: Soft and nontender. No distention.  No CVA tenderness.  Musculoskeletal: .  Warm and well perfused Neurologic:  Normal speech and language. No gross focal neurologic deficits are appreciated.  Skin:  Skin is warm, dry and intact. No rash noted. Psychiatric: Mood and affect are normal. Speech and behavior are normal.  ____________________________________________   LABS (all labs ordered are listed, but only abnormal results are displayed)  Labs Reviewed  COMPREHENSIVE METABOLIC PANEL - Abnormal; Notable for the following components:      Result  Value   CO2 20 (*)    Glucose, Bld 107 (*)    Total Protein 8.5 (*)    All other components within normal limits  ETHANOL - Abnormal; Notable for the following components:   Alcohol, Ethyl (B) 161 (*)    All other components within normal limits  ACETAMINOPHEN LEVEL - Abnormal; Notable for the following components:   Acetaminophen (Tylenol), Serum <10 (*)    All other components within normal limits  URINE DRUG SCREEN, QUALITATIVE ([facility] ONLY) - Abnormal; Notable for the following components:   Cocaine Metabolite,Ur [place] POSITIVE (*)    All other components within normal limits  SALICYLATE LEVEL  CBC   ____________________________________________  EKG  ED ECG REPORT I, Jene Every, the attending physician, personally viewed and interpreted this ECG.  Date: 07/19/2019  Rhythm: normal sinus rhythm QRS Axis: normal Intervals: normal ST/T Wave abnormalities: normal Narrative Interpretation: no evidence of acute ischemia  ____________________________________________  RADIOLOGY None ____________________________________________   PROCEDURES  Procedure(s) performed: No  Procedures   Critical Care performed: No ____________________________________________   INITIAL IMPRESSION / ASSESSMENT AND PLAN / ED COURSE  Pertinent labs & imaging results that were available during my care of the patient were reviewed by me and considered in my medical decision making (see chart for details).  Patient presents with anxiety and depression.  He has been seen in our emergency department for this before.  Does report alcohol use.  Thoughts of hurting himself.  We will give p.o. Ativan, commit him at this time given the reports of thoughts of hurting himself in the setting of alcohol  Patient's lab work is overall reassuring, elevated ethanol level on cocaine in the urine.  Regardless he is well-appearing and is medically cleared for psychiatric evaluation     ____________________________________________   FINAL CLINICAL IMPRESSION(S) / ED DIAGNOSES  Final diagnoses:  Anxiety  Suicidal ideation        Note:  This document was prepared using Dragon voice recognition software and may include unintentional dictation errors.   Jene Every, MD 07/19/19 1259

## 2019-07-19 NOTE — ED Notes (Signed)
Pt has refused breakfast tray at this time

## 2019-07-20 ENCOUNTER — Emergency Department
Admission: EM | Admit: 2019-07-20 | Discharge: 2019-07-20 | Disposition: A | Discharge status: Home / Self Care | Payer: Medicaid Other | Attending: Emergency Medicine | Admitting: Emergency Medicine

## 2019-07-20 ENCOUNTER — Other Ambulatory Visit: Payer: Self-pay

## 2019-07-20 DIAGNOSIS — Z87891 Personal history of nicotine dependence: Secondary | ICD-10-CM | POA: Diagnosis not present

## 2019-07-20 DIAGNOSIS — Z79899 Other long term (current) drug therapy: Secondary | ICD-10-CM | POA: Diagnosis not present

## 2019-07-20 DIAGNOSIS — F10929 Alcohol use, unspecified with intoxication, unspecified: Secondary | ICD-10-CM | POA: Insufficient documentation

## 2019-07-20 LAB — COMPREHENSIVE METABOLIC PANEL
ALT: 32 U/L (ref 0–44)
AST: 27 U/L (ref 15–41)
Albumin: 4.5 g/dL (ref 3.5–5.0)
Alkaline Phosphatase: 80 U/L (ref 38–126)
Anion gap: 13 (ref 5–15)
BUN: 13 mg/dL (ref 6–20)
CO2: 21 mmol/L — ABNORMAL LOW (ref 22–32)
Calcium: 8.9 mg/dL (ref 8.9–10.3)
Chloride: 104 mmol/L (ref 98–111)
Creatinine, Ser: 1.26 mg/dL — ABNORMAL HIGH (ref 0.61–1.24)
GFR calc Af Amer: >60 mL/min (ref >60)
GFR calc non Af Amer: >60 mL/min (ref >60)
Glucose, Bld: 120 mg/dL — ABNORMAL HIGH (ref 70–99)
Potassium: 3.2 mmol/L — ABNORMAL LOW (ref 3.5–5.1)
Sodium: 138 mmol/L (ref 135–145)
Total Bilirubin: 0.7 mg/dL (ref 0.3–1.2)
Total Protein: 7.7 g/dL (ref 6.5–8.1)

## 2019-07-20 LAB — CBC WITH DIFFERENTIAL/PLATELET
Abs Immature Granulocytes: 0.09 10*3/uL — ABNORMAL HIGH (ref 0.00–0.07)
Basophils Absolute: 0.1 10*3/uL (ref 0.0–0.1)
Basophils Relative: 0 %
Eosinophils Absolute: 0.1 10*3/uL (ref 0.0–0.5)
Eosinophils Relative: 1 %
HCT: 42.1 % (ref 39.0–52.0)
Hemoglobin: 14.3 g/dL (ref 13.0–17.0)
Immature Granulocytes: 1 %
Lymphocytes Relative: 20 %
Lymphs Abs: 2.3 10*3/uL (ref 0.7–4.0)
MCH: 29.5 pg (ref 26.0–34.0)
MCHC: 34 g/dL (ref 30.0–36.0)
MCV: 86.8 fL (ref 80.0–100.0)
Monocytes Absolute: 0.6 10*3/uL (ref 0.1–1.0)
Monocytes Relative: 5 %
Neutro Abs: 8.5 10*3/uL — ABNORMAL HIGH (ref 1.7–7.7)
Neutrophils Relative %: 73 %
Platelets: 350 10*3/uL (ref 150–400)
RBC: 4.85 MIL/uL (ref 4.22–5.81)
RDW: 12.4 % (ref 11.5–15.5)
WBC: 11.6 10*3/uL — ABNORMAL HIGH (ref 4.0–10.5)
nRBC: 0 % (ref 0.0–0.2)

## 2019-07-20 LAB — ETHANOL: Alcohol, Ethyl (B): 217 mg/dL — ABNORMAL HIGH (ref <10)

## 2019-07-20 NOTE — ED Notes (Signed)
Red, Baxter Hire sent to lab.

## 2019-07-20 NOTE — ED Notes (Signed)
Attempted to call primary point of contact Casey [name] (patients aunt). She did not answer but this nurse left a voice mail with instructions to call back at her earliest convenience. Will try again in 30 minutes.

## 2019-07-20 NOTE — ED Notes (Signed)
Report given to Tom, RN.

## 2019-07-20 NOTE — ED Notes (Signed)
Pt verbalized understanding of discharge instructions. Pt in NAD at this time.  

## 2019-07-20 NOTE — ED Notes (Signed)
This RN attempted to call pt's aunt for pick up. Several attempts were made by the previous RN and the pt to contact her. Pt encouraged to stay until she could be reached and/or it stopped raining and the sun came up. Pt refused to stay stating he was ready to go. Pt refusing further treatment including vitals. Pt signed signature pad for discharge.

## 2019-07-20 NOTE — ED Provider Notes (Signed)
[name]  ____________________________________________  Time seen: Approximately 12:48 AM  I have reviewed the triage vital signs and the nursing notes.   HISTORY  Chief Complaint No chief complaint on file.  Level 5 caveat:  Portions of the history and physical were unable to be obtained due to alcohol intoxication   HPI Casey [name] is a 28 y.o. male history of alcohol abuse and adjustment disorder who presents via EMS for agitation in the setting of alcohol intoxication.  Patient was discharged from our hospital 3:30 PM.  According to EMS they were called to the house as patient had been drinking since being discharged.  When they arrived at the house patient became very agitated which prompted them to give him 2 mg of IV Versed.  At this time patient is unable to provide any history.   Patient Casey [name] is now at bedside and reports that she called 911 because patient had been drinking since he left the hospital and started becoming very agitated.  She reports that he has panic attacks when he is drunk which prompted her to call 911.  No SI or HI.  She does feel hopeless with patient's drinking and was requesting involuntary placement for detox.  Explained to the patient that that is not possible and we cannot force him to undergo detox unless this is his wish.  Past Medical History:  Diagnosis Date  . H/O eye surgery     Patient Active Problem List   Diagnosis Date Noted  . 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 Alcohol abuse 05/23/2019  . 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 HAND SURGERY      Prior to Admission medications   Medication Sig Start Date End Date Taking? Authorizing Provider  amLODipine (NORVASC) 5 MG  tablet Take 1 tablet (5 mg total) by mouth daily. 05/27/19   Clapacs, [name], MD  hydrOXYzine (ATARAX/VISTARIL) 25 MG tablet Take 1 tablet (25 mg total) by mouth 3 (three) times daily as needed for anxiety. 05/27/19   Clapacs, [name], MD  lurasidone (LATUDA) 40 MG TABS tablet Take 1 tablet (40 mg total) by mouth daily with supper. 05/27/19   Clapacs, [name], MD  traZODone (DESYREL) 50 MG tablet Take 1 tablet (50 mg total) by mouth at bedtime as needed for sleep. 05/27/19   Clapacs, [name], MD    Allergies Patient has no known allergies.  Family History  Problem Relation Age of Onset  . Diabetes Father   . Seizures Father     Social History Social History   Tobacco Use  . Smoking status: Former Games developermoker  . Smokeless tobacco: Never Used  Substance Use Topics  . Alcohol use: No  . Drug use: No    Review of Systems  Constitutional: Negative for fever. + agitation  ____________________________________________   PHYSICAL EXAM:  VITAL SIGNS: ED Triage Vitals  Enc Vitals Group     BP 07/20/19 0019 125/82     Pulse Rate 07/20/19 0019 97     Resp 07/20/19 0019 15     Temp 07/20/19 0019 98.2 F (36.8 C)     Temp src --      SpO2 07/20/19 0019 95 %     Weight 07/20/19 0021 235 lb 0.2 oz (106.6 kg)  Height 07/20/19 0021 5\' 10"  (1.778 m)     Head Circumference --      Peak Flow --      Pain Score 07/20/19 0020 Asleep     Pain Loc --      Pain Edu? --      Excl. in [place]? --     Constitutional: Sleepy arousable to painful stimuli, maintaining his airway HEENT:      Head: Normocephalic and atraumatic.         Eyes: Conjunctivae are normal. Sclera is non-icteric.       Mouth/Throat: Mucous membranes are moist.       Neck: Supple with no signs of meningismus. Cardiovascular: Regular rate and rhythm. No murmurs, gallops, or rubs. 2+ symmetrical distal pulses are present in all extremities. No JVD. Respiratory: Normal respiratory effort. Lungs are clear to auscultation  bilaterally. No wheezes, crackles, or rhonchi.  Gastrointestinal: Soft, non tender, and non distended with positive bowel sounds.  Musculoskeletal: No edema, cyanosis, or erythema of extremities. Neurologic: GCS 10 Skin: Skin is warm, dry and intact. No rash noted. Psychiatric: Mood and affect are normal. Speech and behavior are normal.  ____________________________________________   LABS (all labs ordered are listed, but only abnormal results are displayed)  Labs Reviewed  CBC WITH DIFFERENTIAL/PLATELET - Abnormal; Notable for the following components:      Result Value   WBC 11.6 (*)    Neutro Abs 8.5 (*)    Abs Immature Granulocytes 0.09 (*)    All other components within normal limits  COMPREHENSIVE METABOLIC PANEL - Abnormal; Notable for the following components:   Potassium 3.2 (*)    CO2 21 (*)    Glucose, Bld 120 (*)    Creatinine, Ser 1.26 (*)    All other components within normal limits  ETHANOL - Abnormal; Notable for the following components:   Alcohol, Ethyl (B) 217 (*)    All other components within normal limits  URINE DRUG SCREEN, QUALITATIVE ([facility] ONLY)   ____________________________________________  EKG  ED ECG REPORT I, 09/19/19, the attending physician, personally viewed and interpreted this ECG.  Sinus tachycardia, rate of 102, normal intervals, normal axis, T wave inversions in inferior leads with no ST elevations or depressions.  Unchanged from prior. ____________________________________________  RADIOLOGY  none  ____________________________________________   PROCEDURES  Procedure(s) performed: None Procedures Critical Care performed:  None ____________________________________________   INITIAL IMPRESSION / ASSESSMENT AND PLAN / ED COURSE  28 y.o. male history of alcohol abuse and adjustment disorder who presents via EMS for agitation in the setting of alcohol intoxication.  Patient arrives a GCS of 10 after receiving Versed per  EMS on top of alcohol.  Patient is maintaining his airway, hemodynamically stable.  Labs with no significant abnormalities.  Alcohol level 217.  Will monitor patient until sober.  Patient's aunt wanted 37 to send patient to detox.  I explained to her that we are unable to do that unless patient consents to it.  I told her I would ask patient in the morning whenever he is sober and if he wishes detox will try to help with placement.  Clinical Course as of Jul 19 506  06-17-1996 Jul 20, 2019  Jul 22, 2019 Patient now clinically sober. Denies SI or HI. Does not wish detox and wishes to go home. Will call his aunt for a sober ride home. Discussed return precautions with patient and close follow up   [CV]    Clinical Course User Index [CV]  Alfred Levins [place], MD      As part of my medical decision making, I reviewed the following data within the [place] History obtained from family, Nursing notes reviewed and incorporated, Labs reviewed , EKG interpreted , Old EKG reviewed, Notes from prior ED visits and [place] Controlled Substance Database   Patient was evaluated in Emergency Department today for the symptoms described in the history of present illness. Patient was evaluated in the context of the global COVID-19 pandemic, which necessitated consideration that the patient might be at risk for infection with the SARS-CoV-2 virus that causes COVID-19. Institutional protocols and algorithms that pertain to the evaluation of patients at risk for COVID-19 are in a state of rapid change based on information released by regulatory bodies including the CDC and federal and state organizations. These policies and algorithms were followed during the patient's care in the ED.   ____________________________________________   FINAL CLINICAL IMPRESSION(S) / ED DIAGNOSES   Final diagnoses:  Alcoholic intoxication with complication ([place])      NEW MEDICATIONS STARTED DURING THIS VISIT:  ED Discharge  Orders    None       Note:  This document was prepared using Dragon voice recognition software and may include unintentional dictation errors.    Rudene Re, MD 07/20/19 [phone]

## 2019-07-27 ENCOUNTER — Emergency Department
Admission: EM | Admit: 2019-07-27 | Discharge: 2019-07-27 | Disposition: A | Discharge status: Home / Self Care | Payer: Medicaid Other | Attending: Emergency Medicine | Admitting: Emergency Medicine

## 2019-07-27 ENCOUNTER — Other Ambulatory Visit: Payer: Self-pay

## 2019-07-27 DIAGNOSIS — F1410 Cocaine abuse, uncomplicated: Secondary | ICD-10-CM | POA: Diagnosis present

## 2019-07-27 DIAGNOSIS — Z046 Encounter for general psychiatric examination, requested by authority: Secondary | ICD-10-CM | POA: Diagnosis present

## 2019-07-27 DIAGNOSIS — Y907 Blood alcohol level of 200-239 mg/100 ml: Secondary | ICD-10-CM | POA: Insufficient documentation

## 2019-07-27 DIAGNOSIS — F10180 Alcohol abuse with alcohol-induced anxiety disorder: Secondary | ICD-10-CM | POA: Diagnosis present

## 2019-07-27 DIAGNOSIS — Z87891 Personal history of nicotine dependence: Secondary | ICD-10-CM | POA: Diagnosis not present

## 2019-07-27 DIAGNOSIS — F1010 Alcohol abuse, uncomplicated: Secondary | ICD-10-CM | POA: Diagnosis present

## 2019-07-27 DIAGNOSIS — F10920 Alcohol use, unspecified with intoxication, uncomplicated: Secondary | ICD-10-CM | POA: Insufficient documentation

## 2019-07-27 DIAGNOSIS — R45851 Suicidal ideations: Secondary | ICD-10-CM | POA: Diagnosis not present

## 2019-07-27 DIAGNOSIS — Z79899 Other long term (current) drug therapy: Secondary | ICD-10-CM | POA: Insufficient documentation

## 2019-07-27 DIAGNOSIS — F1910 Other psychoactive substance abuse, uncomplicated: Secondary | ICD-10-CM | POA: Insufficient documentation

## 2019-07-27 LAB — COMPREHENSIVE METABOLIC PANEL
ALT: 23 U/L (ref 0–44)
AST: 23 U/L (ref 15–41)
Albumin: 4.7 g/dL (ref 3.5–5.0)
Alkaline Phosphatase: 80 U/L (ref 38–126)
Anion gap: 12 (ref 5–15)
BUN: 8 mg/dL (ref 6–20)
CO2: 22 mmol/L (ref 22–32)
Calcium: 9.1 mg/dL (ref 8.9–10.3)
Chloride: 106 mmol/L (ref 98–111)
Creatinine, Ser: 1.35 mg/dL — ABNORMAL HIGH (ref 0.61–1.24)
GFR calc Af Amer: >60 mL/min (ref >60)
GFR calc non Af Amer: >60 mL/min (ref >60)
Glucose, Bld: 105 mg/dL — ABNORMAL HIGH (ref 70–99)
Potassium: 3.2 mmol/L — ABNORMAL LOW (ref 3.5–5.1)
Sodium: 140 mmol/L (ref 135–145)
Total Bilirubin: 0.8 mg/dL (ref 0.3–1.2)
Total Protein: 8.3 g/dL — ABNORMAL HIGH (ref 6.5–8.1)

## 2019-07-27 LAB — CBC WITH DIFFERENTIAL/PLATELET
Abs Immature Granulocytes: 0.02 10*3/uL (ref 0.00–0.07)
Basophils Absolute: 0 10*3/uL (ref 0.0–0.1)
Basophils Relative: 0 %
Eosinophils Absolute: 0.1 10*3/uL (ref 0.0–0.5)
Eosinophils Relative: 1 %
HCT: 42.1 % (ref 39.0–52.0)
Hemoglobin: 14.5 g/dL (ref 13.0–17.0)
Immature Granulocytes: 0 %
Lymphocytes Relative: 37 %
Lymphs Abs: 2.5 10*3/uL (ref 0.7–4.0)
MCH: 29.6 pg (ref 26.0–34.0)
MCHC: 34.4 g/dL (ref 30.0–36.0)
MCV: 85.9 fL (ref 80.0–100.0)
Monocytes Absolute: 0.4 10*3/uL (ref 0.1–1.0)
Monocytes Relative: 6 %
Neutro Abs: 3.8 10*3/uL (ref 1.7–7.7)
Neutrophils Relative %: 56 %
Platelets: 331 10*3/uL (ref 150–400)
RBC: 4.9 MIL/uL (ref 4.22–5.81)
RDW: 12 % (ref 11.5–15.5)
WBC: 6.8 10*3/uL (ref 4.0–10.5)
nRBC: 0 % (ref 0.0–0.2)

## 2019-07-27 LAB — LIPASE, BLOOD: Lipase: 25 U/L (ref 11–51)

## 2019-07-27 LAB — ACETAMINOPHEN LEVEL: Acetaminophen (Tylenol), Serum: <10 ug/mL — ABNORMAL LOW (ref 10–30)

## 2019-07-27 LAB — SALICYLATE LEVEL: Salicylate Lvl: <7 mg/dL (ref 2.8–30.0)

## 2019-07-27 LAB — ETHANOL: Alcohol, Ethyl (B): 220 mg/dL — ABNORMAL HIGH (ref <10)

## 2019-07-27 MED ORDER — DIPHENHYDRAMINE HCL 50 MG/ML IJ SOLN
12.5000 mg | Freq: Once | INTRAMUSCULAR | Status: AC
  Administered 2019-07-27: 04:00:00 12.5 mg via INTRAVENOUS
  Filled 2019-07-27: qty 1

## 2019-07-27 MED ORDER — GABAPENTIN 300 MG PO CAPS: 300.0000 mg | ORAL_CAPSULE | Freq: Three times a day (TID) | ORAL | Status: DC

## 2019-07-27 MED ORDER — GABAPENTIN 300 MG PO CAPS: 300.0000 mg | ORAL_CAPSULE | Freq: Three times a day (TID) | ORAL | 0 refills | Status: DC

## 2019-07-27 MED ORDER — SODIUM CHLORIDE 0.9 % IV BOLUS: 1000.0000 mL | Freq: Once | INTRAVENOUS | Status: DC

## 2019-07-27 MED ORDER — DIPHENHYDRAMINE HCL 50 MG/ML IJ SOLN: 12.5000 mg | Freq: Once | INTRAMUSCULAR | Status: DC

## 2019-07-27 MED ORDER — SODIUM CHLORIDE 0.9 % IV BOLUS
1000.0000 mL | Freq: Once | INTRAVENOUS | Status: AC
  Administered 2019-07-27: 1000 mL via INTRAVENOUS

## 2019-07-27 NOTE — ED Notes (Signed)
Report given to Ashley, RN

## 2019-07-27 NOTE — ED Notes (Signed)
Talked to charge RN, Marya Amsler on whether we should d/c this pt to the lobby of let him stay in the room. Greg instructed to give him some food and drink and let him wait in the room for his ride.

## 2019-07-27 NOTE — ED Notes (Signed)
Pt A&O x4. Gave him his aunt's number so he could call her while he is waiting in the lobby.

## 2019-07-27 NOTE — ED Provider Notes (Signed)
[facility] Emergency Department Provider Note  ____________________________________________  Time seen: Approximately 4:42 AM  I have reviewed the triage vital signs and the nursing notes.   HISTORY  Chief Complaint Drug Overdose    Level 5 Caveat: Portions of the History and Physical including HPI and review of systems are unable to be completely obtained due to patient being a poor historian   HPI Casey [name] is a 28 y.o. male with a history of alcohol abuse who is brought to the ED due to suspected ingestion of cocaine as well as alcohol abuse and reporting suicidal ideation.  Patient denies any current symptoms but is obviously intoxicated.      Past Medical History:  Diagnosis Date  . H/O eye surgery      Patient Active Problem List   Diagnosis Date Noted  . 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 Alcohol abuse 05/23/2019  . 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 HAND SURGERY       Prior to Admission medications   Medication Sig Start Date End Date Taking? Authorizing Provider  amLODipine (NORVASC) 5 MG tablet Take 1 tablet (5 mg total) by mouth daily. 05/27/19   Clapacs, [name], MD  hydrOXYzine (ATARAX/VISTARIL) 25 MG tablet Take 1 tablet (25 mg total) by mouth 3 (three) times daily as needed for anxiety. 05/27/19   Clapacs, [name], MD  lurasidone (LATUDA) 40 MG TABS tablet Take 1 tablet (40 mg total) by mouth daily with supper. 05/27/19   Clapacs, [name], MD  traZODone (DESYREL) 50 MG tablet Take 1 tablet (50 mg total) by mouth at bedtime as needed for sleep. 05/27/19   Clapacs, [name], MD     Allergies Patient has no known allergies.   Family History  Problem Relation Age of Onset  . Diabetes Father   . Seizures Father      Social History Social History   Tobacco Use  . Smoking status: Former Games developermoker  . Smokeless tobacco: Never Used  Substance Use Topics  . Alcohol use: No  . Drug use: No    Review of Systems Level 5 Caveat: Portions of the History and Physical including HPI and review of systems are unable to be completely obtained due to patient being a poor historian   Constitutional:   No known fever.  ENT:   No rhinorrhea. Cardiovascular:   No chest pain or syncope. Respiratory:   No dyspnea or cough. Gastrointestinal:   Negative for abdominal pain, vomiting and diarrhea.  Musculoskeletal:   Negative for focal pain or swelling ____________________________________________   PHYSICAL EXAM:  VITAL SIGNS: ED Triage Vitals  Enc Vitals Group     BP 07/27/19 0333 124/78     Pulse --      Resp 07/27/19 0333 10     Temp 07/27/19 0333 98.5 F (36.9 C)     Temp Source 07/27/19 0333 Oral     SpO2 07/27/19 0333 95 %     Weight 07/27/19 0334 235 lb 0.2 oz (106.6 kg)     Height 07/27/19 0334 5\' 7"  (1.702 m)     Head Circumference --      Peak Flow --      Pain Score 07/27/19 0333 4     Pain Loc --      Pain  Edu? --      Excl. in GC? --     Vital signs reviewed, nursing assessments reviewed.   Constitutional:   Awake and alert. Non-toxic appearance. Eyes:   Conjunctivae are normal. EOMI. PERRL. ENT      Head:   Normocephalic and atraumatic.      Nose:   No congestion/rhinnorhea.       Mouth/Throat:   MMM, no pharyngeal erythema. No peritonsillar mass.       Neck:   No meningismus. Full ROM. Hematological/Lymphatic/Immunilogical:   No cervical lymphadenopathy. Cardiovascular:   RRR. Symmetric bilateral radial and DP pulses.  No murmurs. Cap refill less than 2 seconds. Respiratory:   Normal respiratory effort without tachypnea/retractions. Breath sounds are clear and equal bilaterally. No wheezes/rales/rhonchi. Gastrointestinal:   Soft and nontender. Non distended. There is no CVA  tenderness.  No rebound, rigidity, or guarding.  Musculoskeletal:   Normal range of motion in all extremities. No joint effusions.  No lower extremity tenderness.  No edema. Neurologic:   Slurred speech Motor grossly intact. No acute focal neurologic deficits are appreciated.  Skin:    Skin is warm, dry and intact. No rash noted.  No petechiae, purpura, or bullae.  ____________________________________________    LABS (pertinent positives/negatives) (all labs ordered are listed, but only abnormal results are displayed) Labs Reviewed  ACETAMINOPHEN LEVEL - Abnormal; Notable for the following components:      Result Value   Acetaminophen (Tylenol), Serum <10 (*)    All other components within normal limits  COMPREHENSIVE METABOLIC PANEL - Abnormal; Notable for the following components:   Potassium 3.2 (*)    Glucose, Bld 105 (*)    Creatinine, Ser 1.35 (*)    Total Protein 8.3 (*)    All other components within normal limits  ETHANOL - Abnormal; Notable for the following components:   Alcohol, Ethyl (B) 220 (*)    All other components within normal limits  LIPASE, BLOOD  SALICYLATE LEVEL  CBC WITH DIFFERENTIAL/PLATELET  URINE DRUG SCREEN, QUALITATIVE ([facility] ONLY)   ____________________________________________   EKG  Interpreted by me Sinus tachycardia rate 112.  Normal axis intervals QRS ST segments and T waves.  ____________________________________________    RADIOLOGY  No results found.  ____________________________________________   PROCEDURES Procedures  ____________________________________________    CLINICAL IMPRESSION / ASSESSMENT AND PLAN / ED COURSE  Medications ordered in the ED: Medications  diphenhydrAMINE (BENADRYL) injection 12.5 mg (12.5 mg Intravenous Given 07/27/19 0343)  sodium chloride 0.9 % bolus 1,000 mL (1,000 mLs Intravenous New Bag/Given 07/27/19 0340)    Pertinent labs & imaging results that were available during my care of the  patient were reviewed by me and considered in my medical decision making (see chart for details).   Leeam Cedrone was evaluated in Emergency Department on 07/27/2019 for the symptoms described in the history of present illness. He was evaluated in the context of the global COVID-19 pandemic, which necessitated consideration that the patient might be at risk for infection with the SARS-CoV-2 virus that causes COVID-19. Institutional protocols and algorithms that pertain to the evaluation of patients at risk for COVID-19 are in a state of rapid change based on information released by regulatory bodies including the CDC and federal and state organizations. These policies and algorithms were followed during the patient's care in the ED.   Patient presents with alcohol intoxication possibly coingestion with other illicit substances.  Vital signs unremarkable, patient is maintaining airway and nontoxic.  EKG and initial  labs are unremarkable.  IVC was initiated by Event organiser.  Due to suicidal ideation and suspected polysubstance abuse, I will continue IVC pending psychiatry evaluation.      ____________________________________________   FINAL CLINICAL IMPRESSION(S) / ED DIAGNOSES    Final diagnoses:  Polysubstance abuse ([place])  Suicidal ideation  Alcoholic intoxication without complication [facility])     ED Discharge Orders    None      Portions of this note were generated with dragon dictation software. Dictation errors may occur despite best attempts at proofreading.   Carrie Mew, MD 07/27/19 [id number]

## 2019-07-27 NOTE — ED Notes (Signed)
Report received from Daniel, RN

## 2019-07-27 NOTE — Discharge Instructions (Signed)
[facility] [employer] (Mental Health & Substance Use Services) & [facility]  [place] Address: [address] [place], [place] [address] Hours:  Closed ? Opens 8AM Mon through Fri Phone: [phone]    Substance Use Disorder Substance use disorder occurs when a person's repeated use of drugs or alcohol interferes with his or her ability to be productive. This disorder can cause problems with mental and physical health. It can affect your ability to have healthy relationships, and it can keep you from being able to meet your responsibilities at work, home, or school. It can also lead to addiction, which is a condition in which the person cannot stop using the substance consistently for a period of time. Addiction changes the way the brain works. Because of these changes, addiction is a chronic condition. Substance use disorder can be mild, moderate, or severe. The most commonly abused substances include:  Alcohol.  Tobacco.  Marijuana.  Stimulants, such as cocaine and methamphetamine.  Hallucinogens, such as LSD and PCP.  Opioids, such as some prescription pain medicines and heroin. What are the causes? This condition may develop due to many complex social, psychological, or physical reasons, such as:  Stress.  Abuse.  Peer pressure.  Anxiety or depression. What increases the risk? This condition is more likely to develop in people who:  Use substances to cope with stress.  Have been abused.  Have a mental health disorder, such as depression.  Have a family history of substance use disorder. What are the signs or symptoms? Symptoms of this condition include:  Using the substance for longer periods of time or at a higher dosage than what is normal or intended.  Having a lasting desire to use the substance.  Being unable to slow down or stop the use of  the substance.  Spending an abnormal amount of time getting the substance, using the substance, or recovering from using the substance.  Using the substance in a way that interferes with work, school, social activities, and personal relationships.  Using the substance even after having negative consequences, such as: ? Health problems. ? Legal or financial troubles. ? Job loss. ? Relationship problems.  Needing more and more of the substance to get the same effect (developing tolerance).  Experiencing unpleasant symptoms if you do not use the substance (withdrawal).  Using the substance to avoid withdrawal symptoms. How is this diagnosed? This condition may be diagnosed based on:  A physical exam.  Your history of substance use.  Your symptoms. This includes: ? How substance use affects your life. ? Changes in personality, behaviors, and mood. ? Having at least two symptoms of substance use disorder within a 58-month period. ? Health issues related to substance use, such as liver damage, shortness of breath, fatigue, cough, or heart problems.  Blood or urine tests to screen for alcohol and drugs. How is this treated? This condition may be treated by:  Stopping substance use safely. This may require taking medicines and being closely monitored for several days.  Taking part in group and individual counseling from mental health providers who help people with substance use disorder.  Staying at a live-in (residential) treatment center for several days or weeks.  Attending daily counseling sessions at a treatment center.  Taking medicine as told by your health care provider: ? To ease symptoms and prevent complications during withdrawal. ? To treat other mental health issues, such  as depression or anxiety. ? To block cravings by causing the same effects as the substance. ? To block the effects of the substance or replace good sensations with unpleasant ones.  Participating  in a support group to share your experience with others who are going through the same thing. These groups are an important part of long-term recovery for many people. Recovery can be a long process. Many people who undergo treatment start using the substance again after stopping (relapse). If you relapse, that does not mean that treatment will not work. Follow these instructions at home:   Take over-the-counter and prescription medicines only as told by your health care provider.  Do not use any drugs or alcohol.  Avoid temptations or triggers that you associate with your use of the substance.  Learn and practice techniques for managing stress.  Have a plan for vulnerable moments. Get phone numbers of people who are willing to help and who are committed to your recovery.  Attend support groups on a regular basis. These groups include 12-step programs like Alcoholics Anonymous and Narcotics Anonymous.  Keep all follow-up visits as told by your health care providers. This is important. This includes continuing to work with therapists and support groups. Contact a health care provider if:  You cannot take your medicines as told.  Your symptoms get worse.  You have trouble resisting the urge to use drugs or alcohol. Get help right away if you:  Relapse.  Think that you may have taken too much of a drug. The hotline of the [facility] is 8541256779.  Have signs of an overdose. Symptoms include: ? Chest pain. ? Confusion. ? Sleepiness or difficulty staying awake. ? Slowed breathing. ? Nausea or vomiting. ? A seizure.  Have serious thoughts about hurting yourself or someone else. Drug overdose is an emergency. Do not wait to see if the symptoms will go away. Get medical help right away. Call your local emergency services (911 in the [place] Do not drive yourself to the hospital. If you ever feel like you may hurt yourself or others, or have thoughts about  taking your own life, get help right away. You can go to your nearest emergency department or call:  Your local emergency services (911 in the [place]  A suicide crisis helpline, such as the [facility] at 8084064302. This is open 24 hours a day. Summary  Substance use disorder occurs when a person's repeated use of drugs or alcohol interferes with his or her ability to be productive.  Taking part in group and individual counseling from mental health providers is a common treatment for people with substance use disorder.  Recovery can be a long process. Many people who undergo treatment start using the substance again after stopping (relapse). A relapse does not mean that treatment will not work.  Attend support groups such as Alcoholics Anonymous and Narcotics Anonymous. These groups are an important part of long-term recovery for many people. This information is not intended to replace advice given to you by your health care provider. Make sure you discuss any questions you have with your health care provider. Document Released: 05/17/2005 Document Revised: 01/16/2019 Document Reviewed: 11/06/2017 Elsevier Patient Education  [place].   Substance Use Disorder Substance use disorder occurs when a person's repeated use of drugs or alcohol interferes with his or her ability to be productive. This disorder can cause problems with mental and physical health. It can affect your ability  to have healthy relationships, and it can keep you from being able to meet your responsibilities at work, home, or school. It can also lead to addiction, which is a condition in which the person cannot stop using the substance consistently for a period of time. Addiction changes the way the brain works. Because of these changes, addiction is a chronic condition. Substance use disorder can be mild, moderate, or severe. The most commonly abused substances  include:  Alcohol.  Tobacco.  Marijuana.  Stimulants, such as cocaine and methamphetamine.  Hallucinogens, such as LSD and PCP.  Opioids, such as some prescription pain medicines and heroin. What are the causes? This condition may develop due to many complex social, psychological, or physical reasons, such as:  Stress.  Abuse.  Peer pressure.  Anxiety or depression. What increases the risk? This condition is more likely to develop in people who:  Use substances to cope with stress.  Have been abused.  Have a mental health disorder, such as depression.  Have a family history of substance use disorder. What are the signs or symptoms? Symptoms of this condition include:  Using the substance for longer periods of time or at a higher dosage than what is normal or intended.  Having a lasting desire to use the substance.  Being unable to slow down or stop the use of the substance.  Spending an abnormal amount of time getting the substance, using the substance, or recovering from using the substance.  Using the substance in a way that interferes with work, school, social activities, and personal relationships.  Using the substance even after having negative consequences, such as: ? Health problems. ? Legal or financial troubles. ? Job loss. ? Relationship problems.  Needing more and more of the substance to get the same effect (developing tolerance).  Experiencing unpleasant symptoms if you do not use the substance (withdrawal).  Using the substance to avoid withdrawal symptoms. How is this diagnosed? This condition may be diagnosed based on:  A physical exam.  Your history of substance use.  Your symptoms. This includes: ? How substance use affects your life. ? Changes in personality, behaviors, and mood. ? Having at least two symptoms of substance use disorder within a 72-month period. ? Health issues related to substance use, such as liver damage,  shortness of breath, fatigue, cough, or heart problems.  Blood or urine tests to screen for alcohol and drugs. How is this treated? This condition may be treated by:  Stopping substance use safely. This may require taking medicines and being closely monitored for several days.  Taking part in group and individual counseling from mental health providers who help people with substance use disorder.  Staying at a live-in (residential) treatment center for several days or weeks.  Attending daily counseling sessions at a treatment center.  Taking medicine as told by your health care provider: ? To ease symptoms and prevent complications during withdrawal. ? To treat other mental health issues, such as depression or anxiety. ? To block cravings by causing the same effects as the substance. ? To block the effects of the substance or replace good sensations with unpleasant ones.  Participating in a support group to share your experience with others who are going through the same thing. These groups are an important part of long-term recovery for many people. Recovery can be a long process. Many people who undergo treatment start using the substance again after stopping (relapse). If you relapse, that does not mean that treatment  will not work. Follow these instructions at home:   Take over-the-counter and prescription medicines only as told by your health care provider.  Do not use any drugs or alcohol.  Avoid temptations or triggers that you associate with your use of the substance.  Learn and practice techniques for managing stress.  Have a plan for vulnerable moments. Get phone numbers of people who are willing to help and who are committed to your recovery.  Attend support groups on a regular basis. These groups include 12-step programs like Alcoholics Anonymous and Narcotics Anonymous.  Keep all follow-up visits as told by your health care providers. This is important. This includes  continuing to work with therapists and support groups. Contact a health care provider if:  You cannot take your medicines as told.  Your symptoms get worse.  You have trouble resisting the urge to use drugs or alcohol. Get help right away if you:  Relapse.  Think that you may have taken too much of a drug. The hotline of the [facility] is 440-014-2384(800) (260)659-4065.  Have signs of an overdose. Symptoms include: ? Chest pain. ? Confusion. ? Sleepiness or difficulty staying awake. ? Slowed breathing. ? Nausea or vomiting. ? A seizure.  Have serious thoughts about hurting yourself or someone else. Drug overdose is an emergency. Do not wait to see if the symptoms will go away. Get medical help right away. Call your local emergency services (911 in the [place] Do not drive yourself to the hospital. If you ever feel like you may hurt yourself or others, or have thoughts about taking your own life, get help right away. You can go to your nearest emergency department or call:  Your local emergency services (911 in the [place]  A suicide crisis helpline, such as the National Suicide Prevention Lifeline at (236)093-82611-234-546-1303. This is open 24 hours a day. Summary  Substance use disorder occurs when a person's repeated use of drugs or alcohol interferes with his or her ability to be productive.  Taking part in group and individual counseling from mental health providers is a common treatment for people with substance use disorder.  Recovery can be a long process. Many people who undergo treatment start using the substance again after stopping (relapse). A relapse does not mean that treatment will not work.  Attend support groups such as Alcoholics Anonymous and Narcotics Anonymous. These groups are an important part of long-term recovery for many people. This information is not intended to replace advice given to you by your health care provider. Make sure you discuss any questions you  have with your health care provider. Document Released: 05/17/2005 Document Revised: 01/16/2019 Document Reviewed: 11/06/2017 Elsevier Patient Education  2020 [employer]

## 2019-07-27 NOTE — Consult Note (Addendum)
[facility] Psych ED Discharge  07/27/2019 11:37 AM Casey [name]  MRN:  [id number] Principal Problem: Alcohol abuse with alcohol-induced anxiety disorder [facility]) Discharge Diagnoses: Principal Problem:   Alcohol abuse with alcohol-induced anxiety disorder ([place]) Active Problems:   Cocaine abuse ([place])   Alcohol abuse  Subjective: "I am fine, just cold"  Casey [name] is a 28 y.o. male history of alcohol abuse and adjustment disorder who presented to the ED for agitation with alcohol and suspected cocaine intoxication.  At time of arrival patient is unable to provide detailed history but denied any current psychiatric symptoms.  Patient presented calm and co operative in the emergency department.  Patient denied any suicidal or homicidal ideation at this time, as well as any auditory or visual hallucinations.  Patient verbalized that he wanted to rest for a bit and then would feel well enough to discharge.  Patient provided with a blanket and meal and after resting for a few hours felt stable for discharge.   He is well-known to this ED for similar presentations and is at his baseline and stable for discharge.  Total Time spent with patient: 1 hour  Past Psychiatric History: Patient has frequent hospitalizations for alcohol use and aggressive behavior at this facility.    Past Medical History:  Past Medical History:  Diagnosis Date  . H/O eye surgery     Past Surgical History:  Procedure Laterality Date  . HAND SURGERY     Family History:  Family History  Problem Relation Age of Onset  . Diabetes Father   . Seizures Father    Family Psychiatric  History: none reported  Social History:  Social History   Substance and Sexual Activity  Alcohol Use No     Social History   Substance and Sexual Activity  Drug Use No    Social History   Socioeconomic History  . Marital status: Single    Spouse name: Not on file  . Number of children: Not on file  . Years of education: Not on  file  . Highest education level: Not on file  Occupational History  . Not on file  Social Needs  . Financial resource strain: Not on file  . Food insecurity    Worry: Not on file    Inability: Not on file  . Transportation needs    Medical: Not on file    Non-medical: Not on file  Tobacco Use  . Smoking status: Former Research scientist (life sciences)  . Smokeless tobacco: Never Used  Substance and Sexual Activity  . Alcohol use: No  . Drug use: No  . Sexual activity: Not on file  Lifestyle  . Physical activity    Days per week: Not on file    Minutes per session: Not on file  . Stress: Not on file  Relationships  . Social Herbalist on phone: Not on file    Gets together: Not on file    Attends religious service: Not on file    Active member of club or organization: Not on file    Attends meetings of clubs or organizations: Not on file    Relationship status: Not on file  Other Topics Concern  . Not on file  Social History Narrative  . Not on file    Has this patient used any form of tobacco in the last 30 days? (Cigarettes, Smokeless Tobacco, Cigars, and/or Pipes) Prescription not provided because: patient refused  Current Medications: No current facility-administered medications for this encounter.  Current Outpatient Medications  Medication Sig Dispense Refill  . amLODipine (NORVASC) 5 MG tablet Take 1 tablet (5 mg total) by mouth daily. 30 tablet 1  . hydrOXYzine (ATARAX/VISTARIL) 25 MG tablet Take 1 tablet (25 mg total) by mouth 3 (three) times daily as needed for anxiety. 60 tablet 1  . lurasidone (LATUDA) 40 MG TABS tablet Take 1 tablet (40 mg total) by mouth daily with supper. 30 tablet 1  . traZODone (DESYREL) 50 MG tablet Take 1 tablet (50 mg total) by mouth at bedtime as needed for sleep. 30 tablet 1   PTA Medications: (Not in a hospital admission)   Musculoskeletal: Strength & Muscle Tone: within normal limits Gait & Station: normal Patient leans:  N/A  Psychiatric Specialty Exam: Physical Exam  Nursing note and vitals reviewed. Constitutional: He is oriented to person, place, and time. He appears well-developed.  Neck: Normal range of motion.  Cardiovascular: Normal rate.  Respiratory: Effort normal.  Musculoskeletal: Normal range of motion.  Neurological: He is oriented to person, place, and time.  Skin: Skin is dry.  Psychiatric: His speech is normal and behavior is normal. Thought content normal. His mood appears anxious. He is not agitated, not aggressive and not actively hallucinating. Thought content is not paranoid and not delusional. Cognition and memory are normal. He expresses impulsivity. He expresses no homicidal and no suicidal ideation.    Review of Systems  Constitutional: Negative.   HENT: Negative.   Eyes: Negative.   Respiratory: Negative.   Cardiovascular: Negative.   Gastrointestinal: Negative.   Genitourinary: Negative.   Musculoskeletal: Negative.   Skin: Negative.   Neurological: Negative.   Endo/Heme/Allergies: Negative.   Psychiatric/Behavioral: Positive for substance abuse. The patient is nervous/anxious.     Blood pressure 117/80, pulse 75, temperature 98.5 F (36.9 C), temperature source Oral, resp. rate 19, height 5\' 7"  (1.702 m), weight 106.6 kg, SpO2 95 %.Body mass index is 36.81 kg/m.  General Appearance: Casual  Eye Contact:  Fair  Speech:  Slow  Volume:  Normal  Mood:  Anxious  Affect:  Congruent  Thought Process:  Coherent  Orientation:  Full (Time, Place, and Person)  Thought Content:  Logical  Suicidal Thoughts:  No  Homicidal Thoughts:  No  Memory:  Immediate;   Fair  Judgement:  Fair  Insight:  Lacking  Psychomotor Activity:  Normal  Concentration:  Concentration: Fair  Recall:  [employer]Fair  Fund of Knowledge:  Fair  Language:  Fair  Akathisia:  No  Handed:  Right  AIMS (if indicated):     Assets:  Therapist, sportsCommunication Skills Housing Social Support Transportation  ADL's:  Intact   Cognition:  WNL  Sleep:        Demographic Factors:  Adolescent or young adult  Loss Factors: NA  Historical Factors: Impulsivity and Alcohol and Drug use  Risk Reduction Factors:   Living with another person, especially a relative and Positive social support  Continued Clinical Symptoms:  Alcohol/Substance Abuse/Dependencies More than one psychiatric diagnosis  Cognitive Features That Contribute To Risk:  None    Suicide Risk:  Minimal: No identifiable suicidal ideation.  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  Patient denied any suicidal or homicidal ideation at this time.  He denies any auditory or visual hallucinations at this time.  Patient to discharge home to family.  Patient provided education on drug and alcohol use.   Nanine Means, NP 07/27/2019, 11:37 AM   Case discussed and plan agreed upon as outlined above.

## 2019-07-27 NOTE — ED Notes (Addendum)
Pt woken up and is up to use the restroom.

## 2019-07-27 NOTE — ED Notes (Signed)
Talked to pt about waiting in the room for his aunt to get here. Offered him food and drink. Pt declined offered for food and drink and says he does not want to stay in the room.

## 2019-07-27 NOTE — ED Triage Notes (Signed)
Per EMS, patient was partying today and had what was suspected to be cocaine. Patient has a previous history of alcohol abuse.

## 2019-07-27 NOTE — BH Assessment (Signed)
Writer called and left a HIPPA Compliant message with aunt ([phone]), requesting a return phone call.

## 2019-07-27 NOTE — ED Notes (Addendum)
Attempted to call Dorthula Perfect (aunt) to come and pick him up. She did not answer. Left a voicemail so she could call back

## 2019-07-27 NOTE — ED Provider Notes (Signed)
11:50 AM Patient has been seen and evaluated by psychiatry team and deemed appropriate for discharge. Patient determined not to be a threat to themselves or others. IVC rescinded by psychiatry team. Patient is otherwise medically clear.   Patient is clinically sober -  clear speech, linear thought process, and has tolerated PO in the ED.   Will plan for discharge with outpatient follow up.     Lilia Pro., MD 07/27/19 2206

## 2019-08-14 ENCOUNTER — Emergency Department: Payer: Medicaid Other

## 2019-08-14 ENCOUNTER — Other Ambulatory Visit: Payer: Self-pay

## 2019-08-14 ENCOUNTER — Inpatient Hospital Stay: Admit: 2019-08-14 | Payer: Medicaid Other | Source: Intra-hospital | Admitting: Psychiatry

## 2019-08-14 ENCOUNTER — Emergency Department
Admission: EM | Admit: 2019-08-14 | Discharge: 2019-08-14 | Disposition: A | Discharge status: Home / Self Care | Payer: Medicaid Other | Attending: Emergency Medicine | Admitting: Emergency Medicine

## 2019-08-14 DIAGNOSIS — F1010 Alcohol abuse, uncomplicated: Secondary | ICD-10-CM | POA: Diagnosis present

## 2019-08-14 DIAGNOSIS — Z79899 Other long term (current) drug therapy: Secondary | ICD-10-CM | POA: Insufficient documentation

## 2019-08-14 DIAGNOSIS — F89 Unspecified disorder of psychological development: Secondary | ICD-10-CM | POA: Diagnosis present

## 2019-08-14 DIAGNOSIS — Z87891 Personal history of nicotine dependence: Secondary | ICD-10-CM | POA: Diagnosis not present

## 2019-08-14 DIAGNOSIS — F329 Major depressive disorder, single episode, unspecified: Secondary | ICD-10-CM | POA: Diagnosis present

## 2019-08-14 DIAGNOSIS — Z20828 Contact with and (suspected) exposure to other viral communicable diseases: Secondary | ICD-10-CM | POA: Insufficient documentation

## 2019-08-14 DIAGNOSIS — F4325 Adjustment disorder with mixed disturbance of emotions and conduct: Secondary | ICD-10-CM | POA: Diagnosis present

## 2019-08-14 DIAGNOSIS — F1410 Cocaine abuse, uncomplicated: Secondary | ICD-10-CM | POA: Diagnosis present

## 2019-08-14 DIAGNOSIS — R45851 Suicidal ideations: Secondary | ICD-10-CM | POA: Insufficient documentation

## 2019-08-14 DIAGNOSIS — Y906 Blood alcohol level of 120-199 mg/100 ml: Secondary | ICD-10-CM | POA: Insufficient documentation

## 2019-08-14 DIAGNOSIS — K122 Cellulitis and abscess of mouth: Secondary | ICD-10-CM | POA: Diagnosis present

## 2019-08-14 DIAGNOSIS — F10180 Alcohol abuse with alcohol-induced anxiety disorder: Secondary | ICD-10-CM | POA: Diagnosis present

## 2019-08-14 DIAGNOSIS — F322 Major depressive disorder, single episode, severe without psychotic features: Secondary | ICD-10-CM | POA: Insufficient documentation

## 2019-08-14 DIAGNOSIS — F10920 Alcohol use, unspecified with intoxication, uncomplicated: Secondary | ICD-10-CM | POA: Diagnosis not present

## 2019-08-14 LAB — URINALYSIS, COMPLETE (UACMP) WITH MICROSCOPIC
Bacteria, UA: NONE SEEN
Bilirubin Urine: NEGATIVE
Glucose, UA: NEGATIVE mg/dL
Hgb urine dipstick: NEGATIVE
Ketones, ur: NEGATIVE mg/dL
Leukocytes,Ua: NEGATIVE
Nitrite: NEGATIVE
Protein, ur: NEGATIVE mg/dL
Specific Gravity, Urine: 1.006 (ref 1.005–1.030)
Squamous Epithelial / HPF: NONE SEEN (ref 0–5)
WBC, UA: NONE SEEN WBC/hpf (ref 0–5)
pH: 7 (ref 5.0–8.0)

## 2019-08-14 LAB — URINE DRUG SCREEN, QUALITATIVE (ARMC ONLY)
Amphetamines, Ur Screen: NOT DETECTED
Barbiturates, Ur Screen: NOT DETECTED
Benzodiazepine, Ur Scrn: NOT DETECTED
Cannabinoid 50 Ng, Ur ~~LOC~~: NOT DETECTED
Cocaine Metabolite,Ur ~~LOC~~: NOT DETECTED
MDMA (Ecstasy)Ur Screen: NOT DETECTED
Methadone Scn, Ur: NOT DETECTED
Opiate, Ur Screen: NOT DETECTED
Phencyclidine (PCP) Ur S: NOT DETECTED
Tricyclic, Ur Screen: NOT DETECTED

## 2019-08-14 LAB — COMPREHENSIVE METABOLIC PANEL
ALT: 24 U/L (ref 0–44)
AST: 27 U/L (ref 15–41)
Albumin: 4.7 g/dL (ref 3.5–5.0)
Alkaline Phosphatase: 74 U/L (ref 38–126)
Anion gap: 14 (ref 5–15)
BUN: 9 mg/dL (ref 6–20)
CO2: 23 mmol/L (ref 22–32)
Calcium: 9.4 mg/dL (ref 8.9–10.3)
Chloride: 102 mmol/L (ref 98–111)
Creatinine, Ser: 1.13 mg/dL (ref 0.61–1.24)
GFR calc Af Amer: >60 mL/min (ref >60)
GFR calc non Af Amer: >60 mL/min (ref >60)
Glucose, Bld: 119 mg/dL — ABNORMAL HIGH (ref 70–99)
Potassium: 3.6 mmol/L (ref 3.5–5.1)
Sodium: 139 mmol/L (ref 135–145)
Total Bilirubin: 0.9 mg/dL (ref 0.3–1.2)
Total Protein: 8.1 g/dL (ref 6.5–8.1)

## 2019-08-14 LAB — SARS CORONAVIRUS 2 (TAT 6-24 HRS): SARS Coronavirus 2: NEGATIVE

## 2019-08-14 LAB — CBC
HCT: 43.3 % (ref 39.0–52.0)
Hemoglobin: 14.7 g/dL (ref 13.0–17.0)
MCH: 29.6 pg (ref 26.0–34.0)
MCHC: 33.9 g/dL (ref 30.0–36.0)
MCV: 87.1 fL (ref 80.0–100.0)
Platelets: 348 10*3/uL (ref 150–400)
RBC: 4.97 MIL/uL (ref 4.22–5.81)
RDW: 12.5 % (ref 11.5–15.5)
WBC: 8.4 10*3/uL (ref 4.0–10.5)
nRBC: 0 % (ref 0.0–0.2)

## 2019-08-14 LAB — TROPONIN I (HIGH SENSITIVITY)
Troponin I (High Sensitivity): 6 ng/L (ref <18)
Troponin I (High Sensitivity): 5 ng/L (ref <18)

## 2019-08-14 LAB — ETHANOL: Alcohol, Ethyl (B): 165 mg/dL — ABNORMAL HIGH (ref <10)

## 2019-08-14 MED ORDER — DROPERIDOL 2.5 MG/ML IJ SOLN: 5.0000 mg | Freq: Once | INTRAMUSCULAR | Status: DC

## 2019-08-14 NOTE — ED Notes (Signed)
Report given to Jadeka, RN. 

## 2019-08-14 NOTE — ED Notes (Signed)
Hourly rounding reveals patient in room. No complaints, stable, in no acute distress. Q15 minute rounds and monitoring via Rover and Officer to continue.   

## 2019-08-14 NOTE — ED Notes (Signed)
VOL/PENDING PSYCH CONSULT °

## 2019-08-14 NOTE — ED Triage Notes (Signed)
Pt here chest pain for a few days and anxiety. States has hx of panic attacks, states does feel suicidal.

## 2019-08-14 NOTE — ED Notes (Signed)
Pt no longer being moved to [place]. Pt will be discharged home. Maintained on 15 minute checks and observation by security camera for safety.

## 2019-08-14 NOTE — ED Notes (Signed)
Pt given cab voucher. Approved by Mickel Baas Civil engineer, contracting of ER).

## 2019-08-14 NOTE — ED Notes (Signed)
Pt. Transferred from Triage to room 20 after dressing out and screening for contraband. Report to include Situation, Background, Assessment and Recommendations from [place]. Pt. Oriented to [place] including Q15 minute rounds as well as Engineer, drilling for their protection. Patient is alert and oriented, warm and dry in no acute distress. Patient reported SI because he can't see his daughter. Patient  Denied HI, and AVH. Pt. Encouraged to let me know if needs arise.

## 2019-08-14 NOTE — Consult Note (Signed)
  Patient reassessed this morning.  Patient states that he is no longer suicidal.  He is able to reasonably explain the events of last night as he drank too much and became too drunk.  He is no longer endorsing any suicidal ideation.  He states he feels safe and comfortable going home.  As patient was previously admitted on a voluntary basis as he did not have intent or plan with his suicidal ideation he would be free to leave on his own.  Patient adamantly denying any suicidal ideation currently.  Agreeable to follow-up on an outpatient basis.

## 2019-08-14 NOTE — ED Notes (Signed)
Patient is calm, compliant with portal chest xray. He went back to sleep. No issues.

## 2019-08-14 NOTE — ED Notes (Signed)
Pt discharged with cab voucher.  VS stable. Pt denies SI.  All belongings returned to patient. Discharge instructions reviewed with patient. Pt signed for discharge.

## 2019-08-14 NOTE — Consult Note (Signed)
[facility] Consult   Reason for Consult: Chest Pain, Panic Attack, and Suicidal  Referring Physician: Dr. Dolores FrameSung Patient Identification: Casey StablerJohnathan [name] MRN:  [id number] Principal Diagnosis: <principal problem not specified> Diagnosis:  Active Problems:   Ludwig's angina   Adjustment disorder with mixed disturbance of emotions and conduct   Alcohol abuse   Developmental disability   Alcohol abuse with alcohol-induced anxiety disorder ([facility])   Cocaine abuse ([facility])   Total Time spent with patient: 45 minutes  Subjective: "I was just feeling like I want to hurt myself." Casey StablerJohnathan [name] is a 28 y.o. male patient [facility] ED [facility] EMS voluntarily. The patient voiced he is here due to him having chest pain and anxiety attack for a few days. He expressed having a  history of panic attacks and stated he feels suicidal tonight, but he does not plan.  The patient was seen face-to-face by this provider; chart reviewed and consulted with Dr. Dolores FrameSung on 08/14/2019 due to the patient's care. It was discussed with the EDP that the patient does meet the criteria to be admitted to the psychiatric inpatient unit. The patient is alert and oriented x 3, calm and cooperative, and mood-congruent with affect on evaluation.  The patient does not appear to be responding to internal or external stimuli. Neither is the patient presenting with any delusional thinking. The patient denies auditory or visual hallucinations. The patient admits to suicidal ideation but denies homicidal or self-harm ideations. The patient is not presenting with any psychotic or paranoid behaviors. During an encounter with the patient, he was able to answer questions appropriately.  Plan: The patient is a safety risk to self and does require psychiatric inpatient admission for stabilization and treatment.  HPI: Per Dr. Dolores FrameSung; Casey StablerJohnathan [name] is a 28 y.o. male brought to the ED from home via EMS with a chief complaint of anxiety  and chest pain for a few days.  Patient reports history of panic attacks.  Does voice suicidal ideation without plan.  Denies HI/AH/VH.  Past Psychiatric History: History reviewed. No pertinent past psychiatric history  Risk to Self:  Yes Risk to Others:  No Prior Inpatient Therapy:  Yes Prior Outpatient Therapy:  Yes  Past Medical History:  Past Medical History:  Diagnosis Date  . H/O eye surgery     Past Surgical History:  Procedure Laterality Date  . HAND SURGERY     Family History:  Family History  Problem Relation Age of Onset  . Diabetes Father   . Seizures Father    Family Psychiatric  History: History reviewed. No pertinent family psychiatric history Social History:  Social History   Substance and Sexual Activity  Alcohol Use No     Social History   Substance and Sexual Activity  Drug Use No    Social History   Socioeconomic History  . Marital status: Single    Spouse name: Not on file  . Number of children: Not on file  . Years of education: Not on file  . Highest education level: Not on file  Occupational History  . Not on file  Social Needs  . Financial resource strain: Not on file  . Food insecurity    Worry: Not on file    Inability: Not on file  . Transportation needs    Medical: Not on file    Non-medical: Not on file  Tobacco Use  . Smoking status: Former Games developermoker  . Smokeless tobacco: Never Used  Substance and Sexual Activity  .  Alcohol use: No  . Drug use: No  . Sexual activity: Not on file  Lifestyle  . Physical activity    Days per week: Not on file    Minutes per session: Not on file  . Stress: Not on file  Relationships  . Social Musician on phone: Not on file    Gets together: Not on file    Attends religious service: Not on file    Active member of club or organization: Not on file    Attends meetings of clubs or organizations: Not on file    Relationship status: Not on file  Other Topics Concern  . Not on  file  Social History Narrative  . Not on file   Additional Social History:    Allergies:  No Known Allergies  Labs:  Results for orders placed or performed during the hospital encounter of 08/14/19 (from the past 48 hour(s))  CBC     Status: None   Collection Time: 08/14/19  1:56 AM  Result Value Ref Range   WBC 8.4 4.0 - 10.5 K/uL   RBC 4.97 4.22 - 5.81 MIL/uL   Hemoglobin 14.7 13.0 - 17.0 g/dL   HCT 19.5 09.3 - 26.7 %   MCV 87.1 80.0 - 100.0 fL   MCH 29.6 26.0 - 34.0 pg   MCHC 33.9 30.0 - 36.0 g/dL   RDW 12.4 58.0 - 99.8 %   Platelets 348 150 - 400 K/uL   nRBC 0.0 0.0 - 0.2 %    Comment: Performed at [facility], [address] [place], [place] [address]  Comprehensive metabolic panel     Status: Abnormal   Collection Time: 08/14/19  1:56 AM  Result Value Ref Range   Sodium 139 135 - 145 mmol/L   Potassium 3.6 3.5 - 5.1 mmol/L   Chloride 102 98 - 111 mmol/L   CO2 23 22 - 32 mmol/L   Glucose, Bld 119 (H) 70 - 99 mg/dL   BUN 9 6 - 20 mg/dL   Creatinine, Ser 0.53 0.61 - 1.24 mg/dL   Calcium 9.4 8.9 - 97.6 mg/dL   Total Protein 8.1 6.5 - 8.1 g/dL   Albumin 4.7 3.5 - 5.0 g/dL   AST 27 15 - 41 U/L   ALT 24 0 - 44 U/L   Alkaline Phosphatase 74 38 - 126 U/L   Total Bilirubin 0.9 0.3 - 1.2 mg/dL   GFR calc non Af Amer >60 >60 mL/min   GFR calc Af Amer >60 >60 mL/min   Anion gap 14 5 - 15    Comment: Performed at [facility], [address], [place], [place] [address]  Ethanol     Status: Abnormal   Collection Time: 08/14/19  1:56 AM  Result Value Ref Range   Alcohol, Ethyl (B) 165 (H) <10 mg/dL    Comment: (NOTE) Lowest detectable limit for serum alcohol is 10 mg/dL. For medical purposes only. Performed at [facility], [address] [place], [place] [address]   Urinalysis, Complete w Microscopic     Status: Abnormal   Collection Time: 08/14/19  1:56 AM  Result Value Ref Range   Color, Urine STRAW (A) YELLOW   APPearance CLEAR  (A) CLEAR   Specific Gravity, Urine 1.006 1.005 - 1.030   pH 7.0 5.0 - 8.0   Glucose, UA NEGATIVE NEGATIVE mg/dL   Hgb urine dipstick NEGATIVE NEGATIVE   Bilirubin Urine NEGATIVE NEGATIVE   Ketones,  ur NEGATIVE NEGATIVE mg/dL   Protein, ur NEGATIVE NEGATIVE mg/dL   Nitrite NEGATIVE NEGATIVE   Leukocytes,Ua NEGATIVE NEGATIVE   WBC, UA NONE SEEN 0 - 5 WBC/hpf   Bacteria, UA NONE SEEN NONE SEEN   Squamous Epithelial / LPF NONE SEEN 0 - 5   Mucus PRESENT     Comment: Performed at [facility], [address] [place], [place] [address]  Urine Drug Screen, Qualitative ([facility] only)     Status: None   Collection Time: 08/14/19  1:56 AM  Result Value Ref Range   Tricyclic, Ur Screen NONE DETECTED NONE DETECTED   Amphetamines, Ur Screen NONE DETECTED NONE DETECTED   MDMA (Ecstasy)Ur Screen NONE DETECTED NONE DETECTED   Cocaine Metabolite,Ur [place] NONE DETECTED NONE DETECTED   Opiate, Ur Screen NONE DETECTED NONE DETECTED   Phencyclidine (PCP) Ur S NONE DETECTED NONE DETECTED   Cannabinoid 50 Ng, Ur [place] NONE DETECTED NONE DETECTED   Barbiturates, Ur Screen NONE DETECTED NONE DETECTED   Benzodiazepine, Ur Scrn NONE DETECTED NONE DETECTED   Methadone Scn, Ur NONE DETECTED NONE DETECTED    Comment: (NOTE) Tricyclics + metabolites, urine    Cutoff 1000 ng/mL Amphetamines + metabolites, urine  Cutoff 1000 ng/mL MDMA (Ecstasy), urine              Cutoff 500 ng/mL Cocaine Metabolite, urine          Cutoff 300 ng/mL Opiate + metabolites, urine        Cutoff 300 ng/mL Phencyclidine (PCP), urine         Cutoff 25 ng/mL Cannabinoid, urine                 Cutoff 50 ng/mL Barbiturates + metabolites, urine  Cutoff 200 ng/mL Benzodiazepine, urine              Cutoff 200 ng/mL Methadone, urine                   Cutoff 300 ng/mL The urine drug screen provides only a preliminary, unconfirmed analytical test result and should not be used for non-medical purposes. Clinical consideration and  professional judgment should be applied to any positive drug screen result due to possible interfering substances. A more specific alternate chemical method must be used in order to obtain a confirmed analytical result. Gas chromatography / mass spectrometry (GC/MS) is the preferred confirmat ory method. Performed at [facility], [place], [place] [address]   Troponin I (High Sensitivity)     Status: None   Collection Time: 08/14/19  1:56 AM  Result Value Ref Range   Troponin I (High Sensitivity) 6 <18 ng/L    Comment: (NOTE) Elevated high sensitivity troponin I (hsTnI) values and significant  changes across serial measurements may suggest ACS but many other  chronic and acute conditions are known to elevate hsTnI results.  Refer to the "Links" section for chest pain algorithms and additional  guidance. Performed at [facility], [address], [place], [place] [address]     Current Facility-Administered Medications  Medication Dose Route Frequency Provider Last Rate Last Dose  . droperidol (INAPSINE) 2.5 MG/ML injection 5 mg  5 mg Intramuscular Once Paulette Blanch, MD   Stopped at 08/14/19 0234   Current Outpatient Medications  Medication Sig Dispense Refill  . amLODipine (NORVASC) 5 MG tablet Take 1 tablet (5 mg total) by mouth daily. 30 tablet 1  . gabapentin (NEURONTIN) 300 MG capsule Take  1 capsule (300 mg total) by mouth 3 (three) times daily. 90 capsule 0  . hydrOXYzine (ATARAX/VISTARIL) 25 MG tablet Take 1 tablet (25 mg total) by mouth 3 (three) times daily as needed for anxiety. 60 tablet 1  . lurasidone (LATUDA) 40 MG TABS tablet Take 1 tablet (40 mg total) by mouth daily with supper. 30 tablet 1  . traZODone (DESYREL) 50 MG tablet Take 1 tablet (50 mg total) by mouth at bedtime as needed for sleep. 30 tablet 1    Musculoskeletal: Strength & Muscle Tone: within normal limits Gait & Station: normal Patient leans: N/A  Psychiatric  Specialty Exam: Physical Exam  Nursing note and vitals reviewed. Constitutional: He is oriented to person, place, and time. He appears well-developed and well-nourished.  Neck: Normal range of motion. Neck supple.  Cardiovascular: Normal rate.  Respiratory: Effort normal.  Musculoskeletal: Normal range of motion.  Neurological: He is alert and oriented to person, place, and time.  Skin: Skin is warm and dry.    Review of Systems  Psychiatric/Behavioral: Positive for suicidal ideas. The patient is nervous/anxious.   All other systems reviewed and are negative.   Blood pressure 118/76, pulse 92, temperature 98.1 F (36.7 C), temperature source Oral, resp. rate 16, height  (1.753 m), weight 104.3 kg, SpO2 98 %.Body mass index is 33.97 kg/m.  General Appearance: Casual  Eye Contact:  Fair  Speech:  Clear and Coherent  Volume:  Decreased  Mood:  Anxious and Depressed  Affect:  Congruent  Thought Process:  Coherent  Orientation:  Full (Time, Place, and Person)  Thought Content:  Logical  Suicidal Thoughts:  Yes.  without intent/plan  Homicidal Thoughts:  No  Memory:  Immediate;   Good Recent;   Good Remote;   Good  Judgement:  Impaired  Insight:  Lacking  Psychomotor Activity:  Normal  Concentration:  Concentration: Good  Recall:  Good  Fund of Knowledge:  Fair  Language:  Good  Akathisia:  Negative  Handed:  Right  AIMS (if indicated):     Assets:  Desire for Improvement Physical Health Social Support  ADL's:  Intact  Cognition:  WNL  Sleep:   Good     Treatment Plan Summary: Medication management and Plan The patient meets criteria for psychiatric inpatient admission.  Disposition: Recommend psychiatric Inpatient admission when medically cleared. Supportive therapy provided about ongoing stressors.  Gillermo Murdoch, NP 08/14/2019 3:27 AM

## 2019-08-14 NOTE — BH Assessment (Signed)
Writer spoke with patient to complete updated/reassessment. Patient states he is not SI/HI and AV/H. He's requesting to discharge. He states he was drunk when he came in the ER and didn't mean what he said. Upon arrival to the ER, patient's BAC was 165.  Writer spoke with Psychiatrist, Dr. Sheliah Mends about the patient.

## 2019-08-14 NOTE — ED Notes (Signed)
Patient punching the wall and knocked down the privacy screen door. EDP and charge nurse aware.

## 2019-08-14 NOTE — ED Provider Notes (Signed)
[facility] Emergency Department Provider Note   ____________________________________________   None    (approximate)  I have reviewed the triage vital signs and the nursing notes.   HISTORY  Chief Complaint Chest Pain, Panic Attack, and Suicidal  Level V caveat: limited by intoxication  HPI Casey [name] is a 28 y.o. male brought to the ED from home via EMS with a chief complaint of anxiety and chest pain for a few days.  Patient reports history of panic attacks.  Does voice suicidal ideation without plan.  Denies HI/AH/VH.       Past Medical History:  Diagnosis Date  . H/O eye surgery     Patient Active Problem List   Diagnosis Date Noted  . Current severe episode of major depressive disorder without psychotic features (HCC)   . Cocaine abuse (HCC) 07/27/2019  . 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 Alcohol abuse 05/23/2019  . 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 HAND SURGERY      Prior to Admission medications   Medication Sig Start Date End Date Taking? Authorizing Provider  amLODipine (NORVASC) 5 MG tablet Take 1 tablet (5 mg total) by mouth daily. 05/27/19   Clapacs, [name], MD  gabapentin (NEURONTIN) 300 MG capsule Take 1 capsule (300 mg total) by mouth 3 (three) times daily. 07/27/19   Miguel AschoffMonks, Sarah L., MD  hydrOXYzine (ATARAX/VISTARIL) 25 MG tablet Take 1 tablet (25 mg total) by mouth 3 (three) times daily as needed for anxiety. 05/27/19   Clapacs, [name], MD  lurasidone (LATUDA) 40 MG TABS tablet Take 1 tablet (40 mg total) by mouth daily with supper. 05/27/19   Clapacs, [name], MD  traZODone (DESYREL) 50 MG tablet Take 1 tablet (50 mg total) by mouth at bedtime as needed for sleep. 05/27/19   Clapacs, [name], MD    Allergies Patient  has no known allergies.  Family History  Problem Relation Age of Onset  . Diabetes Father   . Seizures Father     Social History Social History   Tobacco Use  . Smoking status: Former Games developermoker  . Smokeless tobacco: Never Used  Substance Use Topics  . Alcohol use: No  . Drug use: No    Review of Systems  Constitutional: No fever/chills Eyes: No visual changes. ENT: No sore throat. Cardiovascular: Positive for chest pain. Respiratory: Denies shortness of breath. Gastrointestinal: No abdominal pain.  No nausea, no vomiting.  No diarrhea.  No constipation. Genitourinary: Negative for dysuria. Musculoskeletal: Negative for back pain. Skin: Negative for rash. Neurological: Negative for headaches, focal weakness or numbness. Psychiatric:  Positive for anxiety and SI.   ____________________________________________   PHYSICAL EXAM:  VITAL SIGNS: ED Triage Vitals  Enc Vitals Group     BP 08/14/19 0140 118/76     Pulse Rate 08/14/19 0140 92     Resp 08/14/19 0140 16     Temp 08/14/19 0140 98.1 F (36.7 C)     Temp Source 08/14/19 0140 Oral     SpO2 08/14/19 0140 98 %     Weight 08/14/19 0140 240 lb (108.9 kg)     Height 08/14/19 0140 5\' 9"  (1.753 m)     Head Circumference --      Peak Flow --      Pain Score 08/14/19 0151  10     Pain Loc --      Pain Edu? --      Excl. in GC? --     Constitutional: Alert and oriented. Well appearing and in mild acute distress. Eyes: Conjunctivae are normal. PERRL. EOMI. Head: Atraumatic. Nose: No congestion/rhinnorhea. Mouth/Throat: Mucous membranes are moist.  Oropharynx non-erythematous. Neck: No stridor.   Cardiovascular: Normal rate, regular rhythm. Grossly normal heart sounds.  Good peripheral circulation. Respiratory: Normal respiratory effort.  No retractions. Lungs CTAB. Gastrointestinal: Soft and nontender. No distention. No abdominal bruits. No CVA tenderness. Musculoskeletal: No lower extremity tenderness nor edema.   No joint effusions. Neurologic:  Normal speech and language. No gross focal neurologic deficits are appreciated. No gait instability. Skin:  Skin is warm, dry and intact. No rash noted. Psychiatric: Mood and affect are anxious. Speech and behavior are normal.  ____________________________________________   LABS (all labs ordered are listed, but only abnormal results are displayed)  Labs Reviewed  COMPREHENSIVE METABOLIC PANEL - Abnormal; Notable for the following components:      Result Value   Glucose, Bld 119 (*)    All other components within normal limits  ETHANOL - Abnormal; Notable for the following components:   Alcohol, Ethyl (B) 165 (*)    All other components within normal limits  URINALYSIS, COMPLETE (UACMP) WITH MICROSCOPIC - Abnormal; Notable for the following components:   Color, Urine STRAW (*)    APPearance CLEAR (*)    All other components within normal limits  CBC  URINE DRUG SCREEN, QUALITATIVE ([facility] ONLY)  TROPONIN I (HIGH SENSITIVITY)  TROPONIN I (HIGH SENSITIVITY)   ____________________________________________  EKG  ED ECG REPORT I, Ladavia Lindenbaum J, the attending physician, personally viewed and interpreted this ECG.   Date: 08/14/2019  EKG Time: 0146  Rate: 102  Rhythm: sinus tachycardia  Axis: Normal  Intervals:none  ST&T Change: Nonspecific  ____________________________________________  RADIOLOGY  ED MD interpretation: No acute cardiopulmonary process  Official radiology report(s): Dg Chest Port 1 View  Result Date: 08/14/2019 CLINICAL DATA:  Anxiety, chest pain EXAM: PORTABLE CHEST 1 VIEW COMPARISON:  08/14/2019 FINDINGS: Low lung volumes. Heart and mediastinal contours are within normal limits. No focal opacities or effusions. No acute bony abnormality. IMPRESSION: Low volumes.  No active cardiopulmonary disease. Electronically Signed   By: Charlett Nose M.D.   On: 08/14/2019 02:44    ____________________________________________    PROCEDURES  Procedure(s) performed (including Critical Care):  Procedures   ____________________________________________   INITIAL IMPRESSION / ASSESSMENT AND PLAN / ED COURSE  As part of my medical decision making, I reviewed the following data within the electronic MEDICAL RECORD NUMBER Nursing notes reviewed and incorporated, Labs reviewed, EKG interpreted, Old chart reviewed, Radiograph reviewed, A consult was requested and obtained from this/these consultant(s) Psychiatry and Notes from prior ED visits     Casey [name] was evaluated in Emergency Department on 08/14/2019 for the symptoms described in the history of present illness. He was evaluated in the context of the global COVID-19 pandemic, which necessitated consideration that the patient might be at risk for infection with the SARS-CoV-2 virus that causes COVID-19. Institutional protocols and algorithms that pertain to the evaluation of patients at risk for COVID-19 are in a state of rapid change based on information released by regulatory bodies including the CDC and federal and state organizations. These policies and algorithms were followed during the patient's care in the ED.    28 year old male who presents intoxicated with anxiety and vague suicidal  ideation without plan.  Will obtain repeat troponin, consult psychiatry to evaluate.  Punching at the bed in the air.  Will administer IM droperidol for calming.   Clinical Course as of Aug 13 609  Thu Aug 14, 2019  0305 Patient fell asleep before droperidol was given.  Will hold for now.  Psychiatric NP evaluating patient currently.   [JS]  0601 Repeat troponin remains unremarkable. Remains in the ED voluntarily pending psychiatric evaluation and disposition.   [JS]    Clinical Course User Index [JS] Paulette Blanch, MD     ____________________________________________   FINAL CLINICAL IMPRESSION(S) / ED DIAGNOSES  Final diagnoses:  Alcoholic intoxication without  complication [facility])     ED Discharge Orders    None       Note:  This document was prepared using Dragon voice recognition software and may include unintentional dictation errors.   Paulette Blanch, MD 08/14/19 [phone]

## 2019-10-11 ENCOUNTER — Other Ambulatory Visit: Payer: Self-pay

## 2019-10-11 ENCOUNTER — Emergency Department
Admission: EM | Admit: 2019-10-11 | Discharge: 2019-10-11 | Discharge status: Against Medical Advice | Payer: Medicaid Other | Attending: Emergency Medicine | Admitting: Emergency Medicine

## 2019-10-11 ENCOUNTER — Emergency Department: Payer: Medicaid Other

## 2019-10-11 DIAGNOSIS — Z87891 Personal history of nicotine dependence: Secondary | ICD-10-CM | POA: Diagnosis not present

## 2019-10-11 DIAGNOSIS — Z5329 Procedure and treatment not carried out because of patient's decision for other reasons: Secondary | ICD-10-CM | POA: Insufficient documentation

## 2019-10-11 DIAGNOSIS — I10 Essential (primary) hypertension: Secondary | ICD-10-CM | POA: Insufficient documentation

## 2019-10-11 DIAGNOSIS — Z139 Encounter for screening, unspecified: Secondary | ICD-10-CM

## 2019-10-11 DIAGNOSIS — F410 Panic disorder [episodic paroxysmal anxiety] without agoraphobia: Secondary | ICD-10-CM | POA: Diagnosis present

## 2019-10-11 DIAGNOSIS — Z79899 Other long term (current) drug therapy: Secondary | ICD-10-CM | POA: Insufficient documentation

## 2019-10-11 HISTORY — DX: Essential (primary) hypertension: I10

## 2019-10-11 HISTORY — DX: Anxiety disorder, unspecified: F41.9

## 2019-10-11 LAB — CBC
HCT: 43.7 % (ref 39.0–52.0)
Hemoglobin: 14.8 g/dL (ref 13.0–17.0)
MCH: 29.1 pg (ref 26.0–34.0)
MCHC: 33.9 g/dL (ref 30.0–36.0)
MCV: 85.9 fL (ref 80.0–100.0)
Platelets: 346 10*3/uL (ref 150–400)
RBC: 5.09 MIL/uL (ref 4.22–5.81)
RDW: 12.7 % (ref 11.5–15.5)
WBC: 6.3 10*3/uL (ref 4.0–10.5)
nRBC: 0 % (ref 0.0–0.2)

## 2019-10-11 LAB — BASIC METABOLIC PANEL
Anion gap: 19 — ABNORMAL HIGH (ref 5–15)
BUN: 11 mg/dL (ref 6–20)
CO2: 17 mmol/L — ABNORMAL LOW (ref 22–32)
Calcium: 9.3 mg/dL (ref 8.9–10.3)
Chloride: 101 mmol/L (ref 98–111)
Creatinine, Ser: 1.11 mg/dL (ref 0.61–1.24)
GFR calc Af Amer: >60 mL/min (ref >60)
GFR calc non Af Amer: >60 mL/min (ref >60)
Glucose, Bld: 97 mg/dL (ref 70–99)
Potassium: 4 mmol/L (ref 3.5–5.1)
Sodium: 137 mmol/L (ref 135–145)

## 2019-10-11 LAB — ETHANOL: Alcohol, Ethyl (B): 183 mg/dL — ABNORMAL HIGH (ref <10)

## 2019-10-11 LAB — TROPONIN I (HIGH SENSITIVITY): Troponin I (High Sensitivity): 6 ng/L (ref <18)

## 2019-10-11 MED ORDER — AMLODIPINE BESYLATE 5 MG PO TABS: 5.0000 mg | ORAL_TABLET | Freq: Once | ORAL | Status: DC

## 2019-10-11 MED ORDER — HYDROXYZINE HCL 25 MG PO TABS: 25.0000 mg | ORAL_TABLET | Freq: Once | ORAL | Status: DC

## 2019-10-11 MED ORDER — LORAZEPAM 1 MG PO TABS: 1.0000 mg | ORAL_TABLET | Freq: Once | ORAL | Status: DC

## 2019-10-11 NOTE — ED Notes (Signed)
Pt requesting to leave. Quale MD notified.

## 2019-10-11 NOTE — ED Notes (Signed)
ED Provider at bedside. 

## 2019-10-11 NOTE — ED Triage Notes (Signed)
Pt presents via EMS c/o "panic attack". Reports hx HTN and anxiety, incompliant with meds. EMS reports ETOH intake last PM.

## 2019-10-11 NOTE — ED Notes (Signed)
Patient transported to X-ray 

## 2019-10-11 NOTE — ED Provider Notes (Signed)
[facility] Emergency Department Provider Note   ____________________________________________   First MD Initiated Contact with Patient 10/11/19 [phone]     (approximate)  I have reviewed the triage vital signs and the nursing notes.   HISTORY  Chief Complaint Panic Attack    HPI Casey [name] is a 29 y.o. male patient reports that yesterday at about 3 PM he started feeling a sense of stress and a bit of panic.  This went throughout quite a bit of the day.  He did drink 3 beers during the day, but denies that he is drinking every day.  No recent illness.  No fevers or chills.  He was started on medications during his last hospital stay, but reports that he has them but has not been taking them.  He does report a long history of panic attacks.  Today he noticed he was having another "panic attack" and had his uncle called ambulance.  Reports this is happened to him many times in the past.   Past Medical History:  Diagnosis Date  . Anxiety   . H/O eye surgery   . Hypertension     Patient Active Problem List   Diagnosis Date Noted  . Current severe episode of major depressive disorder without psychotic features ([place])   . Cocaine abuse ([place]) 07/27/2019  . Alcohol abuse with alcohol-induced anxiety disorder ([place]) 07/19/2019  . 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 Alcohol abuse 05/23/2019  . 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 HAND SURGERY      Prior to Admission medications   Medication Sig Start Date End Date Taking? Authorizing Provider  amLODipine (NORVASC) 5 MG tablet Take 1 tablet (5 mg total) by mouth daily. 05/27/19   Clapacs, Madie Reno, MD  gabapentin (NEURONTIN) 300 MG capsule Take 1 capsule (300 mg total) by mouth 3 (three) times daily. 07/27/19   Lilia Pro., MD  hydrOXYzine (ATARAX/VISTARIL) 25 MG tablet  Take 1 tablet (25 mg total) by mouth 3 (three) times daily as needed for anxiety. 05/27/19   Clapacs, Madie Reno, MD  lurasidone (LATUDA) 40 MG TABS tablet Take 1 tablet (40 mg total) by mouth daily with supper. 05/27/19   Clapacs, Madie Reno, MD  traZODone (DESYREL) 50 MG tablet Take 1 tablet (50 mg total) by mouth at bedtime as needed for sleep. 05/27/19   Clapacs, Madie Reno, MD    Allergies Patient has no known allergies.  Family History  Problem Relation Age of Onset  . Diabetes Father   . Seizures Father     Social History Social History   Tobacco Use  . Smoking status: Former Research scientist (life sciences)  . Smokeless tobacco: Never Used  Substance Use Topics  . Alcohol use: Yes  . Drug use: No    Review of Systems Constitutional: No fever/chills denies Covid exposure Cardiovascular: A feeling of tightness across his chest which she attributes to feeling of "panic attack Respiratory: Denies shortness of breath.  Denies cough Gastrointestinal: No abdominal pain.   Skin: Negative for rash. Neurological: Negative for headaches, areas of focal weakness or numbness.    ____________________________________________   PHYSICAL EXAM:  VITAL SIGNS: ED Triage Vitals  Enc Vitals Group     BP 10/11/19 0713 (!) 163/112     Pulse Rate 10/11/19 0713 (!) 120     Resp 10/11/19 0713 (!) 24  Temp 10/11/19 0713 97.8 F (36.6 C)     Temp Source 10/11/19 0713 Oral     SpO2 10/11/19 0713 98 %     Weight --      Height --      Head Circumference --      Peak Flow --      Pain Score 10/11/19 0714 0     Pain Loc --      Pain Edu? --      Excl. in GC? --     Constitutional: Alert and oriented. Well appearing and in no acute distress.  I am speaking him he is talking on the phone without any distress.  Appears quite comfortable.  Fully alert. Eyes: Conjunctivae are slightly injected bilateral. Head: Atraumatic. Nose: No congestion/rhinnorhea. Mouth/Throat: Mucous membranes are moist. Neck: No stridor.    Cardiovascular: Minimally tachycardic rate, regular rhythm. Grossly normal heart sounds.  Good peripheral circulation. Respiratory: Normal respiratory effort.  No retractions. Lungs CTAB.  Speaks in clear concise sentences. Gastrointestinal: Soft and nontender. No distention. Musculoskeletal: No lower extremity tenderness nor edema. Neurologic:  Normal speech and language. No gross focal neurologic deficits are appreciated.  Skin:  Skin is warm, dry and intact. No rash noted. Psychiatric: Mood and affect are normal to slightly anxious. Speech and behavior are normal.  No suicidal ideation.  ____________________________________________   LABS (all labs ordered are listed, but only abnormal results are displayed)  Labs Reviewed  BASIC METABOLIC PANEL - Abnormal; Notable for the following components:      Result Value   CO2 17 (*)    Anion gap 19 (*)    All other components within normal limits  ETHANOL - Abnormal; Notable for the following components:   Alcohol, Ethyl (B) 183 (*)    All other components within normal limits  CBC  TROPONIN I (HIGH SENSITIVITY)   ____________________________________________  EKG  ED ECG REPORT I, Sharyn Creamer, the attending physician, personally viewed and interpreted this ECG.  Date: 10/11/2019 EKG Time: 725 Rate: 100 Rhythm: Sinus tachycardia  QRS Axis: normal Intervals: normal ST/T Wave abnormalities: normal Narrative Interpretation: no evidence of acute ischemia  ____________________________________________  RADIOLOGY  DG Chest 2 View  Result Date: 10/11/2019 CLINICAL DATA:  Chest pain. EXAM: CHEST - 2 VIEW COMPARISON:  August 14, 2019. FINDINGS: The heart size and mediastinal contours are within normal limits. Both lungs are clear. No pneumothorax or pleural effusion is noted. The visualized skeletal structures are unremarkable. IMPRESSION: No active cardiopulmonary disease. Electronically Signed   By: Lupita Raider M.D.   On:  10/11/2019 08:18    ____________________________________________   PROCEDURES  Procedure(s) performed: None  Procedures  Critical Care performed: No  ____________________________________________   INITIAL IMPRESSION / ASSESSMENT AND PLAN / ED COURSE  Pertinent labs & imaging results that were available during my care of the patient were reviewed by me and considered in my medical decision making (see chart for details).   Patient presents for what he describes a "panic attack".  He does have a history of psychiatric disease including hospitalization in the past.  He denies any acute psychiatric symptoms such as suicidal ideation.  He does however report that he had a "panic attack".  He appeared anxious when he arrived with EMS, but when on the phone speaking he definitely calms down respirations normalized and his distress seems to go away completely.  The patient was offered treatment and care, EKG was performed which is reassuring.  Not  long after I first spoke to the patient he made a phone call and determined that he would not like to be released.  He does report improvement, appears improved and his vital signs also improved.  Patient declined further evaluation and testing here, including discussion with him that we need to rule out causes for panic attacks such as "heart attack".  However patient who is fully alert appears to have capacity, refuses further medical care at this time citing that he would like to be able to go home and is feeling improved.  He has made arrangements and a ride is here going to be here picking him up.  He does not appear to be under the direct influence of any substances at this time.  He is fully awake and alert.  Discussed and clearly offered further treatment and care here to evaluate for his symptomatology, but patient declined this opportunity.  Patient agreeable with being discharged AGAINST MEDICAL ADVICE       ____________________________________________   FINAL CLINICAL IMPRESSION(S) / ED DIAGNOSES  Final diagnoses:  Encounter for medical screening examination        Note:  This document was prepared using Dragon voice recognition software and may include unintentional dictation errors       Sharyn Creamer, MD 10/11/19 [phone]

## 2019-10-11 NOTE — ED Notes (Signed)
Pt c/o chest pain and back pain. EKG and blood work completed. Pt on phone. Pt updated on visitor policy. Pt states multiple times "im going to pass out" as well as "Im about to leave and walk out". Pt remains in bed at this time. Will assess for escalation.

## 2019-10-18 ENCOUNTER — Emergency Department: Payer: Medicaid Other

## 2019-10-18 ENCOUNTER — Other Ambulatory Visit: Payer: Self-pay

## 2019-10-18 ENCOUNTER — Emergency Department
Admission: EM | Admit: 2019-10-18 | Discharge: 2019-10-18 | Disposition: A | Discharge status: Home / Self Care | Payer: Medicaid Other | Attending: Emergency Medicine | Admitting: Emergency Medicine

## 2019-10-18 ENCOUNTER — Encounter: Payer: Self-pay | Admitting: *Deleted

## 2019-10-18 DIAGNOSIS — I10 Essential (primary) hypertension: Secondary | ICD-10-CM | POA: Insufficient documentation

## 2019-10-18 DIAGNOSIS — R0789 Other chest pain: Secondary | ICD-10-CM | POA: Diagnosis present

## 2019-10-18 DIAGNOSIS — R079 Chest pain, unspecified: Secondary | ICD-10-CM

## 2019-10-18 DIAGNOSIS — F10920 Alcohol use, unspecified with intoxication, uncomplicated: Secondary | ICD-10-CM

## 2019-10-18 DIAGNOSIS — Z008 Encounter for other general examination: Secondary | ICD-10-CM | POA: Insufficient documentation

## 2019-10-18 DIAGNOSIS — F17210 Nicotine dependence, cigarettes, uncomplicated: Secondary | ICD-10-CM | POA: Insufficient documentation

## 2019-10-18 DIAGNOSIS — Y906 Blood alcohol level of 120-199 mg/100 ml: Secondary | ICD-10-CM | POA: Insufficient documentation

## 2019-10-18 DIAGNOSIS — F10180 Alcohol abuse with alcohol-induced anxiety disorder: Secondary | ICD-10-CM | POA: Diagnosis present

## 2019-10-18 DIAGNOSIS — Z79899 Other long term (current) drug therapy: Secondary | ICD-10-CM | POA: Diagnosis not present

## 2019-10-18 DIAGNOSIS — F410 Panic disorder [episodic paroxysmal anxiety] without agoraphobia: Secondary | ICD-10-CM

## 2019-10-18 LAB — BASIC METABOLIC PANEL
Anion gap: 11 (ref 5–15)
BUN: 9 mg/dL (ref 6–20)
CO2: 23 mmol/L (ref 22–32)
Calcium: 9.4 mg/dL (ref 8.9–10.3)
Chloride: 103 mmol/L (ref 98–111)
Creatinine, Ser: 1.1 mg/dL (ref 0.61–1.24)
GFR calc Af Amer: >60 mL/min (ref >60)
GFR calc non Af Amer: >60 mL/min (ref >60)
Glucose, Bld: 104 mg/dL — ABNORMAL HIGH (ref 70–99)
Potassium: 3.7 mmol/L (ref 3.5–5.1)
Sodium: 137 mmol/L (ref 135–145)

## 2019-10-18 LAB — URINE DRUG SCREEN, QUALITATIVE (ARMC ONLY)
Amphetamines, Ur Screen: NOT DETECTED
Barbiturates, Ur Screen: NOT DETECTED
Benzodiazepine, Ur Scrn: NOT DETECTED
Cannabinoid 50 Ng, Ur ~~LOC~~: NOT DETECTED
Cocaine Metabolite,Ur ~~LOC~~: NOT DETECTED
MDMA (Ecstasy)Ur Screen: NOT DETECTED
Methadone Scn, Ur: NOT DETECTED
Opiate, Ur Screen: NOT DETECTED
Phencyclidine (PCP) Ur S: NOT DETECTED
Tricyclic, Ur Screen: NOT DETECTED

## 2019-10-18 LAB — CBC
HCT: 43.5 % (ref 39.0–52.0)
Hemoglobin: 14.8 g/dL (ref 13.0–17.0)
MCH: 29.2 pg (ref 26.0–34.0)
MCHC: 34 g/dL (ref 30.0–36.0)
MCV: 85.8 fL (ref 80.0–100.0)
Platelets: 347 10*3/uL (ref 150–400)
RBC: 5.07 MIL/uL (ref 4.22–5.81)
RDW: 12.9 % (ref 11.5–15.5)
WBC: 8.7 10*3/uL (ref 4.0–10.5)
nRBC: 0 % (ref 0.0–0.2)

## 2019-10-18 LAB — ETHANOL: Alcohol, Ethyl (B): 195 mg/dL — ABNORMAL HIGH (ref <10)

## 2019-10-18 LAB — SALICYLATE LEVEL: Salicylate Lvl: <7 mg/dL — ABNORMAL LOW (ref 7.0–30.0)

## 2019-10-18 LAB — TROPONIN I (HIGH SENSITIVITY): Troponin I (High Sensitivity): 4 ng/L (ref <18)

## 2019-10-18 LAB — ACETAMINOPHEN LEVEL: Acetaminophen (Tylenol), Serum: <10 ug/mL — ABNORMAL LOW (ref 10–30)

## 2019-10-18 MED ORDER — SODIUM CHLORIDE 0.9% FLUSH: 3.0000 mL | Freq: Once | INTRAVENOUS | Status: DC

## 2019-10-18 NOTE — ED Notes (Signed)
Pt given his belongings bag 3/3. Pt changed back into clothes and calling his family on his cell phone for a ride.

## 2019-10-18 NOTE — ED Triage Notes (Signed)
Pt c/o chest pain since 1700 yesterday. Pt is hyperventilating prior to and during triage intermittently. Pt instructed to breathe through a straw which helped to decrease the hyperventilation. Pt admits to smoking "a blunt" prior to arrival. Pt admits to drinking ETOH prior to arrival. Pt brought to the ED by [employer]. [employer] called for noise violation and found pt w/ family and friends hyperventilating. Pt states he took no meds for his chest pain.

## 2019-10-18 NOTE — ED Notes (Signed)
Message left with aunt about picking patient up.

## 2019-10-18 NOTE — Consult Note (Addendum)
[facility] Psych ED Discharge  10/18/2019 10:02 AM Casey [name]  MRN:  [id number] Principal Problem: Alcohol abuse with alcohol-induced anxiety disorder [facility]) Discharge Diagnoses: Principal Problem:   Alcohol abuse with alcohol-induced anxiety disorder (HCC)  Subjective: "I'm good."  Patient seen and evaluated in person by this provider.  He is calm and cooperative on assessment with no suicidal/homicidal ideations, hallucinations, or withdrawal symptoms.  Evidently he was drinking last night and was being charged with no noise issues in the community.  Then he started have a panic attack and was brought to the emergency department.  He is clear and coherent at this time and psychiatrically stable.  Total Time spent with patient: 30 minutes  Past Psychiatric History: anxiety, substance abuse  Past Medical History:  Past Medical History:  Diagnosis Date  . Anxiety   . H/O eye surgery   . Hypertension     Past Surgical History:  Procedure Laterality Date  . HAND SURGERY     Family History:  Family History  Problem Relation Age of Onset  . Diabetes Father   . Seizures Father    Family Psychiatric  History: none Social History:  Social History   Substance and Sexual Activity  Alcohol Use Yes     Social History   Substance and Sexual Activity  Drug Use Yes  . Types: Marijuana   Comment: today    Social History   Socioeconomic History  . Marital status: Single    Spouse name: Not on file  . Number of children: Not on file  . Years of education: Not on file  . Highest education level: Not on file  Occupational History  . Not on file  Tobacco Use  . Smoking status: Current Every Day Smoker    Types: Cigarettes  . Smokeless tobacco: Never Used  Substance and Sexual Activity  . Alcohol use: Yes  . Drug use: Yes    Types: Marijuana    Comment: today  . Sexual activity: Not on file  Other Topics Concern  . Not on file  Social History Narrative  . Not on file    Social Determinants of Health   Financial Resource Strain:   . Difficulty of Paying Living Expenses: Not on file  Food Insecurity:   . Worried About Programme researcher, broadcasting/film/video in the Last Year: Not on file  . Ran Out of Food in the Last Year: Not on file  Transportation Needs:   . Lack of Transportation (Medical): Not on file  . Lack of Transportation (Non-Medical): Not on file  Physical Activity:   . Days of Exercise per Week: Not on file  . Minutes of Exercise per Session: Not on file  Stress:   . Feeling of Stress : Not on file  Social Connections:   . Frequency of Communication with Friends and Family: Not on file  . Frequency of Social Gatherings with Friends and Family: Not on file  . Attends Religious Services: Not on file  . Active Member of Clubs or Organizations: Not on file  . Attends Banker Meetings: Not on file  . Marital Status: Not on file    Has this patient used any form of tobacco in the last 30 days? (Cigarettes, Smokeless Tobacco, Cigars, and/or Pipes) A prescription for an FDA-approved tobacco cessation medication was offered at discharge and the patient refused  Current Medications: No current facility-administered medications for this encounter.   Current Outpatient Medications  Medication Sig Dispense Refill  .  amLODipine (NORVASC) 5 MG tablet Take 1 tablet (5 mg total) by mouth daily. 30 tablet 1  . gabapentin (NEURONTIN) 300 MG capsule Take 1 capsule (300 mg total) by mouth 3 (three) times daily. 90 capsule 0  . hydrOXYzine (ATARAX/VISTARIL) 25 MG tablet Take 1 tablet (25 mg total) by mouth 3 (three) times daily as needed for anxiety. 60 tablet 1  . lurasidone (LATUDA) 40 MG TABS tablet Take 1 tablet (40 mg total) by mouth daily with supper. 30 tablet 1  . traZODone (DESYREL) 50 MG tablet Take 1 tablet (50 mg total) by mouth at bedtime as needed for sleep. 30 tablet 1   PTA Medications: (Not in a hospital  admission)   Musculoskeletal: Strength & Muscle Tone: within normal limits Gait & Station: normal Patient leans: N/A  Psychiatric Specialty Exam: Physical Exam Vitals and nursing note reviewed.  Constitutional:      Appearance: He is well-developed.  HENT:     Head: Normocephalic.  Pulmonary:     Effort: Pulmonary effort is normal.  Musculoskeletal:        General: Normal range of motion.     Cervical back: Normal range of motion.  Neurological:     General: No focal deficit present.     Mental Status: He is alert and oriented to person, place, and time.  Psychiatric:        Attention and Perception: Attention and perception normal.        Mood and Affect: Mood is anxious.        Speech: Speech normal.        Behavior: Behavior normal.        Thought Content: Thought content normal.        Cognition and Memory: Cognition and memory normal.        Judgment: Judgment normal.     Review of Systems  Psychiatric/Behavioral: The patient is nervous/anxious.   All other systems reviewed and are negative.   Blood pressure (!) 139/103, pulse 91, temperature 98.1 F (36.7 C), temperature source Oral, resp. rate 17, height 5\' 10"  (1.778 m), weight 108.9 kg, SpO2 97 %.Body mass index is 34.44 kg/m.  General Appearance: Casual  Eye Contact:  Good  Speech:  Normal Rate  Volume:  Normal  Mood:  Anxious  Affect:  Congruent  Thought Process:  Coherent and Descriptions of Associations: Intact  Orientation:  Full (Time, Place, and Person)  Thought Content:  WDL and Logical  Suicidal Thoughts:  No  Homicidal Thoughts:  No  Memory:  Immediate;   Good Recent;   Good Remote;   Good  Judgement:  Fair  Insight:  Fair  Psychomotor Activity:  Normal  Concentration:  Concentration: Good and Attention Span: Good  Recall:  Good  Fund of Knowledge:  Fair  Language:  Good  Akathisia:  No  Handed:  Right  AIMS (if indicated):     Assets:  Housing Leisure Time Physical  Health Resilience Social Support  ADL's:  Intact  Cognition:  WNL  Sleep:        Demographic Factors:  Male and Adolescent or young adult  Loss Factors: NA  Historical Factors: NA  Risk Reduction Factors:   Sense of responsibility to family, Living with another person, especially a relative and Positive social support  Continued Clinical Symptoms:  Anxiety, mild  Cognitive Features That Contribute To Risk:  None    Suicide Risk:  Minimal: No identifiable suicidal ideation.  Patients presenting with no risk  factors but with morbid ruminations; may be classified as minimal risk based on the severity of the depressive symptoms   Plan Of Care/Follow-up recommendations:  Major depressive disorder, stable: -Continue Latuda 40 mg daily -Follow-up with [facility] with his peer support team  Anxiety: -Continue hydroxyzine 25 mg 3 times daily as needed -Continue gabapentin 300 mg 3 times daily  Insomnia: -Continue trazodone 50 mg at bedtime as needed  Activity:  as tolerated Diet:  heart healthy diet  Disposition: discharge home Nanine Means, NP 10/18/2019, 10:02 AM   Case discussed and plan agreed upon as outlined above.

## 2019-10-18 NOTE — ED Notes (Signed)
Psych at bedside.

## 2019-10-18 NOTE — ED Provider Notes (Signed)
[facility] Emergency Department Provider Note  ____________________________________________   First MD Initiated Contact with Patient 10/18/19 [phone]     (approximate)  I have reviewed the triage vital signs and the nursing notes.   HISTORY  Chief Complaint Chest Pain    HPI Casey [name] is a 29 y.o. male with below list of previous medical conditions presents to the emergency department secondary to "I had a panic attack" .  Patient states that starting around 5:00 PM yesterday he started having feelings of anxiety and panic attack with associated chest tightness and "breathing really fast".  Patient states that he passed out couple times".  Patient denies any symptoms at present.  Patient states that he has been prescribed anxiety medications in the past but that he does not take it because "I do not like the way it makes me feel".  Patient denies any suicidal or homicidal ideation.  Patient denies any illicit drug use tonight however does admit to EtOH ingestion.  Triage note states that the patient admitted to smoking marijuana before arrival however patient denies.       Past Medical History:  Diagnosis Date  . Anxiety   . H/O eye surgery   . Hypertension     Patient Active Problem List   Diagnosis Date Noted  . Current severe episode of major depressive disorder without psychotic features ([place])   . Cocaine abuse ([place]) 07/27/2019  . Alcohol abuse with alcohol-induced anxiety disorder ([place]) 07/19/2019  . 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 Alcohol abuse 05/23/2019  . 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 HAND SURGERY      Prior to Admission medications   Medication Sig Start Date End Date Taking? Authorizing Provider  amLODipine (NORVASC) 5 MG tablet Take 1 tablet (5 mg total) by mouth daily. 05/27/19   Clapacs,  Madie Reno, MD  gabapentin (NEURONTIN) 300 MG capsule Take 1 capsule (300 mg total) by mouth 3 (three) times daily. 07/27/19   Lilia Pro., MD  hydrOXYzine (ATARAX/VISTARIL) 25 MG tablet Take 1 tablet (25 mg total) by mouth 3 (three) times daily as needed for anxiety. 05/27/19   Clapacs, Madie Reno, MD  lurasidone (LATUDA) 40 MG TABS tablet Take 1 tablet (40 mg total) by mouth daily with supper. 05/27/19   Clapacs, Madie Reno, MD  traZODone (DESYREL) 50 MG tablet Take 1 tablet (50 mg total) by mouth at bedtime as needed for sleep. 05/27/19   Clapacs, Madie Reno, MD    Allergies Patient has no known allergies.  Family History  Problem Relation Age of Onset  . Diabetes Father   . Seizures Father     Social History Social History   Tobacco Use  . Smoking status: Current Every Day Smoker    Types: Cigarettes  . Smokeless tobacco: Never Used  Substance Use Topics  . Alcohol use: Yes  . Drug use: Yes    Types: Marijuana    Comment: today    Review of Systems Constitutional: No fever/chills Eyes: No visual changes. ENT: No sore throat. Cardiovascular: Denies chest pain. Respiratory: Denies shortness of breath. Gastrointestinal: No abdominal pain.  No nausea, no vomiting.  No diarrhea.  No constipation. Genitourinary: Negative for dysuria. Musculoskeletal: Negative for neck pain.  Negative for back pain. Integumentary: Negative for rash. Neurological: Negative for headaches, focal weakness or numbness. Psychiatric:  Positive for "panic  attack"   ____________________________________________   PHYSICAL EXAM:  VITAL SIGNS: ED Triage Vitals  Enc Vitals Group     BP 10/18/19 0424 (!) 109/93     Pulse Rate 10/18/19 0424 (!) 104     Resp 10/18/19 0424 16     Temp 10/18/19 0424 97.9 F (36.6 C)     Temp Source 10/18/19 0424 Oral     SpO2 10/18/19 0424 97 %     Weight 10/18/19 0426 108.9 kg (240 lb)     Height 10/18/19 0426 1.778 m (5\' 10" )     Head Circumference --      Peak Flow --        Pain Score 10/18/19 0426 10     Pain Loc --      Pain Edu? --      Excl. in GC? --     Constitutional: Alert and oriented.  Eyes: Conjunctivae are normal.  Head: Atraumatic. Mouth/Throat: Patient is wearing a mask. Neck: No stridor.  No meningeal signs.   Cardiovascular: Normal rate, regular rhythm. Good peripheral circulation. Grossly normal heart sounds. Respiratory: Normal respiratory effort.  No retractions. Gastrointestinal: Soft and nontender. No distention.  Musculoskeletal: No lower extremity tenderness nor edema. No gross deformities of extremities. Neurologic:  Normal speech and language. No gross focal neurologic deficits are appreciated.  Skin:  Skin is warm, dry and intact. Psychiatric: Mood and affect are normal. Speech and behavior are normal.  ____________________________________________   LABS (all labs ordered are listed, but only abnormal results are displayed)  Labs Reviewed  CBC  BASIC METABOLIC PANEL  ETHANOL  SALICYLATE LEVEL  ACETAMINOPHEN LEVEL  URINE DRUG SCREEN, QUALITATIVE ([facility] ONLY)  TROPONIN I (HIGH SENSITIVITY)   ____________________________________________  EKG  ED ECG REPORT I, [place] N Zamoria Boss, the attending physician, personally viewed and interpreted this ECG.   Date: 10/18/2019  EKG Time: 4:29 AM  Rate: 110  Rhythm: Sinus tachycardia  Axis: Normal  Intervals: Normal  ST&T Change: None    Procedures   ____________________________________________   INITIAL IMPRESSION / MDM / ASSESSMENT AND PLAN / ED COURSE  As part of my medical decision making, I reviewed the following data within the electronic MEDICAL RECORD NUMBER    29 year old male presents with above-stated history and physical exam secondary to "panic attack".  Patient with no complaints at present stating that he feels fine.  Patient also admitted to EtOH ingestion with laboratory data revealing an EtOH level of 195.  Awaiting  sobriety  ____________________________________________  FINAL CLINICAL IMPRESSION(S) / ED DIAGNOSES  Final diagnoses:  Panic attack  Alcoholic intoxication without complication (HCC)     MEDICATIONS GIVEN DURING THIS VISIT:  Medications  sodium chloride flush (NS) 0.9 % injection 3 mL (has no administration in time range)     ED Discharge Orders    None      *Please note:  Casey [name] was evaluated in Emergency Department on 10/18/2019 for the symptoms described in the history of present illness. He was evaluated in the context of the global COVID-19 pandemic, which necessitated consideration that the patient might be at risk for infection with the SARS-CoV-2 virus that causes COVID-19. Institutional protocols and algorithms that pertain to the evaluation of patients at risk for COVID-19 are in a state of rapid change based on information released by regulatory bodies including the CDC and federal and state organizations. These policies and algorithms were followed during the patient's care in the ED.  Some ED evaluations and  interventions may be delayed as a result of limited staffing during the pandemic.*  Note:  This document was prepared using Dragon voice recognition software and may include unintentional dictation errors.   Darci Current, MD 10/18/19 [phone]

## 2019-10-18 NOTE — BH Assessment (Signed)
Unable to assess patient at this time.  Patient is asleep and will not stay up long enough to answer questions.

## 2019-10-18 NOTE — Discharge Instructions (Signed)
Panic Attack  A panic attack is when you suddenly feel very afraid, uncomfortable, or nervous (anxious). A panic attack can happen when you are scared or for no reason. A panic attack can feel like a serious problem. It can even feel like a heart attack or stroke. See your doctor when you have a panic attack to make sure you do not have a serious problem. Follow these instructions at home:  Take medicines only as told by your doctor.  If you feel worried or nervous, try not to have caffeine.  Take good care of your health. To do this: ? Eat healthy. Make sure to eat fresh fruits and vegetables, whole grains, lean meats, and low-fat dairy. ? Get enough sleep. Try to sleep for 7-8 hours each night. ? Exercise. Try to be active for 30 minutes 5 or more days a week. ? Do not smoke. Talk to your doctor if you need help quitting. ? Limit how much alcohol you drink:  If you are a woman who is not pregnant: try not to have more than 1 drink a day.  If you are a man: try not to have more than 2 drinks a day.  One drink equals 12 oz of beer, 5 oz of wine, or 1 oz of hard liquor.  Keep all follow-up visits as told by your doctor. This is important. Contact a doctor if:  Your symptoms do not get better.  Your symptoms get worse.  You are not able to take your medicines as told. Get help right away if:  You have thoughts of hurting yourself or others.  You have symptoms of a panic attack. Do not drive yourself to the hospital. Have someone else drive you or call an ambulance. If you feel like you may hurt yourself or others, or have thoughts about taking your own life, get help right away. You can go to your nearest emergency department or call:  Your local emergency services (911 in the [place]  A suicide crisis helpline, such as the National Suicide Prevention Lifeline at 1-800-273-8255. This is open 24 hours a day. Summary  A panic attack is when you suddenly feel very afraid,  uncomfortable, or nervous (anxious).  See your doctor when you have a panic attack to make sure that you do not have another serious problem.  If you feel like you may hurt yourself or others, get help right away by calling 911. This information is not intended to replace advice given to you by your health care provider. Make sure you discuss any questions you have with your health care provider. Document Revised: 09/07/2017 Document Reviewed: 11/08/2016 Elsevier Patient Education  2020 Else[employer] Inc.  

## 2019-10-18 NOTE — ED Notes (Signed)
Pt given meal tray but does not want to eat it. Pt calm and cooperative at this time. Pt does not want to shower. Pt given warm blankets. Pt denies and SI/HI.

## 2019-10-18 NOTE — ED Notes (Signed)
Reports panic attack at home last night, described as chest tightness and SOB. Pt has hx of same, does not like taking his prescribed medication due to how "strong" it is. Pt reports using alcohol tonight, denies any drug use. Cooperative with staff. Unsteady on feet. Denies chest pain currently. Endorsed SI with triage nurse. No HI.

## 2019-10-18 NOTE — BH Assessment (Signed)
Writer spoke with patient to complete updated assessment. Patient denies SI/HI and AV/H. He connected with [facility] for mental health outpatient treatment and is to follow up with them.

## 2019-12-09 ENCOUNTER — Encounter: Payer: Self-pay | Admitting: *Deleted

## 2019-12-09 ENCOUNTER — Other Ambulatory Visit: Payer: Self-pay

## 2019-12-09 ENCOUNTER — Emergency Department
Admission: EM | Admit: 2019-12-09 | Discharge: 2019-12-09 | Disposition: A | Discharge status: Home / Self Care | Payer: Medicaid Other | Attending: Emergency Medicine | Admitting: Emergency Medicine

## 2019-12-09 ENCOUNTER — Emergency Department: Payer: Medicaid Other

## 2019-12-09 DIAGNOSIS — I10 Essential (primary) hypertension: Secondary | ICD-10-CM | POA: Insufficient documentation

## 2019-12-09 DIAGNOSIS — Z79899 Other long term (current) drug therapy: Secondary | ICD-10-CM | POA: Insufficient documentation

## 2019-12-09 DIAGNOSIS — Y929 Unspecified place or not applicable: Secondary | ICD-10-CM | POA: Diagnosis not present

## 2019-12-09 DIAGNOSIS — Y998 Other external cause status: Secondary | ICD-10-CM | POA: Insufficient documentation

## 2019-12-09 DIAGNOSIS — F17210 Nicotine dependence, cigarettes, uncomplicated: Secondary | ICD-10-CM | POA: Insufficient documentation

## 2019-12-09 DIAGNOSIS — S82832A Other fracture of upper and lower end of left fibula, initial encounter for closed fracture: Secondary | ICD-10-CM | POA: Insufficient documentation

## 2019-12-09 DIAGNOSIS — X509XXA Other and unspecified overexertion or strenuous movements or postures, initial encounter: Secondary | ICD-10-CM | POA: Diagnosis not present

## 2019-12-09 DIAGNOSIS — F1210 Cannabis abuse, uncomplicated: Secondary | ICD-10-CM | POA: Insufficient documentation

## 2019-12-09 DIAGNOSIS — S82839A Other fracture of upper and lower end of unspecified fibula, initial encounter for closed fracture: Secondary | ICD-10-CM

## 2019-12-09 DIAGNOSIS — Y9383 Activity, rough housing and horseplay: Secondary | ICD-10-CM | POA: Diagnosis not present

## 2019-12-09 DIAGNOSIS — S99912A Unspecified injury of left ankle, initial encounter: Secondary | ICD-10-CM | POA: Diagnosis present

## 2019-12-09 MED ORDER — OXYCODONE-ACETAMINOPHEN 5-325 MG PO TABS
2.0000 | ORAL_TABLET | Freq: Once | ORAL | Status: AC
  Administered 2019-12-09: 2 via ORAL
  Filled 2019-12-09: qty 2

## 2019-12-09 MED ORDER — OXYCODONE-ACETAMINOPHEN 5-325 MG PO TABS: 2.0000 | ORAL_TABLET | Freq: Four times a day (QID) | ORAL | 0 refills | Status: DC | PRN

## 2019-12-09 NOTE — ED Notes (Signed)
Patient continues to tell this RN he is scared of hospitals and is now complaining of pain in both legs
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
## 2019-12-09 NOTE — Discharge Instructions (Signed)
As we discussed, you have a broken bone (the fibula) that needs to stay immobilized in a splint for now.  Please do not put any weight on your left leg and use the crutches to get around.  Keep your leg elevated when you are sitting or lying down (you can put it on a cushion or pillow).  Please use cold packs or ice packs on top of the splint to help with swelling and pain.  Use over-the-counter pain medicine such as ibuprofen and Tylenol according to label instructions.  Take Percocet as prescribed for severe pain. Do not drink alcohol, drive or participate in any other potentially dangerous activities while taking this medication as it may make you sleepy. Do not take this medication with any other sedating medications, either prescription or over-the-counter. If you were prescribed Percocet or Vicodin, do not take these with acetaminophen (Tylenol) as it is already contained within these medications.   This medication is an opiate (or narcotic) pain medication and can be habit forming.  Use it as little as possible to achieve adequate pain control.  Do not use or use it with extreme caution if you have a history of opiate abuse or dependence.  If you are on a pain contract with your primary care doctor or a pain specialist, be sure to let them know you were prescribed this medication today from the [facility] Emergency Department.  This medication is intended for your use only - do not give any to anyone else and keep it in a secure place where nobody else, especially children, have access to it.  It will also cause or worsen constipation, so you may want to consider taking an over-the-counter stool softener while you are taking this medication.  You need to call the orthopedic clinic in the morning to schedule a follow-up appointment.  Let them know that the emergency medicine doctor spoke with Dr. Martha Clan who said you need a follow-up appointment and clinic later this week.     Return to the  emergency department if you develop new or worsening symptoms that concern you.

## 2019-12-09 NOTE — ED Notes (Signed)
Patient is attempting to reach family members to pick him up

## 2019-12-09 NOTE — ED Notes (Signed)
Stirrup splint applied to patient's L lower leg, no difficulties. Patient instructed on keeping the cast dry and intact. Patient notified that he must stay non-weight bearing on the L until he sees the orthopedist. Crutches provided

## 2019-12-10 ENCOUNTER — Other Ambulatory Visit: Payer: Self-pay | Admitting: Orthopedic Surgery

## 2019-12-12 ENCOUNTER — Other Ambulatory Visit
Admission: RE | Admit: 2019-12-12 | Discharge: 2019-12-12 | Disposition: A | Discharge status: Home / Self Care | Payer: Medicaid Other | Source: Ambulatory Visit | Attending: Orthopedic Surgery | Admitting: Orthopedic Surgery

## 2019-12-12 NOTE — Progress Notes (Signed)
No show for covid test today, a message was left on patient's phone and Dr. Samuel Germany office was notified by [facility] receptionist Katie

## 2019-12-15 ENCOUNTER — Other Ambulatory Visit
Admission: RE | Admit: 2019-12-15 | Discharge: 2019-12-15 | Disposition: A | Discharge status: Home / Self Care | Payer: Medicaid Other | Source: Ambulatory Visit | Attending: Orthopedic Surgery | Admitting: Orthopedic Surgery

## 2019-12-15 DIAGNOSIS — Z01812 Encounter for preprocedural laboratory examination: Secondary | ICD-10-CM | POA: Diagnosis not present

## 2019-12-15 DIAGNOSIS — Z20822 Contact with and (suspected) exposure to covid-19: Secondary | ICD-10-CM | POA: Insufficient documentation

## 2019-12-15 LAB — BASIC METABOLIC PANEL
Anion gap: 9 (ref 5–15)
BUN: 15 mg/dL (ref 6–20)
CO2: 24 mmol/L (ref 22–32)
Calcium: 9.4 mg/dL (ref 8.9–10.3)
Chloride: 106 mmol/L (ref 98–111)
Creatinine, Ser: 0.96 mg/dL (ref 0.61–1.24)
GFR calc Af Amer: >60 mL/min (ref >60)
GFR calc non Af Amer: >60 mL/min (ref >60)
Glucose, Bld: 138 mg/dL — ABNORMAL HIGH (ref 70–99)
Potassium: 3.9 mmol/L (ref 3.5–5.1)
Sodium: 139 mmol/L (ref 135–145)

## 2019-12-15 LAB — CBC WITH DIFFERENTIAL/PLATELET
Abs Immature Granulocytes: 0.02 10*3/uL (ref 0.00–0.07)
Basophils Absolute: 0.1 10*3/uL (ref 0.0–0.1)
Basophils Relative: 1 %
Eosinophils Absolute: 0.2 10*3/uL (ref 0.0–0.5)
Eosinophils Relative: 3 %
HCT: 42.5 % (ref 39.0–52.0)
Hemoglobin: 14.1 g/dL (ref 13.0–17.0)
Immature Granulocytes: 0 %
Lymphocytes Relative: 23 %
Lymphs Abs: 2.1 10*3/uL (ref 0.7–4.0)
MCH: 29.5 pg (ref 26.0–34.0)
MCHC: 33.2 g/dL (ref 30.0–36.0)
MCV: 88.9 fL (ref 80.0–100.0)
Monocytes Absolute: 0.4 10*3/uL (ref 0.1–1.0)
Monocytes Relative: 5 %
Neutro Abs: 6.3 10*3/uL (ref 1.7–7.7)
Neutrophils Relative %: 68 %
Platelets: 388 10*3/uL (ref 150–400)
RBC: 4.78 MIL/uL (ref 4.22–5.81)
RDW: 12.6 % (ref 11.5–15.5)
WBC: 9.2 10*3/uL (ref 4.0–10.5)
nRBC: 0 % (ref 0.0–0.2)

## 2019-12-15 LAB — PROTIME-INR
INR: 1 (ref 0.8–1.2)
Prothrombin Time: 12.6 seconds (ref 11.4–15.2)

## 2019-12-15 LAB — SARS CORONAVIRUS 2 (TAT 6-24 HRS): SARS Coronavirus 2: NEGATIVE

## 2019-12-15 LAB — APTT: aPTT: 37 seconds — ABNORMAL HIGH (ref 24–36)

## 2019-12-16 ENCOUNTER — Ambulatory Visit: Payer: Medicaid Other | Admitting: Anesthesiology

## 2019-12-16 ENCOUNTER — Encounter
Admission: RE | Disposition: A | Discharge status: Home / Self Care | Payer: Self-pay | Source: Home / Self Care | Attending: Orthopedic Surgery

## 2019-12-16 ENCOUNTER — Encounter: Payer: Self-pay | Admitting: Orthopedic Surgery

## 2019-12-16 ENCOUNTER — Other Ambulatory Visit: Payer: Self-pay

## 2019-12-16 ENCOUNTER — Ambulatory Visit: Payer: Medicaid Other

## 2019-12-16 ENCOUNTER — Observation Stay
Admission: RE | Admit: 2019-12-16 | Discharge: 2019-12-17 | Disposition: A | Discharge status: Home / Self Care | Payer: Medicaid Other | Attending: Orthopedic Surgery | Admitting: Orthopedic Surgery

## 2019-12-16 DIAGNOSIS — R262 Difficulty in walking, not elsewhere classified: Secondary | ICD-10-CM | POA: Insufficient documentation

## 2019-12-16 DIAGNOSIS — Z79899 Other long term (current) drug therapy: Secondary | ICD-10-CM | POA: Insufficient documentation

## 2019-12-16 DIAGNOSIS — Z87891 Personal history of nicotine dependence: Secondary | ICD-10-CM | POA: Insufficient documentation

## 2019-12-16 DIAGNOSIS — S82452A Displaced comminuted fracture of shaft of left fibula, initial encounter for closed fracture: Secondary | ICD-10-CM | POA: Diagnosis not present

## 2019-12-16 DIAGNOSIS — I10 Essential (primary) hypertension: Secondary | ICD-10-CM | POA: Insufficient documentation

## 2019-12-16 DIAGNOSIS — R2689 Other abnormalities of gait and mobility: Secondary | ICD-10-CM | POA: Insufficient documentation

## 2019-12-16 DIAGNOSIS — M6281 Muscle weakness (generalized): Secondary | ICD-10-CM | POA: Insufficient documentation

## 2019-12-16 DIAGNOSIS — S82892A Other fracture of left lower leg, initial encounter for closed fracture: Secondary | ICD-10-CM | POA: Diagnosis present

## 2019-12-16 DIAGNOSIS — X58XXXA Exposure to other specified factors, initial encounter: Secondary | ICD-10-CM | POA: Diagnosis not present

## 2019-12-16 DIAGNOSIS — F419 Anxiety disorder, unspecified: Secondary | ICD-10-CM | POA: Diagnosis not present

## 2019-12-16 DIAGNOSIS — S8263XA Displaced fracture of lateral malleolus of unspecified fibula, initial encounter for closed fracture: Secondary | ICD-10-CM

## 2019-12-16 HISTORY — PX: ORIF ANKLE FRACTURE: SHX5408

## 2019-12-16 LAB — URINE DRUG SCREEN, QUALITATIVE (ARMC ONLY)
Amphetamines, Ur Screen: NOT DETECTED
Barbiturates, Ur Screen: NOT DETECTED
Benzodiazepine, Ur Scrn: NOT DETECTED
Cannabinoid 50 Ng, Ur ~~LOC~~: NOT DETECTED
Cocaine Metabolite,Ur ~~LOC~~: NOT DETECTED
MDMA (Ecstasy)Ur Screen: NOT DETECTED
Methadone Scn, Ur: NOT DETECTED
Opiate, Ur Screen: NOT DETECTED
Phencyclidine (PCP) Ur S: NOT DETECTED
Tricyclic, Ur Screen: NOT DETECTED

## 2019-12-16 SURGERY — OPEN REDUCTION INTERNAL FIXATION (ORIF) ANKLE FRACTURE
Anesthesia: General | Site: Ankle | Laterality: Left

## 2019-12-16 MED ORDER — FENTANYL CITRATE (PF) 100 MCG/2ML IJ SOLN
25.0000 ug | INTRAMUSCULAR | Status: AC | PRN
  Administered 2019-12-16 (×6): 25 ug via INTRAVENOUS

## 2019-12-16 MED ORDER — MIDAZOLAM HCL 2 MG/2ML IJ SOLN
INTRAMUSCULAR | Status: AC
  Filled 2019-12-16: qty 2

## 2019-12-16 MED ORDER — LACTATED RINGERS IV SOLN: INTRAVENOUS | Status: DC

## 2019-12-16 MED ORDER — ACETAMINOPHEN 325 MG PO TABS: 325.0000 mg | ORAL_TABLET | Freq: Four times a day (QID) | ORAL | Status: DC | PRN

## 2019-12-16 MED ORDER — ACETAMINOPHEN 10 MG/ML IV SOLN
INTRAVENOUS | Status: DC | PRN
  Administered 2019-12-16: 1000 mg via INTRAVENOUS

## 2019-12-16 MED ORDER — DEXAMETHASONE SODIUM PHOSPHATE 10 MG/ML IJ SOLN
INTRAMUSCULAR | Status: DC | PRN
  Administered 2019-12-16: 5 mg via INTRAVENOUS

## 2019-12-16 MED ORDER — CHLORHEXIDINE GLUCONATE CLOTH 2 % EX PADS
6.0000 | MEDICATED_PAD | Freq: Once | CUTANEOUS | Status: AC
  Administered 2019-12-16: 6 via TOPICAL

## 2019-12-16 MED ORDER — NEOMYCIN-POLYMYXIN B GU 40-200000 IR SOLN
Status: AC
  Filled 2019-12-16: qty 2

## 2019-12-16 MED ORDER — FENTANYL CITRATE (PF) 250 MCG/5ML IJ SOLN
INTRAMUSCULAR | Status: AC
  Filled 2019-12-16: qty 5

## 2019-12-16 MED ORDER — BUPIVACAINE HCL (PF) 0.25 % IJ SOLN
INTRAMUSCULAR | Status: AC
  Filled 2019-12-16: qty 30

## 2019-12-16 MED ORDER — FAMOTIDINE 20 MG PO TABS
20.0000 mg | ORAL_TABLET | Freq: Once | ORAL | Status: AC
  Administered 2019-12-16: 20 mg via ORAL

## 2019-12-16 MED ORDER — ONDANSETRON HCL 4 MG/2ML IJ SOLN: 4.0000 mg | Freq: Four times a day (QID) | INTRAMUSCULAR | Status: DC | PRN

## 2019-12-16 MED ORDER — LIDOCAINE HCL (CARDIAC) PF 100 MG/5ML IV SOSY
PREFILLED_SYRINGE | INTRAVENOUS | Status: DC | PRN
  Administered 2019-12-16: 100 mg via INTRAVENOUS

## 2019-12-16 MED ORDER — OXYCODONE HCL 5 MG PO TABS
10.0000 mg | ORAL_TABLET | ORAL | Status: DC | PRN
  Administered 2019-12-16: 15 mg via ORAL
  Filled 2019-12-16: qty 3

## 2019-12-16 MED ORDER — ASPIRIN EC 325 MG PO TBEC
325.0000 mg | DELAYED_RELEASE_TABLET | Freq: Every day | ORAL | Status: DC
  Administered 2019-12-17: 325 mg via ORAL
  Filled 2019-12-16: qty 1

## 2019-12-16 MED ORDER — TRAZODONE HCL 50 MG PO TABS: 50.0000 mg | ORAL_TABLET | Freq: Every evening | ORAL | Status: DC | PRN

## 2019-12-16 MED ORDER — ALUM & MAG HYDROXIDE-SIMETH 200-200-20 MG/5ML PO SUSP: 30.0000 mL | ORAL | Status: DC | PRN

## 2019-12-16 MED ORDER — OXYCODONE HCL 5 MG PO TABS
5.0000 mg | ORAL_TABLET | ORAL | Status: DC | PRN
  Administered 2019-12-17: 10 mg via ORAL
  Filled 2019-12-16: qty 2

## 2019-12-16 MED ORDER — BISACODYL 10 MG RE SUPP: 10.0000 mg | Freq: Every day | RECTAL | Status: DC | PRN

## 2019-12-16 MED ORDER — DEXMEDETOMIDINE HCL IN NACL 200 MCG/50ML IV SOLN
INTRAVENOUS | Status: AC
  Filled 2019-12-16: qty 50

## 2019-12-16 MED ORDER — CEFAZOLIN SODIUM-DEXTROSE 2-4 GM/100ML-% IV SOLN
INTRAVENOUS | Status: AC
  Filled 2019-12-16: qty 100

## 2019-12-16 MED ORDER — METHOCARBAMOL 500 MG PO TABS
500.0000 mg | ORAL_TABLET | Freq: Four times a day (QID) | ORAL | Status: DC | PRN
  Administered 2019-12-16 – 2019-12-17 (×2): 500 mg via ORAL
  Filled 2019-12-16 (×2): qty 1

## 2019-12-16 MED ORDER — SODIUM CHLORIDE 0.9 % IV SOLN
75.0000 mL/h | INTRAVENOUS | Status: DC
  Administered 2019-12-16: 75 mL/h via INTRAVENOUS

## 2019-12-16 MED ORDER — MAGNESIUM CITRATE PO SOLN
1.0000 | Freq: Once | ORAL | Status: DC | PRN
  Filled 2019-12-16: qty 296

## 2019-12-16 MED ORDER — LURASIDONE HCL 40 MG PO TABS
40.0000 mg | ORAL_TABLET | Freq: Every day | ORAL | Status: DC
  Filled 2019-12-16 (×2): qty 1

## 2019-12-16 MED ORDER — ACETAMINOPHEN 500 MG PO TABS
1000.0000 mg | ORAL_TABLET | Freq: Four times a day (QID) | ORAL | Status: DC
  Administered 2019-12-16 – 2019-12-17 (×3): 1000 mg via ORAL
  Filled 2019-12-16 (×3): qty 2

## 2019-12-16 MED ORDER — HYDROMORPHONE HCL 1 MG/ML IJ SOLN
INTRAMUSCULAR | Status: AC
  Administered 2019-12-16: 1 mg via INTRAVENOUS
  Filled 2019-12-16: qty 1

## 2019-12-16 MED ORDER — METOPROLOL TARTRATE 5 MG/5ML IV SOLN
INTRAVENOUS | Status: DC | PRN
  Administered 2019-12-16 (×2): 2.5 mg via INTRAVENOUS

## 2019-12-16 MED ORDER — HYDROMORPHONE HCL 1 MG/ML IJ SOLN
INTRAMUSCULAR | Status: DC | PRN
  Administered 2019-12-16 (×2): .5 mg via INTRAVENOUS

## 2019-12-16 MED ORDER — FENTANYL CITRATE (PF) 100 MCG/2ML IJ SOLN
INTRAMUSCULAR | Status: DC | PRN
  Administered 2019-12-16: 25 ug via INTRAVENOUS
  Administered 2019-12-16: 100 ug via INTRAVENOUS
  Administered 2019-12-16: 75 ug via INTRAVENOUS
  Administered 2019-12-16: 50 ug via INTRAVENOUS

## 2019-12-16 MED ORDER — ONDANSETRON HCL 4 MG/2ML IJ SOLN
INTRAMUSCULAR | Status: DC | PRN
  Administered 2019-12-16: 4 mg via INTRAVENOUS

## 2019-12-16 MED ORDER — FAMOTIDINE 20 MG PO TABS
ORAL_TABLET | ORAL | Status: AC
  Filled 2019-12-16: qty 1

## 2019-12-16 MED ORDER — BUPIVACAINE HCL 0.25 % IJ SOLN
INTRAMUSCULAR | Status: DC | PRN
  Administered 2019-12-16: 30 mL

## 2019-12-16 MED ORDER — METHOCARBAMOL 1000 MG/10ML IJ SOLN
500.0000 mg | Freq: Four times a day (QID) | INTRAVENOUS | Status: DC | PRN
  Filled 2019-12-16: qty 5

## 2019-12-16 MED ORDER — ACETAMINOPHEN 10 MG/ML IV SOLN
INTRAVENOUS | Status: AC
  Filled 2019-12-16: qty 100

## 2019-12-16 MED ORDER — KETOROLAC TROMETHAMINE 15 MG/ML IJ SOLN
INTRAMUSCULAR | Status: AC
  Filled 2019-12-16: qty 1

## 2019-12-16 MED ORDER — DOCUSATE SODIUM 100 MG PO CAPS
100.0000 mg | ORAL_CAPSULE | Freq: Two times a day (BID) | ORAL | Status: DC
  Administered 2019-12-16 – 2019-12-17 (×2): 100 mg via ORAL
  Filled 2019-12-16 (×2): qty 1

## 2019-12-16 MED ORDER — MIDAZOLAM HCL 2 MG/2ML IJ SOLN
INTRAMUSCULAR | Status: DC | PRN
  Administered 2019-12-16: 2 mg via INTRAVENOUS

## 2019-12-16 MED ORDER — HYDROMORPHONE HCL 1 MG/ML IJ SOLN
INTRAMUSCULAR | Status: AC
  Filled 2019-12-16: qty 1

## 2019-12-16 MED ORDER — POLYETHYLENE GLYCOL 3350 17 G PO PACK: 17.0000 g | PACK | Freq: Every day | ORAL | Status: DC | PRN

## 2019-12-16 MED ORDER — SENNA 8.6 MG PO TABS
1.0000 | ORAL_TABLET | Freq: Two times a day (BID) | ORAL | Status: DC
  Administered 2019-12-16 – 2019-12-17 (×2): 8.6 mg via ORAL
  Filled 2019-12-16 (×2): qty 1

## 2019-12-16 MED ORDER — KETOROLAC TROMETHAMINE 15 MG/ML IJ SOLN
7.5000 mg | Freq: Four times a day (QID) | INTRAMUSCULAR | Status: AC
  Administered 2019-12-16 – 2019-12-17 (×4): 7.5 mg via INTRAVENOUS
  Filled 2019-12-16 (×3): qty 1

## 2019-12-16 MED ORDER — AMLODIPINE BESYLATE 5 MG PO TABS
5.0000 mg | ORAL_TABLET | Freq: Every day | ORAL | Status: DC
  Administered 2019-12-16 – 2019-12-17 (×2): 5 mg via ORAL
  Filled 2019-12-16 (×2): qty 1

## 2019-12-16 MED ORDER — NEOMYCIN-POLYMYXIN B GU 40-200000 IR SOLN
Status: DC | PRN
  Administered 2019-12-16: 2 mL

## 2019-12-16 MED ORDER — PROPOFOL 10 MG/ML IV BOLUS
INTRAVENOUS | Status: AC
  Filled 2019-12-16: qty 20

## 2019-12-16 MED ORDER — CEFAZOLIN SODIUM-DEXTROSE 2-4 GM/100ML-% IV SOLN
2.0000 g | INTRAVENOUS | Status: AC
  Administered 2019-12-16: 2 g via INTRAVENOUS

## 2019-12-16 MED ORDER — FENTANYL CITRATE (PF) 100 MCG/2ML IJ SOLN
INTRAMUSCULAR | Status: AC
  Administered 2019-12-16: 25 ug via INTRAVENOUS
  Filled 2019-12-16: qty 2

## 2019-12-16 MED ORDER — ONDANSETRON HCL 4 MG/2ML IJ SOLN
INTRAMUSCULAR | Status: AC
  Administered 2019-12-16: 4 mg via INTRAVENOUS
  Filled 2019-12-16: qty 2

## 2019-12-16 MED ORDER — ONDANSETRON HCL 4 MG/2ML IJ SOLN: 4.0000 mg | Freq: Once | INTRAMUSCULAR | Status: AC | PRN

## 2019-12-16 MED ORDER — EPINEPHRINE PF 1 MG/ML IJ SOLN
INTRAMUSCULAR | Status: AC
  Filled 2019-12-16: qty 1

## 2019-12-16 MED ORDER — PROPOFOL 10 MG/ML IV BOLUS
INTRAVENOUS | Status: DC | PRN
  Administered 2019-12-16: 200 mg via INTRAVENOUS

## 2019-12-16 MED ORDER — DEXMEDETOMIDINE HCL 200 MCG/2ML IV SOLN
INTRAVENOUS | Status: DC | PRN
  Administered 2019-12-16 (×2): 8 ug via INTRAVENOUS
  Administered 2019-12-16: 12 ug via INTRAVENOUS

## 2019-12-16 MED ORDER — OXYCODONE HCL 5 MG PO TABS
ORAL_TABLET | ORAL | Status: AC
  Administered 2019-12-16: 15 mg via ORAL
  Filled 2019-12-16: qty 3

## 2019-12-16 MED ORDER — CEFAZOLIN SODIUM-DEXTROSE 1-4 GM/50ML-% IV SOLN
1.0000 g | Freq: Four times a day (QID) | INTRAVENOUS | Status: AC
  Administered 2019-12-16 – 2019-12-17 (×2): 1 g via INTRAVENOUS
  Filled 2019-12-16 (×2): qty 50

## 2019-12-16 MED ORDER — CHLORHEXIDINE GLUCONATE CLOTH 2 % EX PADS: 6.0000 | MEDICATED_PAD | Freq: Once | CUTANEOUS | Status: DC

## 2019-12-16 MED ORDER — ONDANSETRON HCL 4 MG PO TABS: 4.0000 mg | ORAL_TABLET | Freq: Four times a day (QID) | ORAL | Status: DC | PRN

## 2019-12-16 MED ORDER — HYDROMORPHONE HCL 1 MG/ML IJ SOLN: 0.5000 mg | INTRAMUSCULAR | Status: DC | PRN

## 2019-12-16 SURGICAL SUPPLY — 48 items
BIT DRILL 2.5X110 QC LCP DISP (BIT) ×2 IMPLANT
BLADE SURG 15 STRL LF DISP TIS (BLADE) ×1 IMPLANT
BLADE SURG 15 STRL SS (BLADE) ×1
BNDG ELASTIC 4X5.8 VLCR NS LF (GAUZE/BANDAGES/DRESSINGS) ×4 IMPLANT
BNDG ESMARK 6X12 TAN STRL LF (GAUZE/BANDAGES/DRESSINGS) ×2 IMPLANT
COVER WAND RF STERILE (DRAPES) ×2 IMPLANT
CUFF TOURN SGL QUICK 24 (TOURNIQUET CUFF)
CUFF TOURN SGL QUICK 30 (TOURNIQUET CUFF) ×1
CUFF TRNQT CYL 24X4X16.5-23 (TOURNIQUET CUFF) IMPLANT
CUFF TRNQT CYL 30X4X21-28X (TOURNIQUET CUFF) ×1 IMPLANT
DRAPE FLUOR MINI C-ARM 54X84 (DRAPES) ×2 IMPLANT
DRAPE INCISE IOBAN 66X45 STRL (DRAPES) ×2 IMPLANT
DRAPE U-SHAPE 47X51 STRL (DRAPES) ×2 IMPLANT
DURAPREP 26ML APPLICATOR (WOUND CARE) ×4 IMPLANT
ELECT REM PT RETURN 9FT ADLT (ELECTROSURGICAL) ×2
ELECTRODE REM PT RTRN 9FT ADLT (ELECTROSURGICAL) ×1 IMPLANT
GAUZE SPONGE 4X4 12PLY STRL (GAUZE/BANDAGES/DRESSINGS) ×2 IMPLANT
GAUZE XEROFORM 1X8 LF (GAUZE/BANDAGES/DRESSINGS) ×2 IMPLANT
GLOVE BIOGEL PI IND STRL 9 (GLOVE) ×1 IMPLANT
GLOVE BIOGEL PI INDICATOR 9 (GLOVE) ×1
GLOVE SURG 9.0 ORTHO LTXF (GLOVE) ×4 IMPLANT
GOWN STRL REUS TWL 2XL XL LVL4 (GOWN DISPOSABLE) ×2 IMPLANT
GOWN STRL REUS W/ TWL LRG LVL3 (GOWN DISPOSABLE) ×1 IMPLANT
GOWN STRL REUS W/TWL LRG LVL3 (GOWN DISPOSABLE) ×1
KIT TURNOVER KIT A (KITS) ×2 IMPLANT
LABEL OR SOLS (LABEL) ×2 IMPLANT
NS IRRIG 1000ML POUR BTL (IV SOLUTION) ×2 IMPLANT
PACK EXTREMITY ARMC (MISCELLANEOUS) ×2 IMPLANT
PAD ABD DERMACEA PRESS 5X9 (GAUZE/BANDAGES/DRESSINGS) ×4 IMPLANT
PAD CAST CTTN 4X4 STRL (SOFTGOODS) ×3 IMPLANT
PADDING CAST COTTON 4X4 STRL (SOFTGOODS) ×3
PLATE LCP 3.5 1/3 TUB 8HX93 (Plate) ×2 IMPLANT
REPAIR TROPE KNTLS SS SYNDESMO (Orthopedic Implant) ×2 IMPLANT
SCREW CORTEX 3.5 12MM (Screw) ×4 IMPLANT
SCREW CORTEX 3.5 14MM (Screw) ×2 IMPLANT
SCREW CORTEX 3.5 20MM (Screw) ×1 IMPLANT
SCREW LOCK CORT ST 3.5X12 (Screw) ×4 IMPLANT
SCREW LOCK CORT ST 3.5X14 (Screw) ×2 IMPLANT
SCREW LOCK CORT ST 3.5X20 (Screw) ×1 IMPLANT
SPLINT CAST 1 STEP 4X30 (MISCELLANEOUS) ×4 IMPLANT
SPONGE LAP 18X18 RF (DISPOSABLE) ×2 IMPLANT
STAPLER SKIN PROX 35W (STAPLE) ×2 IMPLANT
STOCKINETTE STRL 6IN 960660 (GAUZE/BANDAGES/DRESSINGS) ×2 IMPLANT
STRIP CLOSURE SKIN 1/2X4 (GAUZE/BANDAGES/DRESSINGS) ×4 IMPLANT
SUT VIC AB 2-0 SH 27 (SUTURE) ×2
SUT VIC AB 2-0 SH 27XBRD (SUTURE) ×2 IMPLANT
SYR 30ML LL (SYRINGE) ×2 IMPLANT
TAPE MICROPORE 2IN (TAPE) ×2 IMPLANT
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
## 2019-12-16 NOTE — Anesthesia Postprocedure Evaluation (Signed)
Anesthesia Post Note  Patient: Casey [name]  Procedure(s) Performed: OPEN REDUCTION INTERNAL FIXATION (ORIF) ANKLE FRACTURE (Left Ankle)  Patient location during evaluation: PACU Anesthesia Type: General Level of consciousness: awake and alert Pain management: pain level controlled Vital Signs Assessment: post-procedure vital signs reviewed and stable Respiratory status: spontaneous breathing, nonlabored ventilation, respiratory function stable and patient connected to nasal cannula oxygen Cardiovascular status: blood pressure returned to baseline and stable Postop Assessment: no apparent nausea or vomiting Anesthetic complications: no     Last Vitals:  Vitals:   12/16/19 1758 12/16/19 1842  BP: (!) 148/101 (!) 138/96  Pulse: 90 99  Resp: 16 16  Temp: (!) 36.3 C 36.9 C  SpO2: 96% 99%    Last Pain:  Vitals:   12/16/19 1842  TempSrc: Axillary  PainSc: 5                  Lenard Simmer

## 2019-12-16 NOTE — Transfer of Care (Signed)
Immediate Anesthesia Transfer of Care Note  Patient: Casey [name]  Procedure(s) Performed: OPEN REDUCTION INTERNAL FIXATION (ORIF) ANKLE FRACTURE (Left Ankle)  Patient Location: PACU  Anesthesia Type:General  Level of Consciousness: awake  Airway & Oxygen Therapy: Patient connected to face mask oxygen  Post-op Assessment: Post -op Vital signs reviewed and stable  Post vital signs: stable  Last Vitals:  Vitals Value Taken Time  BP 125/90 12/16/19 1552  Temp 99.7f   Pulse 94 12/16/19 1552  Resp 23 12/16/19 1552  SpO2 100 % 12/16/19 1552  Vitals shown include unvalidated device data.  Last Pain:  Vitals:   12/16/19 1205  TempSrc: Temporal  PainSc: 9          Complications: No apparent anesthesia complications

## 2019-12-16 NOTE — Anesthesia Preprocedure Evaluation (Signed)
Anesthesia Evaluation  Patient identified by MRN, date of birth, ID band Patient awake    Reviewed: Allergy & Precautions, NPO status , Patient's Chart, lab work & pertinent test results  History of Anesthesia Complications Negative for: history of anesthetic complications  Airway Mallampati: II       Dental   Pulmonary neg sleep apnea, neg COPD, Current Smoker and Patient abstained from smoking., former smoker,           Cardiovascular hypertension, Pt. on medications (-) Past MI and (-) CHF (-) dysrhythmias (-) Valvular Problems/Murmurs     Neuro/Psych neg Seizures Anxiety Depression    GI/Hepatic Neg liver ROS, neg GERD  ,(+)     substance abuse  alcohol use and cocaine use,   Endo/Other  neg diabetes  Renal/GU negative Renal ROS     Musculoskeletal   Abdominal   Peds  Hematology   Anesthesia Other Findings   Reproductive/Obstetrics                             Anesthesia Physical Anesthesia Plan  ASA: II  Anesthesia Plan: General   Post-op Pain Management:    Induction: Intravenous  PONV Risk Score and Plan: 2 and Ondansetron and Dexamethasone  Airway Management Planned: Oral ETT  Additional Equipment:   Intra-op Plan:   Post-operative Plan:   Informed Consent: I have reviewed the patients History and Physical, chart, labs and discussed the procedure including the risks, benefits and alternatives for the proposed anesthesia with the patient or authorized representative who has indicated his/her understanding and acceptance.       Plan Discussed with:   Anesthesia Plan Comments:         Anesthesia Quick Evaluation

## 2019-12-16 NOTE — Op Note (Signed)
12/16/2019  4:34 PM  PATIENT:  Casey [name]    PRE-OPERATIVE DIAGNOSIS:  S82.62XA Left Ankle Fibula Fracture, possible syndesmosis injury  POST-OPERATIVE DIAGNOSIS:  Left comminuted fibula fracture with sprain of syndesmosis  PROCEDURE:  OPEN REDUCTION INTERNAL FIXATION (ORIF) LEFT FIBULA FRACTURE WITH TIGHTROPE FIXATION OF SYNDESMOSIS SPRAIN  SURGEON:  Thornton Park, MD  ANESTHESIA:   General  PREOPERATIVE INDICATIONS:  Casey [name] is a  29 y.o. male with a diagnosis of S70.62XA Left Ankle Fibula Fracture, possible syndesmosis injury who failed conservative measures and elected for surgical management.    I discussed the risks and benefits of surgery. The risks include but are not limited to infection, bleeding requiring blood transfusion, nerve or blood vessel injury, joint stiffness or loss of motion, persistent pain, weakness or instability, malunion, nonunion and hardware failure and the need for further surgery. Medical risks include but are not limited to DVT and pulmonary embolism, myocardial infarction, stroke, pneumonia, respiratory failure and death. Patient understood these risks and wished to proceed.   OPERATIVE IMPLANTS: Synthes 8 hole 1/3 tubular plate with 6 x 3.5 bicortical screws and Arthrex Stainless Steel tightrope  OPERATIVE FINDINGS: Syndesmosis sprain with widened medial clear space, comminuted fibula fracture above the syndesmosis  OPERATIVE PROCEDURE:   Patient was met in the preoperative area. A pre-op H&P was performed at the bedside. The left leg was signed my initials and the word yes according the hospital's correct site of surgery protocol. He was brought to the operating room where he underwent general anesthesia with LMA. The patient was placed supine on the operative table. A bump was placed under the left hip. A tourniquet was applied to the left thigh.  The lower extremity was prepped and draped in a sterile fashion. A timeout was performed to  verify the patient's name, date of birth, medical record number, correct site of surgery and correct procedure to be performed. It was also used to verify the patient received antibiotics, and that all appropriate instruments, implants and radiographic studies were available in the room. Once all in attendance were in agreement, the case began.  The left lower extremity was exsanguinated with an Esmarch. The tourniquet was inflated to 250 mmHg. A lateral incision was made over the fibula. The subcutaneous tissues were dissected with the Metzenbaum scissor and pickup. Care was taken to avoid injury to the superficial peroneal nerve. The fibula fracture was identified above the mortise and irrigated and fracture hematoma was removed. Soft tissue was removed from the fracture site using a periosteal elevator. A fracture reduction clamp was then used to reduce the fracture to an anatomic position.     The fibula fracture was then drilled in an posterior to anterior direction, perpendicular to the fracture site to allow for placement of the lag screw.   A single lag screw, 20 mm in length, was advanced across the fracture site by hand. This compressed the fracture.   An 8 hole, 1/3 tubular plate was then contoured and placed along the lateral fibula. Bicortical screws were placed proximal and distal to the fracture. The fracture reduction and hardware placement were confirmed on AP and lateral imaging.  Once the fibula was plated, the attention was turned to the syndesmotic stabilization. The K-wire included in the Arthrex tightrope set was placed through the distal most hole of the 1/3 tubular plate.  It's position was confirmed on flurorscan.   It was the overdrilled with a cannulated drill bit.  The Arthrex tightrope was then  advanced across the four cortices of the fibula and distal tibia.  A small stab incision was made medially to visualize button and ensure it flipped outside the medial cortex of the tibia.   The tightrope was then compressed by hand.   Fluoroscan imaging was used to ensure the mortise was symmetric.  The ankle was also stressed under fluoroscan and no medial clear space widening was observed after tightrope fixation.    The medial and lateral incisions were then copiously irrigated. The subcutaneous tissue laterally was closed with 0 and 2-0 Vicryl and the skin approximated staples. 4-0 nylon was used to close the lateral stab incision.  A dry sterile dressing was applied along with an AO splint. The patient's ankle was positioned in neutral. The pateint was then awoken from anesthesia, transferred to hospital bed and brought to the PACU in stable condition. I was scrubbed and present the entire case and all sharp and instrument counts were correct at conclusion the case. I spoke to the patient's girlfriend by phone for PACU to let her know the case was performed without complication and the patient was stable in recovery room.    Timoteo Gaul, MD

## 2019-12-16 NOTE — Anesthesia Procedure Notes (Signed)
Procedure Name: LMA Insertion Date/Time: 12/16/2019 2:03 PM Performed by: Clyde Lundborg, CRNA Pre-anesthesia Checklist: Patient identified, Emergency Drugs available, Suction available and Patient being monitored Patient Re-evaluated:Patient Re-evaluated prior to induction Oxygen Delivery Method: Circle system utilized Preoxygenation: Pre-oxygenation with 100% oxygen Induction Type: IV induction Ventilation: Mask ventilation without difficulty LMA: LMA inserted LMA Size: 4.0 Tube type: Oral Number of attempts: 1 Placement Confirmation: positive ETCO2,  breath sounds checked- equal and bilateral and CO2 detector Tube secured with: Tape Dental Injury: Teeth and Oropharynx as per pre-operative assessment

## 2019-12-17 DIAGNOSIS — S82452A Displaced comminuted fracture of shaft of left fibula, initial encounter for closed fracture: Secondary | ICD-10-CM | POA: Diagnosis not present

## 2019-12-17 LAB — BASIC METABOLIC PANEL
Anion gap: 7 (ref 5–15)
BUN: 15 mg/dL (ref 6–20)
CO2: 28 mmol/L (ref 22–32)
Calcium: 9.1 mg/dL (ref 8.9–10.3)
Chloride: 104 mmol/L (ref 98–111)
Creatinine, Ser: 0.86 mg/dL (ref 0.61–1.24)
GFR calc Af Amer: >60 mL/min (ref >60)
GFR calc non Af Amer: >60 mL/min (ref >60)
Glucose, Bld: 120 mg/dL — ABNORMAL HIGH (ref 70–99)
Potassium: 4.2 mmol/L (ref 3.5–5.1)
Sodium: 139 mmol/L (ref 135–145)

## 2019-12-17 LAB — CBC
HCT: 39 % (ref 39.0–52.0)
Hemoglobin: 13.1 g/dL (ref 13.0–17.0)
MCH: 30 pg (ref 26.0–34.0)
MCHC: 33.6 g/dL (ref 30.0–36.0)
MCV: 89.2 fL (ref 80.0–100.0)
Platelets: 373 10*3/uL (ref 150–400)
RBC: 4.37 MIL/uL (ref 4.22–5.81)
RDW: 12.3 % (ref 11.5–15.5)
WBC: 13.7 10*3/uL — ABNORMAL HIGH (ref 4.0–10.5)
nRBC: 0 % (ref 0.0–0.2)

## 2019-12-17 MED ORDER — OXYCODONE HCL 5 MG PO TABS: 5.0000 mg | ORAL_TABLET | ORAL | 0 refills | Status: DC | PRN

## 2019-12-17 MED ORDER — ASPIRIN 325 MG PO TBEC: 325.0000 mg | DELAYED_RELEASE_TABLET | Freq: Every day | ORAL | 0 refills | Status: DC

## 2019-12-17 MED ORDER — DOCUSATE SODIUM 100 MG PO CAPS: 100.0000 mg | ORAL_CAPSULE | Freq: Two times a day (BID) | ORAL | 0 refills | Status: DC

## 2019-12-17 NOTE — Progress Notes (Signed)
Physical Therapy Treatment Patient Details Name: Casey [name] MRN: [id number] DOB: 1991-05-06 Today's Date: 12/17/2019    History of Present Illness Pt is a 29 y.o. male s/p 12/16/19 ORIF L fibular fx with tightrope fixation of syndesmosis sprain secondary L comminuted fibular fx with sprain of syndesmosis (initial injury 3/2: splinted and provided crutches in ED 3/2).  PMH includes anxiety, htn, h/o eye surgery, panic attack, hand surgery, Ludwig's angina.    PT Comments    Pt resting in bed upon PT arrival and reports being nervous about stairs but agreeable with 2nd assist.  Modified independent with bed mobility; CGA to SBA with transfers with use of B axillary crutches; CGA x2 (for safety and per pt's request) navigating 4 steps (x2 trials) with UE support.  Pt able to maintain [place] L LE status during session.  Pt's HR did increase up to 150 bpm post stairs trial but within a couple minutes of sitting rest HR decreased to 96 bpm (nurse notified).  Pt reporting L ankle pain increasing to 10/10 after stairs and d/t elevated HR with activity and pain, deferred further activity (pt repositioned in bed end of session to elevate L LE above heart in order to improve pain status; nurse notified of pt's pain).  Pt would benefit from continued PT for overall strengthening, balance, and independence with functional mobility.    Follow Up Recommendations  Home health PT;Supervision for mobility/OOB     Equipment Recommendations  Crutches    Recommendations for Other Services OT consult     Precautions / Restrictions Precautions Precautions: Fall Restrictions Weight Bearing Restrictions: Yes LLE Weight Bearing: Non weight bearing Other Position/Activity Restrictions: L LE    Mobility  Bed Mobility Overal bed mobility: Modified Independent             General bed mobility comments: Semi-supine to/from sit without any noted difficulties  Transfers Overall transfer level: Needs  assistance Equipment used: Crutches Transfers: Sit to/from [employer] Transfers Sit to Stand: Supervision;Min guard Stand pivot transfers: Supervision;Min guard(bed to/from recliner)       General transfer comment: strong transfers noted; steady  Ambulation/Gait Ambulation/Gait assistance: Min guard Gait Distance (Feet): 90 Feet Assistive device: Crutches   Gait velocity: decreased   General Gait Details: Deferred ambulation d/t elevated HR with stairs training and increased L LE pain with activity   Stairs Stairs: Yes Stairs assistance: Min guard;+2 safety/equipment Stair Management: Forwards Number of Stairs: (4 steps x2 trials) General stair comments: ascended/descended 4 steps with B railings CGA x2 for safety; ascended/descended 4 steps with L axillary crutch and R UE on railing CGA x2 for safety (pt with initial posterior lean descending first step but pt able to self correct on own with R UE on railing); able to maintain [place] L LE status   Wheelchair Mobility    Modified Rankin (Stroke Patients Only)       Balance Overall balance assessment: Needs assistance Sitting-balance support: No upper extremity supported;Feet supported Sitting balance-Leahy Scale: Normal Sitting balance - Comments: steady sitting reaching outside BOS   Standing balance support: Single extremity supported Standing balance-Leahy Scale: Fair Standing balance comment: steady static standing with single UE support                            Cognition Arousal/Alertness: Awake/alert Behavior During Therapy: WFL for tasks assessed/performed Overall Cognitive Status: Within Functional Limits for tasks assessed  Exercises     General Comments General comments (skin integrity, edema, etc.): L LE elevated above heart end of session resting in bed.  Nursing cleared pt for participation in physical therapy.  Pt  agreeable to PT session.      Pertinent Vitals/Pain Pain Assessment: 0-10 Pain Score: 10-Worst pain ever Pain Location: L ankle Pain Descriptors / Indicators: Aching;Sore;Tender;Constant;Guarding;Operative site guarding;Discomfort Pain Intervention(s): Limited activity within patient's tolerance;Premedicated before session;Repositioned  O2 sats WFL on room air during sessions activities.    Home Living    Prior Function        PT Goals (current goals can now be found in the care plan section) Acute Rehab PT Goals Patient Stated Goal: to have less pain PT Goal Formulation: With patient Time For Goal Achievement: 12/31/19 Potential to Achieve Goals: Fair Progress towards PT goals: Progressing toward goals    Frequency    BID      PT Plan Current plan remains appropriate    Co-evaluation              AM-PAC PT "6 Clicks" Mobility   Outcome Measure  Help needed turning from your back to your side while in a flat bed without using bedrails?: None Help needed moving from lying on your back to sitting on the side of a flat bed without using bedrails?: None Help needed moving to and from a bed to a chair (including a wheelchair)?: A Little Help needed standing up from a chair using your arms (e.g., wheelchair or bedside chair)?: A Little Help needed to walk in hospital room?: A Little Help needed climbing 3-5 steps with a railing? : A Little 6 Click Score: 20    End of Session Equipment Utilized During Treatment: Gait belt Activity Tolerance: Patient limited by pain;Other (comment)(Limited d/t HR elevation with activity) Patient left: in bed;with call bell/phone within reach;with bed alarm set;with SCD's reapplied;Other (comment)(L LE elevated above heart via pillows) Nurse Communication: Mobility status;Precautions;Weight bearing status;Patient requests pain meds PT Visit Diagnosis: Other abnormalities of gait and mobility (R26.89);Muscle weakness (generalized)  (M62.81);Difficulty in walking, not elsewhere classified (R26.2);Pain Pain - Right/Left: Left Pain - part of body: Ankle and joints of foot     Time: 1601-0932 PT Time Calculation (min) (ACUTE ONLY): 28 min  Charges:  $Gait Training: 8-22 mins $Therapeutic Activity: 8-22 mins                     Hendricks Limes, PT 12/17/19, 2:51 PM

## 2019-12-17 NOTE — Discharge Summary (Signed)
Physician Discharge Summary  Patient ID: Casey [name] MRN: [id number] DOB/AGE: 1991/09/25 28 y.o.  Admit date: 12/16/2019 Discharge date: 12/17/2019  Admission Diagnoses:  S82.62XA Left Ankle Fibula Fracture, possible syndesmosis injury <principal problem not specified>  Discharge Diagnoses:  S82.62XA Left Ankle Fibula Fracture, possible syndesmosis injury Active Problems:   Closed left ankle fracture s/p ORIF of fibula fracture and fixation of syndemotic sprain  Past Medical History:  Diagnosis Date  . Anxiety   . H/O eye surgery   . Hypertension     Surgeries: Procedure(s): OPEN REDUCTION INTERNAL FIXATION (ORIF) LEFT FIBULA FRACTURE on 12/16/2019   Consultants (if any):   Discharged Condition: Improved  Hospital Course: Casey [name] is an 29 y.o. male who was admitted 12/16/2019 with a diagnosis of  S82.62XA Left Ankle Fibula Fracture, possible syndesmosis injury <principal problem not specified> and went to the operating room on 12/16/2019 and underwent an uncomplicated ORIF of the left fibula and syndesmosis.    He was given perioperative antibiotics:  Anti-infectives (From admission, onward)   Start     Dose/Rate Route Frequency Ordered Stop   12/16/19 2000  ceFAZolin (ANCEF) IVPB 1 g/50 mL premix     1 g 100 mL/hr over 30 Minutes Intravenous Every 6 hours 12/16/19 1746 12/17/19 0357   12/16/19 1227  ceFAZolin (ANCEF) 2-4 GM/100ML-% IVPB    Note to Pharmacy: Letta Pate   : cabinet override      12/16/19 1227 12/16/19 1418   12/16/19 1200  ceFAZolin (ANCEF) IVPB 2g/100 mL premix     2 g 200 mL/hr over 30 Minutes Intravenous On call to O.R. 12/16/19 1151 12/16/19 1418    .  He was given sequential compression devices, early ambulation, and aspirin for DVT prophylaxis.  He benefited maximally from the hospital stay and there were no complications.    Recent vital signs:  Vitals:   12/17/19 1033 12/17/19 1157  BP: (!) 143/96 (!) 136/91  Pulse: 86 71  Resp:   17  Temp:  98.2 F (36.8 C)  SpO2: 96% 98%    Recent laboratory studies:  Lab Results  Component Value Date   HGB 13.1 12/17/2019   HGB 14.1 12/15/2019   HGB 14.8 10/18/2019   Lab Results  Component Value Date   WBC 13.7 (H) 12/17/2019   PLT 373 12/17/2019   Lab Results  Component Value Date   INR 1.0 12/15/2019   Lab Results  Component Value Date   NA 139 12/17/2019   K 4.2 12/17/2019   CL 104 12/17/2019   CO2 28 12/17/2019   BUN 15 12/17/2019   CREATININE 0.86 12/17/2019   GLUCOSE 120 (H) 12/17/2019    Discharge Medications:   Allergies as of 12/17/2019   No Known Allergies     Medication List    STOP taking these medications   gabapentin 300 MG capsule Commonly known as: NEURONTIN   lurasidone 40 MG Tabs tablet Commonly known as: LATUDA   oxyCODONE-acetaminophen 5-325 MG tablet Commonly known as: Percocet   traZODone 50 MG tablet Commonly known as: DESYREL     TAKE these medications   amLODipine 5 MG tablet Commonly known as: NORVASC Take 1 tablet (5 mg total) by mouth daily.   aspirin 325 MG EC tablet Take 1 tablet (325 mg total) by mouth daily with breakfast. Start taking on: December 18, 2019   docusate sodium 100 MG capsule Commonly known as: COLACE Take 1 capsule (100 mg total) by mouth 2 (two) times daily.  oxyCODONE 5 MG immediate release tablet Commonly known as: Oxy IR/ROXICODONE Take 1 tablet (5 mg total) by mouth every 4 (four) hours as needed for moderate pain (pain score 4-6).       Diagnostic Studies: DG Ankle Complete Left  Result Date: 12/09/2019 CLINICAL DATA:  Initial evaluation for acute trauma, injury. EXAM: LEFT ANKLE COMPLETE - 3+ VIEW COMPARISON:  None. FINDINGS: There is an acute oblique fracture through the distal left fibula with mild lateral displacement. No other acute osseous abnormality. Diffuse soft tissue swelling present about the ankle. IMPRESSION: Acute oblique fracture through the distal left fibula with  mild lateral displacement. Electronically Signed   By: Jeannine Boga M.D.   On: 12/09/2019 01:09   DG Ankle Left Port  Result Date: 12/16/2019 CLINICAL DATA:  Status post left ORIF EXAM: PORTABLE LEFT ANKLE - 2 VIEW COMPARISON:  December 09, 2019 FINDINGS: The patient is status post ORIF of a distal fibular fracture. The plate crosses the fracture, affixed with multiple screws. Skin staples are noted. No other abnormalities are identified. IMPRESSION: ORIF of left fibular fracture. Electronically Signed   By: Dorise Bullion III M.D   On: 12/16/2019 16:55    Disposition: Discharge disposition: 01-Home or Self Care       Discharge Instructions    Call MD / Call 911   Complete by: As directed    If you experience chest pain or shortness of breath, CALL 911 and be transported to the hospital emergency room.  If you develope a fever above 101 F, pus (white drainage) or increased drainage or redness at the wound, or calf pain, call your surgeon's office.   Constipation Prevention   Complete by: As directed    Drink plenty of fluids.  Prune juice may be helpful.  You may use a stool softener, such as Colace (over the counter) 100 mg twice a day.  Use MiraLax (over the counter) for constipation as needed.   Diet general   Complete by: As directed    Discharge instructions   Complete by: As directed    Patient will remain NWB on the left lower extremity after discharge.  He was instructed to continue strict elevation of the left lower extremity at home.  Patient will use his crutches to ambulate.   Patient will leave the splint on until follow up and cover it with a plastic bag for showers.  He will follow up with me in the office in 7-10 days.  His appointment is 12/24/19 at 4:15pm.  Patient will take aspirin 325 mg PO daily for DVT prophylaxis while immobilized in a splint or cast.   Driving restrictions   Complete by: As directed    No driving until follow up with Dr. Mack Guise   Increase  activity slowly as tolerated   Complete by: As directed    Lifting restrictions   Complete by: As directed    No lifting for 8-12 weeks         Signed: Thornton Park ,MD 12/17/2019, 1:35 PM

## 2019-12-17 NOTE — Evaluation (Signed)
Occupational Therapy Evaluation Patient Details Name: Casey [name] MRN: [id number] DOB: [birth year] Today's Date: 12/17/2019    History of Present Illness Pt is a 29 y.o. male s/p 12/16/19 ORIF L fibular fx with tightrope fixation of syndesmosis sprain secondary L comminuted fibular fx with sprain of syndesmosis (initial injury 3/2: splinted and provided crutches in ED 3/2).  PMH includes anxiety, htn, h/o eye surgery, panic attack, hand surgery, Ludwig's angina.   Clinical Impression   Patient presents after ORIF on L LE and is NWB.  Previously patient was I with all (B/I)ADLs and functional mobility.  Patient lives with father and his father's girlfriend but also has friends who can assist at time of discharge.  Patient presents with decreased activity tolerance secondary to surgical pain.  Demonstrates good strength and ability to maintain NWB during functional transfers and functional mobility utilizing axillary crutches.  Provided education to patient on use of compensatory techniques and adaptive equipment to safely perform LB dressing and bathing.  Patient verbalized understanding.  Provided education on use of energy conservation techniques within home to improve overall independence.  Patient would benefit from further occupational therapy services to address overall safety awareness, use of DME/AE and compensatory techniques.  Based on today's performance, recommending [place] OT at this time.    Follow Up Recommendations  Home health OT    Equipment Recommendations  Tub/shower seat    Recommendations for Other Services       Precautions / Restrictions Precautions Precautions: Fall Restrictions Weight Bearing Restrictions: Yes LLE Weight Bearing: Non weight bearing Other Position/Activity Restrictions: L LE      Mobility Bed Mobility Overal bed mobility: Modified Independent             General bed mobility comments: Supine with HOB ~20 degrees to sit with extra  time.  Transfers Overall transfer level: Needs assistance Equipment used: None Transfers: Sit to/from Stand Sit to Stand: Supervision;Min guard(SBA/CGA) Stand pivot transfers: Min guard       General transfer comment: DEmonstrates good strength.  Limited activity tolerance secondary to pain.    Balance Overall balance assessment: Needs assistance Sitting-balance support: No upper extremity supported;Feet supported Sitting balance-Leahy Scale: Normal Sitting balance - Comments: steady sitting reaching outside BOS   Standing balance support: Single extremity supported Standing balance-Leahy Scale: Fair Standing balance comment: steady static standing with single UE support                           ADL either performed or assessed with clinical judgement   ADL Overall ADL's : Needs assistance/impaired                     Lower Body Dressing: Minimal assistance;Sitting/lateral leans;Bed level;Cueing for compensatory techniques;Cueing for sequencing Lower Body Dressing Details (indicate cue type and reason): Requires extra time.  Education provided on use of reacher to begin threading. Toilet Transfer: Actuary Details (indicate cue type and reason): cues to maintain NWB Toileting- Clothing Manipulation and Hygiene: Supervision/safety;Sitting/lateral lean       Functional mobility during ADLs: (Per PT, CGA using axillary crutches.) General ADL Comments: Educated patient on use of shower chair or performing bed baths during time of healing.  Educated on use of compensatory techniques to perform LB dressing while seated/bed level.     Vision Baseline Vision/History: No visual deficits Patient Visual Report: No change from baseline       Perception  Praxis      Pertinent Vitals/Pain Pain Assessment: 0-10 Pain Score: 6  Pain Location: L ankle Pain Descriptors / Indicators: Aching;Sore;Tender;Constant;Guarding;Operative  site guarding Pain Intervention(s): Limited activity within patient's tolerance;Monitored during session;Repositioned;Other (comment)(Nurse reports he recieved pain medication at noon through his IV.)     Hand Dominance Right   Extremity/Trunk Assessment Upper Extremity Assessment Upper Extremity Assessment: Overall WFL for tasks assessed   Lower Extremity Assessment Lower Extremity Assessment: Defer to PT evaluation LLE Deficits / Details: Weakness with L LE secondary to pain LLE: Unable to fully assess due to pain;Unable to fully assess due to immobilization   Cervical / Trunk Assessment Cervical / Trunk Assessment: Normal   Communication Communication Communication: No difficulties   Cognition Arousal/Alertness: Awake/alert Behavior During Therapy: WFL for tasks assessed/performed Overall Cognitive Status: Within Functional Limits for tasks assessed                                     General Comments  Elevated L LE at end of session at bed level    Exercises Other Exercises Other Exercises: Provided education to patient on role and goals of OT in acute care setting Other Exercises: Educated patient on use of compensatory techniques to safely perform LB dressing Other Exercises: Educated patient on positioning of L LE to reduce pain while in bed Other Exercises: Educated patient on body mechanics and self pacing to safely perform functional transfers   Shoulder Instructions      Home Living Family/patient expects to be discharged to:: Private residence Living Arrangements: Other relatives(Father and father's girlfriend.  However, patient states he will be leaving to live with a friend.) Available Help at Discharge: Family;Available 24 hours/day;Friend(s) Type of Home: Apartment Home Access: Stairs to enter Entrance Stairs-Number of Steps: 10 Entrance Stairs-Rails: Right;Left;Can reach both Home Layout: One level     Bathroom Shower/Tub: Multimedia programmer: (Elevated)     Home Equipment: Crutches   Additional Comments: Patient would benefit from shower chair or TTB. Patient plans to stay with a friend that lives in a trailer upon d/c.  Pateitn states there are 4 STE with handrailing.  Patient has a tub shower and standard toilet.  Patient states he feels he will have room with his crutches.      Prior Functioning/Environment Level of Independence: Independent        Comments: Patient states he has had family/friends around him for SPV since injury.        OT Problem List: Decreased activity tolerance;Decreased safety awareness;Decreased knowledge of use of DME or AE      OT Treatment/Interventions: Self-care/ADL training;Energy conservation;DME and/or AE instruction;Therapeutic activities;Patient/family education    OT Goals(Current goals can be found in the care plan section) Acute Rehab OT Goals Patient Stated Goal: "Be able to move like I did before" OT Goal Formulation: With patient Time For Goal Achievement: 12/31/19 Potential to Achieve Goals: Good  OT Frequency: Min 1X/week   Barriers to D/C: Other (comment)(Several stairs)          Co-evaluation              AM-PAC OT "6 Clicks" Daily Activity     Outcome Measure Help from another person eating meals?: None Help from another person taking care of personal grooming?: None Help from another person toileting, which includes using toliet, bedpan, or urinal?: A Little Help from another  person bathing (including washing, rinsing, drying)?: A Little Help from another person to put on and taking off regular upper body clothing?: None Help from another person to put on and taking off regular lower body clothing?: A Little 6 Click Score: 21   End of Session Equipment Utilized During Treatment: Gait belt Nurse Communication: Other (comment)(Pain medication administration)  Activity Tolerance: Patient limited by pain Patient left: in bed;with  call bell/phone within reach;with bed alarm set  OT Visit Diagnosis: Unsteadiness on feet (R26.81)                Time: 2025-4270 OT Time Calculation (min): 22 min Charges:  OT General Charges $OT Visit: 1 Visit OT Evaluation $OT Eval Low Complexity: 1 Low OT Treatments $Self Care/Home Management : 8-22 mins  Louanne Belton, MS, OTR/L 12/17/19, 2:21 PM

## 2019-12-17 NOTE — TOC Transition Note (Signed)
Transition of Care [facility]) - CM/SW Discharge Note   Patient Details  Name: Casey [name] MRN: [id number] Date of Birth: 08/05/1991  [facility] CM/SW Contact:  Barrie Dunker, RN Phone Number: 12/17/2019, 1:36 PM   Clinical Narrative:     Patient will DC home today, he has crutches already from when he went to the ED with his injury, no additional needs He has a PCP and can get his medications         Patient Goals and CMS Choice        Discharge Placement                       Discharge Plan and Services                                     Social Determinants of Health (SDOH) Interventions     Readmission Risk Interventions No flowsheet data found.

## 2019-12-17 NOTE — Evaluation (Signed)
Physical Therapy Evaluation Patient Details Name: Casey [name] MRN: [id number] DOB: 1991-06-06 Today's Date: 12/17/2019   History of Present Illness  Pt is a 29 y.o. male s/p 12/16/19 ORIF L fibular fx with tightrope fixation of syndesmosis sprain secondary L comminuted fibular fx with sprain of syndesmosis (initial injury 3/2: splinted and provided crutches in ED 3/2).  PMH includes anxiety, htn, h/o eye surgery, panic attack, hand surgery, Ludwig's angina.  Clinical Impression  Prior to recent ankle injury, pt was independent with functional mobility; lives with his dad and dad's girlfriend; and has had SBA with all functional mobility for safety (since being [place] L LE and using axillary crutches).  Currently pt is modified independent with bed mobility; SBA to CGA with transfers; and CGA ambulating 90 feet with B axillary crutches.  Limited distance ambulating d/t pt's HR elevating from 85 bpm at rest to 150 bpm with activity (pt reports not being in good shape); HR decreased back to 85-86 bpm with rest in bed (nurse notified of pt's elevated HR and present end of session and took pt's vitals).  Pt would benefit from skilled PT to address noted impairments and functional limitations (see below for any additional details).  Upon hospital discharge, pt would benefit from [place].    Follow Up Recommendations Home health PT    Equipment Recommendations  Crutches    Recommendations for Other Services OT consult     Precautions / Restrictions Precautions Precautions: Fall Restrictions Weight Bearing Restrictions: Yes LLE Weight Bearing: Non weight bearing      Mobility  Bed Mobility Overal bed mobility: Modified Independent             General bed mobility comments: Semi-supine to/from sit without any noted difficulties  Transfers Overall transfer level: Needs assistance Equipment used: None Transfers: Sit to/from Stand;Stand Pivot Transfers Sit to Stand: Supervision Stand  pivot transfers: Min guard       General transfer comment: strong stand noted from bed x1 trial and from recliner x1 trial with single UE support; stand hop turn with B axillary crutches recliner to bed  Ambulation/Gait Ambulation/Gait assistance: Min guard Gait Distance (Feet): 90 Feet Assistive device: Crutches   Gait velocity: decreased   General Gait Details: NWB'ing L LE; steady with axillary crutches; decreased step length R LE  Stairs            Wheelchair Mobility    Modified Rankin (Stroke Patients Only)       Balance Overall balance assessment: Needs assistance Sitting-balance support: No upper extremity supported;Feet supported Sitting balance-Leahy Scale: Normal Sitting balance - Comments: steady sitting reaching outside BOS   Standing balance support: Single extremity supported Standing balance-Leahy Scale: Fair Standing balance comment: steady static standing with single UE support                             Pertinent Vitals/Pain Pain Assessment: 0-10 Pain Score: 9  Pain Location: L ankle Pain Descriptors / Indicators: Aching;Sore;Tender;Constant;Guarding;Operative site guarding Pain Intervention(s): Limited activity within patient's tolerance;Monitored during session;Premedicated before session;Repositioned;Patient requesting pain meds-RN notified  O2 sats (on room air) WFL during session's activities    Home Living Family/patient expects to be discharged to:: Private residence Living Arrangements: Other relatives(Pt's dad and dad's girlfriend) Available Help at Discharge: Family;Available 24 hours/day Type of Home: Apartment(2nd floor) Home Access: Stairs to enter Entrance Stairs-Rails: Right;Left;Can reach both Entrance Stairs-Number of Steps: 10 Home Layout: One level Home Equipment:  Crutches      Prior Function Level of Independence: Independent         Comments: Since ankle injury, pt has had SBA with all functional  mobility for safety.     Hand Dominance        Extremity/Trunk Assessment   Upper Extremity Assessment Upper Extremity Assessment: Overall WFL for tasks assessed    Lower Extremity Assessment Lower Extremity Assessment: LLE deficits/detail LLE Deficits / Details: Limited L knee extension d/t discomfort L LE; at least 3/5 hip flexion AROM LLE: Unable to fully assess due to pain;Unable to fully assess due to immobilization    Cervical / Trunk Assessment Cervical / Trunk Assessment: Normal  Communication   Communication: No difficulties  Cognition Arousal/Alertness: Awake/alert Behavior During Therapy: WFL for tasks assessed/performed Overall Cognitive Status: Within Functional Limits for tasks assessed                                        General Comments General comments (skin integrity, edema, etc.): L LE splint and dressings in place.  Nursing cleared pt for participation in physical therapy.  Pt agreeable to PT session.    Exercises     Assessment/Plan    PT Assessment Patient needs continued PT services  PT Problem List Decreased strength;Decreased activity tolerance;Decreased range of motion;Decreased balance;Decreased mobility;Decreased knowledge of use of DME;Decreased knowledge of precautions;Pain;Decreased skin integrity       PT Treatment Interventions DME instruction;Gait training;Stair training;Functional mobility training;Therapeutic activities;Therapeutic exercise;Balance training;Patient/family education    PT Goals (Current goals can be found in the Care Plan section)  Acute Rehab PT Goals Patient Stated Goal: to improve pain control PT Goal Formulation: With patient Time For Goal Achievement: 12/31/19 Potential to Achieve Goals: Fair    Frequency BID   Barriers to discharge        Co-evaluation               AM-PAC PT "6 Clicks" Mobility  Outcome Measure Help needed turning from your back to your side while in a flat  bed without using bedrails?: None Help needed moving from lying on your back to sitting on the side of a flat bed without using bedrails?: None Help needed moving to and from a bed to a chair (including a wheelchair)?: A Little Help needed standing up from a chair using your arms (e.g., wheelchair or bedside chair)?: A Little Help needed to walk in hospital room?: A Little Help needed climbing 3-5 steps with a railing? : A Lot 6 Click Score: 19    End of Session Equipment Utilized During Treatment: Gait belt Activity Tolerance: Patient limited by pain;Other (comment)(Limited d/t HR elevation with activity) Patient left: in bed;with call bell/phone within reach;with bed alarm set;with nursing/sitter in room;with SCD's reapplied;Other (comment)(L LE elevated on pillows above pt's heart) Nurse Communication: Mobility status;Precautions;Weight bearing status;Patient requests pain meds PT Visit Diagnosis: Other abnormalities of gait and mobility (R26.89);Muscle weakness (generalized) (M62.81);Difficulty in walking, not elsewhere classified (R26.2);Pain Pain - Right/Left: Left Pain - part of body: Ankle and joints of foot    Time: 1000-1035 PT Time Calculation (min) (ACUTE ONLY): 35 min   Charges:   PT Evaluation $PT Eval Low Complexity: 1 Low PT Treatments $Therapeutic Activity: 8-22 mins        Hendricks Limes, PT 12/17/19, 1:13 PM

## 2019-12-17 NOTE — Progress Notes (Signed)
D: Pt alert and oriented x 4. Pt denies experiencing any pain at this time.   A: Pt received discharge and medication education/information. Pt belongings were gathered and taken with pt to include one set of crutches.  R: Pt verbalized understanding of discharge and medication education/information.  Pt escorted to [facility] front lobby via wheelchair by staff where pt's friend Crystal picked him up.

## 2019-12-17 NOTE — Progress Notes (Signed)
Subjective:  POD #1 s/p ORIF of left lateral malleolus and stabilization of the syndesmosis.   Patient reports left knee pain as mild to moderate.  Pain has improved overnight.   He states he did well with PT.  He feels that he is ready to go home.  Objective:   VITALS:   Vitals:   12/17/19 0312 12/17/19 0718 12/17/19 1033 12/17/19 1157  BP: 120/76 126/75 (!) 143/96 (!) 136/91  Pulse: 69 71 86 71  Resp: 18 17  17   Temp: (!) 97.4 F (36.3 C) 97.8 F (36.6 C)  98.2 F (36.8 C)  TempSrc: Oral Oral  Oral  SpO2: 100% 100% 96% 98%  Weight:      Height:        PHYSICAL EXAM: Left lower extremity Neurovascular intact Sensation intact distally Intact pulses distally Dorsiflexion/Plantar flexion intact Incision: dressing C/D/I No cellulitis present Compartment soft  LABS  Results for orders placed or performed during the hospital encounter of 12/16/19 (from the past 24 hour(s))  CBC     Status: Abnormal   Collection Time: 12/17/19  4:54 AM  Result Value Ref Range   WBC 13.7 (H) 4.0 - 10.5 K/uL   RBC 4.37 4.22 - 5.81 MIL/uL   Hemoglobin 13.1 13.0 - 17.0 g/dL   HCT 02/16/20 96.2 - 95.2 %   MCV 89.2 80.0 - 100.0 fL   MCH 30.0 26.0 - 34.0 pg   MCHC 33.6 30.0 - 36.0 g/dL   RDW 84.1 32.4 - 40.1 %   Platelets 373 150 - 400 K/uL   nRBC 0.0 0.0 - 0.2 %  Basic metabolic panel     Status: Abnormal   Collection Time: 12/17/19  4:54 AM  Result Value Ref Range   Sodium 139 135 - 145 mmol/L   Potassium 4.2 3.5 - 5.1 mmol/L   Chloride 104 98 - 111 mmol/L   CO2 28 22 - 32 mmol/L   Glucose, Bld 120 (H) 70 - 99 mg/dL   BUN 15 6 - 20 mg/dL   Creatinine, Ser 02/16/20 0.61 - 1.24 mg/dL   Calcium 9.1 8.9 - 2.53 mg/dL   GFR calc non Af Amer >60 >60 mL/min   GFR calc Af Amer >60 >60 mL/min   Anion gap 7 5 - 15    DG Ankle Left Port  Result Date: 12/16/2019 CLINICAL DATA:  Status post left ORIF EXAM: PORTABLE LEFT ANKLE - 2 VIEW COMPARISON:  December 09, 2019 FINDINGS: The patient is status post  ORIF of a distal fibular fracture. The plate crosses the fracture, affixed with multiple screws. Skin staples are noted. No other abnormalities are identified. IMPRESSION: ORIF of left fibular fracture. Electronically Signed   By: December 11, 2019 III M.D   On: 12/16/2019 16:55    Assessment/Plan: 1 Day Post-Op   Active Problems:   Closed left ankle fracture  Patient will remain NWB on the left lower extremity after discharge.  He was instructed to continue strict elevation of the left lower extremity at home.  Patient will use his crutches to ambulate.   Patient will leave the splint on until follow up and cover it with a plastic bag for showers.  He will follow up with me in the office in 7-10 days.  His appointment is 12/24/19 at 4:15pm.  Patient will take aspirin 325 mg PO daily for DVT prophylaxis while immobilized.        12/26/19 , MD 12/17/2019, 1:25 PM

## 2020-03-09 ENCOUNTER — Other Ambulatory Visit: Payer: Self-pay

## 2020-03-09 ENCOUNTER — Encounter: Payer: Self-pay | Admitting: *Deleted

## 2020-03-09 ENCOUNTER — Emergency Department: Payer: Medicaid Other

## 2020-03-09 DIAGNOSIS — Z87891 Personal history of nicotine dependence: Secondary | ICD-10-CM | POA: Insufficient documentation

## 2020-03-09 DIAGNOSIS — Z79899 Other long term (current) drug therapy: Secondary | ICD-10-CM | POA: Diagnosis not present

## 2020-03-09 DIAGNOSIS — R079 Chest pain, unspecified: Secondary | ICD-10-CM | POA: Diagnosis present

## 2020-03-09 DIAGNOSIS — Z7982 Long term (current) use of aspirin: Secondary | ICD-10-CM | POA: Diagnosis not present

## 2020-03-09 DIAGNOSIS — F1910 Other psychoactive substance abuse, uncomplicated: Secondary | ICD-10-CM | POA: Diagnosis not present

## 2020-03-09 DIAGNOSIS — R0789 Other chest pain: Secondary | ICD-10-CM | POA: Diagnosis not present

## 2020-03-09 LAB — URINALYSIS, COMPLETE (UACMP) WITH MICROSCOPIC
Bacteria, UA: NONE SEEN
Bilirubin Urine: NEGATIVE
Glucose, UA: NEGATIVE mg/dL
Hgb urine dipstick: NEGATIVE
Ketones, ur: 5 mg/dL — AB
Leukocytes,Ua: NEGATIVE
Nitrite: NEGATIVE
Protein, ur: NEGATIVE mg/dL
Specific Gravity, Urine: 1.024 (ref 1.005–1.030)
Squamous Epithelial / HPF: NONE SEEN (ref 0–5)
pH: 5 (ref 5.0–8.0)

## 2020-03-09 LAB — BASIC METABOLIC PANEL
Anion gap: 13 (ref 5–15)
BUN: 9 mg/dL (ref 6–20)
CO2: 20 mmol/L — ABNORMAL LOW (ref 22–32)
Calcium: 8.9 mg/dL (ref 8.9–10.3)
Chloride: 104 mmol/L (ref 98–111)
Creatinine, Ser: 1.1 mg/dL (ref 0.61–1.24)
GFR calc Af Amer: >60 mL/min (ref >60)
GFR calc non Af Amer: >60 mL/min (ref >60)
Glucose, Bld: 164 mg/dL — ABNORMAL HIGH (ref 70–99)
Potassium: 3.3 mmol/L — ABNORMAL LOW (ref 3.5–5.1)
Sodium: 137 mmol/L (ref 135–145)

## 2020-03-09 LAB — ETHANOL: Alcohol, Ethyl (B): 16 mg/dL — ABNORMAL HIGH (ref <10)

## 2020-03-09 LAB — URINE DRUG SCREEN, QUALITATIVE (ARMC ONLY)
Amphetamines, Ur Screen: NOT DETECTED
Barbiturates, Ur Screen: NOT DETECTED
Benzodiazepine, Ur Scrn: NOT DETECTED
Cannabinoid 50 Ng, Ur ~~LOC~~: POSITIVE — AB
Cocaine Metabolite,Ur ~~LOC~~: POSITIVE — AB
MDMA (Ecstasy)Ur Screen: NOT DETECTED
Methadone Scn, Ur: NOT DETECTED
Opiate, Ur Screen: NOT DETECTED
Phencyclidine (PCP) Ur S: NOT DETECTED
Tricyclic, Ur Screen: NOT DETECTED

## 2020-03-09 LAB — CBC
HCT: 42.4 % (ref 39.0–52.0)
Hemoglobin: 14.6 g/dL (ref 13.0–17.0)
MCH: 29.4 pg (ref 26.0–34.0)
MCHC: 34.4 g/dL (ref 30.0–36.0)
MCV: 85.5 fL (ref 80.0–100.0)
Platelets: 374 10*3/uL (ref 150–400)
RBC: 4.96 MIL/uL (ref 4.22–5.81)
RDW: 12.9 % (ref 11.5–15.5)
WBC: 8.3 10*3/uL (ref 4.0–10.5)
nRBC: 0 % (ref 0.0–0.2)

## 2020-03-09 LAB — TROPONIN I (HIGH SENSITIVITY)
Troponin I (High Sensitivity): 7 ng/L (ref <18)
Troponin I (High Sensitivity): 7 ng/L (ref <18)

## 2020-03-09 MED ORDER — ONDANSETRON 4 MG PO TBDP
4.0000 mg | ORAL_TABLET | Freq: Once | ORAL | Status: AC
  Administered 2020-03-09: 4 mg via ORAL
  Filled 2020-03-09: qty 1

## 2020-03-09 NOTE — ED Triage Notes (Signed)
Pt to ED reporting centralized chest pain that is reproducible upon palpation. Pain started suddenly while pt was drinking. Pt reports drinking 1 beer day usually but he has had "30 beers" today. PT is unable to give this RN a reason for the increased amount today. Pt also reporting SOB, dizziness, lightheadedness and vomiting. Pt had one episode of vomiting in front of staff in ED. Pt admits to using edibles today as well as the alcohol.   Pt verbalized thinking he was having a seizure upon arrival but was able to talk with staff through the seizure like activity.

## 2020-03-10 ENCOUNTER — Emergency Department
Admission: EM | Admit: 2020-03-10 | Discharge: 2020-03-10 | Disposition: A | Discharge status: Home / Self Care | Payer: Medicaid Other | Attending: Emergency Medicine | Admitting: Emergency Medicine

## 2020-03-10 ENCOUNTER — Encounter: Payer: Self-pay | Admitting: Emergency Medicine

## 2020-03-10 DIAGNOSIS — R0789 Other chest pain: Secondary | ICD-10-CM

## 2020-03-10 DIAGNOSIS — F1910 Other psychoactive substance abuse, uncomplicated: Secondary | ICD-10-CM

## 2020-03-10 HISTORY — DX: Other psychoactive substance abuse, uncomplicated: F19.10

## 2020-03-10 NOTE — Discharge Instructions (Addendum)
Your workup in the Emergency Department today was reassuring.  We did not find any specific abnormalities and believe your symptoms were due to drug use.  We recommend you drink plenty of fluids, take your regular medications and/or any new ones prescribed today, and follow up with the doctor(s) listed in these documents as recommended.  Avoid additional alcohol and drug use, or at least try to decrease the amount of alcohol you drink every day.  If you are used to drinking a lot of alcohol it is generally not a good idea to stop drinking all at once, but rather you should try to decrease the amount over time.  Return to the Emergency Department if you develop new or worsening symptoms that concern you.

## 2020-03-10 NOTE — ED Provider Notes (Signed)
[facility] Emergency Department Provider Note  ____________________________________________   First MD Initiated Contact with Patient 03/10/20 0354     (approximate)  I have reviewed the triage vital signs and the nursing notes.   HISTORY  Chief Complaint Chest Pain    HPI Casey [name] is a 29 y.o. male with a history of polysubstance abuse including alcohol, cocaine, and marijuana.  He presents tonight for evaluation of chest pain.  He said that  he consumes some edibles today as well as drinking a lot of beer, more than usual.  Afterwards he felt some shortness of breath, dizziness, lightheadedness, had some vomiting, and said that he had pain in his chest that felt worse when he pushes on his chest wall.  He had no other trauma or injury.  He said he thought he might have a seizure but he was having no shaking or other symptoms and he told the triage staff that he thought he was having a seizure while he was standing there.  He said that he thought that the edibles he ate were just marijuana but he is not certain.  He denies recent cocaine use although he admits to using cocaine a few weeks ago.  Due to overwhelming hospital and ED volume, he waited for more than 8 hours in the lobby.  He has been sleeping and he says he feels much better, back to normal.  He no longer is having any pain or shortness of breath or other concerning symptoms.  The onset was acute, nothing in particular made it better or worse, and it was severe, but his symptoms have resolved.        Past Medical History:  Diagnosis Date  . Anxiety   . H/O eye surgery   . Hypertension   . Polysubstance abuse [facility])     Patient Active Problem List   Diagnosis Date Noted  . Closed left ankle fracture 12/16/2019  . Panic attack   . Current severe episode of major depressive disorder without psychotic features (Casey [name])   . Cocaine abuse (Casey [name]) 07/27/2019  . Alcohol abuse with  alcohol-induced anxiety disorder (Casey [name]) 07/19/2019  . 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 Alcohol abuse 05/23/2019  . 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 HAND SURGERY    . ORIF ANKLE FRACTURE Left 12/16/2019   Procedure: OPEN REDUCTION INTERNAL FIXATION (ORIF) ANKLE FRACTURE;  Surgeon: Thornton Park, MD;  Location: [facility] ORS;  Service: Orthopedics;  Laterality: Left;    Prior to Admission medications   Medication Sig Start Date End Date Taking? Authorizing Provider  amLODipine (NORVASC) 5 MG tablet Take 1 tablet (5 mg total) by mouth daily. 05/27/19   Clapacs, Madie Reno, MD  aspirin EC 325 MG EC tablet Take 1 tablet (325 mg total) by mouth daily with breakfast. 12/18/19   Thornton Park, MD  docusate sodium (COLACE) 100 MG capsule Take 1 capsule (100 mg total) by mouth 2 (two) times daily. 12/17/19   Thornton Park, MD  oxyCODONE (OXY IR/ROXICODONE) 5 MG immediate release tablet Take 1 tablet (5 mg total) by mouth every 4 (four) hours as needed for moderate pain (pain score 4-6). 12/17/19   Thornton Park, MD    Allergies Patient has no known allergies.  Family History  Problem Relation Age of Onset  . Diabetes Father   . Seizures Father  Social History Social History   Tobacco Use  . Smoking status: Former Smoker    Types: Cigarettes    Quit date: 12/02/2019    Years since quitting: 0.2  . Smokeless tobacco: Never Used  Substance Use Topics  . Alcohol use: Not Currently  . Drug use: Not Currently    Types: Marijuana    Review of Systems Constitutional: No fever/chills Eyes: No visual changes. ENT: No sore throat. Cardiovascular: +chest pain. Respiratory: +shortness of breath. Gastrointestinal: No abdominal pain.  +N/V.  No diarrhea.  No constipation. Genitourinary: Negative for dysuria. Musculoskeletal: Negative for neck pain.   Negative for back pain. Integumentary: Negative for rash. Neurological: Dizziness, lightheadedness. negative for headaches, focal weakness or numbness.   ____________________________________________   PHYSICAL EXAM:  VITAL SIGNS: ED Triage Vitals  Enc Vitals Group     BP 03/09/20 1931 132/80     Pulse Rate 03/09/20 1931 (!) 134     Resp 03/09/20 1931 16     Temp 03/09/20 1931 98.7 F (37.1 C)     Temp Source 03/09/20 1931 Oral     SpO2 03/09/20 1931 94 %     Weight 03/09/20 1937 113.4 kg (250 lb)     Height 03/09/20 1937 1.778 m (5\' 10" )     Head Circumference --      Peak Flow --      Pain Score 03/09/20 1937 10     Pain Loc --      Pain Edu? --      Excl. in GC? --     Constitutional: Alert and oriented.  Eyes: Conjunctivae are normal.  Head: Atraumatic. Nose: No congestion/rhinnorhea. Mouth/Throat: Patient is wearing a mask. Neck: No stridor.  No meningeal signs.   Cardiovascular: Normal rate, regular rhythm. Good peripheral circulation. Grossly normal heart sounds.  No reproducible tenderness to palpation of the anterior chest wall, no crepitus. Respiratory: Normal respiratory effort.  No retractions. Gastrointestinal: Soft and nontender. No distention.  Musculoskeletal: No lower extremity tenderness nor edema. No gross deformities of extremities. Neurologic:  Normal speech and language. No gross focal neurologic deficits are appreciated.  Skin:  Skin is warm, dry and intact. Psychiatric: Mood and affect are normal. Speech and behavior are normal.  ____________________________________________   LABS (all labs ordered are listed, but only abnormal results are displayed)  Labs Reviewed  BASIC METABOLIC PANEL - Abnormal; Notable for the following components:      Result Value   Potassium 3.3 (*)    CO2 20 (*)    Glucose, Bld 164 (*)    All other components within normal limits  ETHANOL - Abnormal; Notable for the following components:   Alcohol, Ethyl (B) 16  (*)    All other components within normal limits  URINALYSIS, COMPLETE (UACMP) WITH MICROSCOPIC - Abnormal; Notable for the following components:   Color, Urine YELLOW (*)    APPearance HAZY (*)    Ketones, ur 5 (*)    All other components within normal limits  URINE DRUG SCREEN, QUALITATIVE ([facility] ONLY) - Abnormal; Notable for the following components:   Cocaine Metabolite,Ur [place] POSITIVE (*)    Cannabinoid 50 Ng, Ur [place] POSITIVE (*)    All other components within normal limits  CBC  TROPONIN I (HIGH SENSITIVITY)  TROPONIN I (HIGH SENSITIVITY)   ____________________________________________  EKG  ED ECG REPORT I, 05/09/20, the attending physician, personally viewed and interpreted this ECG.  Date: 03/09/2020 EKG Time: 19: 32 Rate: 129 Rhythm: Sinus  tachycardia QRS Axis: normal Intervals: normal ST/T Wave abnormalities: Non-specific ST segment / T-wave changes, but no clear evidence of acute ischemia. Narrative Interpretation: no definitive evidence of acute ischemia; does not meet STEMI criteria.   ____________________________________________  RADIOLOGY I, Loleta Rose, personally viewed and evaluated these images (plain radiographs) as part of my medical decision making, as well as reviewing the written report by the radiologist.  ED MD interpretation: No indication of acute abnormality on chest x-ray.  Official radiology report(s): DG Chest 2 View  Result Date: 03/09/2020 CLINICAL DATA:  Chest pain EXAM: CHEST - 2 VIEW COMPARISON:  10/18/2019 FINDINGS: The heart size and mediastinal contours are within normal limits. Both lungs are clear. The visualized skeletal structures are unremarkable. IMPRESSION: No active cardiopulmonary disease. Electronically Signed   By: Deatra Robinson M.D.   On: 03/09/2020 20:37    ____________________________________________   PROCEDURES   Procedure(s) performed (including Critical  Care):  Procedures   ____________________________________________   INITIAL IMPRESSION / MDM / ASSESSMENT AND PLAN / ED COURSE  As part of my medical decision making, I reviewed the following data within the electronic MEDICAL RECORD NUMBER Nursing notes reviewed and incorporated, Labs reviewed , EKG interpreted , Old chart reviewed, Radiograph reviewed , Notes from prior ED visits and [place] Controlled Substance Database   Differential diagnosis includes, but is not limited to, substance abuse, medication/drug side effect, ACS, PE, subcutaneous emphysema, pneumothorax, pneumonia, COVID-19.  The patient was initially tachycardic in triage but that has completely resolved.  EKG unremarkable.  Urinalysis, high-sensitivity troponin x2, and basic metabolic panel are all generally reassuring.  Urine drug screen positive for cocaine and cannabinoids.  Ethanol level is only slightly elevated at 16 and that was about 8 hours ago.  He is currently clinically sober and asymptomatic.  He is appropriate for discharge and outpatient follow-up.  I gave my usual and customary return precautions.           ____________________________________________  FINAL CLINICAL IMPRESSION(S) / ED DIAGNOSES  Final diagnoses:  Atypical chest pain  Polysubstance abuse (HCC)     MEDICATIONS GIVEN DURING THIS VISIT:  Medications  ondansetron (ZOFRAN-ODT) disintegrating tablet 4 mg (4 mg Oral Given 03/09/20 1942)     ED Discharge Orders    None      *Please note:  Casey [name] was evaluated in Emergency Department on 03/10/2020 for the symptoms described in the history of present illness. He was evaluated in the context of the global COVID-19 pandemic, which necessitated consideration that the patient might be at risk for infection with the SARS-CoV-2 virus that causes COVID-19. Institutional protocols and algorithms that pertain to the evaluation of patients at risk for COVID-19 are in a state of rapid change  based on information released by regulatory bodies including the CDC and federal and state organizations. These policies and algorithms were followed during the patient's care in the ED.  Some ED evaluations and interventions may be delayed as a result of limited staffing during the pandemic.*  Note:  This document was prepared using Dragon voice recognition software and may include unintentional dictation errors.   Loleta Rose, MD 03/10/20 [phone]

## 2020-03-28 ENCOUNTER — Emergency Department
Admission: EM | Admit: 2020-03-28 | Discharge: 2020-03-28 | Disposition: A | Discharge status: Home / Self Care | Payer: Medicaid Other | Attending: Emergency Medicine | Admitting: Emergency Medicine

## 2020-03-28 ENCOUNTER — Other Ambulatory Visit: Payer: Self-pay

## 2020-03-28 DIAGNOSIS — E876 Hypokalemia: Secondary | ICD-10-CM

## 2020-03-28 DIAGNOSIS — I10 Essential (primary) hypertension: Secondary | ICD-10-CM | POA: Diagnosis not present

## 2020-03-28 DIAGNOSIS — Y906 Blood alcohol level of 120-199 mg/100 ml: Secondary | ICD-10-CM | POA: Insufficient documentation

## 2020-03-28 DIAGNOSIS — F4320 Adjustment disorder, unspecified: Secondary | ICD-10-CM

## 2020-03-28 DIAGNOSIS — Z7982 Long term (current) use of aspirin: Secondary | ICD-10-CM | POA: Diagnosis not present

## 2020-03-28 DIAGNOSIS — R451 Restlessness and agitation: Secondary | ICD-10-CM | POA: Diagnosis not present

## 2020-03-28 DIAGNOSIS — F10129 Alcohol abuse with intoxication, unspecified: Secondary | ICD-10-CM | POA: Diagnosis present

## 2020-03-28 DIAGNOSIS — Z87891 Personal history of nicotine dependence: Secondary | ICD-10-CM | POA: Insufficient documentation

## 2020-03-28 DIAGNOSIS — Z79899 Other long term (current) drug therapy: Secondary | ICD-10-CM | POA: Insufficient documentation

## 2020-03-28 DIAGNOSIS — F10920 Alcohol use, unspecified with intoxication, uncomplicated: Secondary | ICD-10-CM

## 2020-03-28 LAB — CBC WITH DIFFERENTIAL/PLATELET
Abs Immature Granulocytes: 0.05 10*3/uL (ref 0.00–0.07)
Basophils Absolute: 0.1 10*3/uL (ref 0.0–0.1)
Basophils Relative: 1 %
Eosinophils Absolute: 0 10*3/uL (ref 0.0–0.5)
Eosinophils Relative: 0 %
HCT: 41.8 % (ref 39.0–52.0)
Hemoglobin: 13.9 g/dL (ref 13.0–17.0)
Immature Granulocytes: 1 %
Lymphocytes Relative: 16 %
Lymphs Abs: 1.5 10*3/uL (ref 0.7–4.0)
MCH: 29.1 pg (ref 26.0–34.0)
MCHC: 33.3 g/dL (ref 30.0–36.0)
MCV: 87.4 fL (ref 80.0–100.0)
Monocytes Absolute: 0.5 10*3/uL (ref 0.1–1.0)
Monocytes Relative: 6 %
Neutro Abs: 7.2 10*3/uL (ref 1.7–7.7)
Neutrophils Relative %: 76 %
Platelets: 329 10*3/uL (ref 150–400)
RBC: 4.78 MIL/uL (ref 4.22–5.81)
RDW: 13.4 % (ref 11.5–15.5)
WBC: 9.5 10*3/uL (ref 4.0–10.5)
nRBC: 0 % (ref 0.0–0.2)

## 2020-03-28 LAB — COMPREHENSIVE METABOLIC PANEL
ALT: 14 U/L (ref 0–44)
AST: 24 U/L (ref 15–41)
Albumin: 4.3 g/dL (ref 3.5–5.0)
Alkaline Phosphatase: 83 U/L (ref 38–126)
Anion gap: 16 — ABNORMAL HIGH (ref 5–15)
BUN: 13 mg/dL (ref 6–20)
CO2: 21 mmol/L — ABNORMAL LOW (ref 22–32)
Calcium: 9.2 mg/dL (ref 8.9–10.3)
Chloride: 103 mmol/L (ref 98–111)
Creatinine, Ser: 1.2 mg/dL (ref 0.61–1.24)
GFR calc Af Amer: >60 mL/min (ref >60)
GFR calc non Af Amer: >60 mL/min (ref >60)
Glucose, Bld: 107 mg/dL — ABNORMAL HIGH (ref 70–99)
Potassium: 3 mmol/L — ABNORMAL LOW (ref 3.5–5.1)
Sodium: 140 mmol/L (ref 135–145)
Total Bilirubin: 0.5 mg/dL (ref 0.3–1.2)
Total Protein: 7.3 g/dL (ref 6.5–8.1)

## 2020-03-28 LAB — CK: Total CK: 407 U/L — ABNORMAL HIGH (ref 49–397)

## 2020-03-28 LAB — ACETAMINOPHEN LEVEL: Acetaminophen (Tylenol), Serum: <10 ug/mL — ABNORMAL LOW (ref 10–30)

## 2020-03-28 LAB — SALICYLATE LEVEL: Salicylate Lvl: <7 mg/dL — ABNORMAL LOW (ref 7.0–30.0)

## 2020-03-28 LAB — ETHANOL: Alcohol, Ethyl (B): 181 mg/dL — ABNORMAL HIGH (ref <10)

## 2020-03-28 MED ORDER — LORAZEPAM 2 MG/ML IJ SOLN
2.0000 mg | Freq: Once | INTRAMUSCULAR | Status: AC
  Administered 2020-03-28: 2 mg via INTRAMUSCULAR

## 2020-03-28 MED ORDER — POTASSIUM CHLORIDE CRYS ER 20 MEQ PO TBCR
40.0000 meq | EXTENDED_RELEASE_TABLET | Freq: Once | ORAL | Status: AC
  Administered 2020-03-28: 40 meq via ORAL
  Filled 2020-03-28: qty 2

## 2020-03-28 MED ORDER — DIPHENHYDRAMINE HCL 50 MG/ML IJ SOLN
50.0000 mg | Freq: Once | INTRAMUSCULAR | Status: AC
  Administered 2020-03-28: 50 mg via INTRAMUSCULAR

## 2020-03-28 MED ORDER — HALOPERIDOL LACTATE 5 MG/ML IJ SOLN
5.0000 mg | Freq: Once | INTRAMUSCULAR | Status: AC
  Administered 2020-03-28: 5 mg via INTRAMUSCULAR

## 2020-03-28 NOTE — ED Provider Notes (Signed)
Patient cleared by psychiatry and has contracted for safety. D/c home. Potassium replaced.   Shaune Pollack, MD 03/28/20 [phone]

## 2020-03-28 NOTE — ED Notes (Signed)
Pt is in bed with eyes closed, rep is even and unlabored.

## 2020-03-28 NOTE — Consult Note (Signed)
[facility] Face-to-Face Psychiatry Consult   Reason for Consult:  Post ETOH intoxication post OOC behaviors at home on IVC   Referring Physician:   ED MD  Patient Identification: Casey [name] MRN:  [id number] Principal Diagnosis: <principal problem not specified> Diagnosis:  Active Problems:   * No active hospital problems. *  Post intoxication with OOC issues on IVC but now going home.        Total Time spent with patient:   30 min to forty      Subjective:   Casey [name] is a 29 y.o. male patient admitted with  ETOH intoxication history of major depression now feels clear from the effects of ETOH     HPI:  IVC was taken out by police after patient had argument with his Casey [name].  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  No structure or constructive daily schedule   Has ongoing on and of ETOH binges.  Last pm for example   He has a history of major depression with depressed mood crying spells lack of energy motivation and concentration, lack of sleep without active SI HI or plans   He was on IVC more for OOC behaviors due to ETOh rather than suicide  He also voices anxiety symptoms with excessive worry, nervousness tension frustration, lack of focus ---dread doom feelings and panic like symptoms   He is willing to be go to outpatient psychiatry for medications   His cousin will pick him up today     Past Psychiatric History:  Has been admitted within the last year for depression but cannot recall dates   Risk to Self:  none  Risk to Others:    None Prior Inpatient Therapy:   see above  Prior Outpatient Therapy:   none recently   Past Medical History:  Past Medical History:  Diagnosis Date  . Anxiety   . H/O eye surgery   . Hypertension   . Polysubstance abuse [facility])     Past  Surgical History:  Procedure Laterality Date  . HAND SURGERY    . ORIF ANKLE FRACTURE Left 12/16/2019   Procedure: OPEN REDUCTION INTERNAL FIXATION (ORIF) ANKLE FRACTURE;  Surgeon: Juanell Fairly, MD;  Location: [facility] ORS;  Service: Orthopedics;  Laterality: Left;   Family History:  Parents with history of depression   Family History  Problem Relation Age of Onset  . Diabetes Father   . Seizures Father    Family Psychiatric  History:  See above    Social History:  Social History   Substance and Sexual Activity  Alcohol Use Not Currently     Social History   Substance and Sexual Activity  Drug Use Not Currently  . Types: Marijuana    Social History   Socioeconomic History  . Marital status: Single    Spouse name: Not on file  . Number of children: Not on file  . Years of education: Not on file  . Highest education level: Not on file  Occupational History  . Not on file  Tobacco Use  . Smoking status: Former Smoker    Types: Cigarettes    Quit date: 12/02/2019    Years since quitting: 0.3  . Smokeless tobacco: Never Used  Vaping Use  . Vaping Use: Never used  Substance  and Sexual Activity  . Alcohol use: Not Currently  . Drug use: Not Currently    Types: Marijuana  . Sexual activity: Not on file  Other Topics Concern  . Not on file  Social History Narrative  . Not on file   Social Determinants of Health   Financial Resource Strain:   . Difficulty of Paying Living Expenses:   Food Insecurity:   . Worried About Programme researcher, broadcasting/film/video in the Last Year:   . Barista in the Last Year:   Transportation Needs:   . Freight forwarder (Medical):   Marland Kitchen Lack of Transportation (Non-Medical):   Physical Activity:   . Days of Exercise per Week:   . Minutes of Exercise per Session:   Stress:   . Feeling of Stress :   Social Connections:   . Frequency of Communication with Friends and Family:   . Frequency of Social Gatherings with Friends and Family:   .  Attends Religious Services:   . Active Member of Clubs or Organizations:   . Attends Banker Meetings:   Marland Kitchen Marital Status:    Additional Social History:  None --no major outpatient plan, needs community mental health support     Allergies:  No Known Allergies  Labs:  Results for orders placed or performed during the hospital encounter of 03/28/20 (from the past 48 hour(s))  CBC with Differential     Status: None   Collection Time: 03/28/20  5:02 AM  Result Value Ref Range   WBC 9.5 4.0 - 10.5 K/uL   RBC 4.78 4.22 - 5.81 MIL/uL   Hemoglobin 13.9 13.0 - 17.0 g/dL   HCT 40.9 39 - 52 %   MCV 87.4 80.0 - 100.0 fL   MCH 29.1 26.0 - 34.0 pg   MCHC 33.3 30.0 - 36.0 g/dL   RDW 81.1 91.4 - 78.2 %   Platelets 329 150 - 400 K/uL   nRBC 0.0 0.0 - 0.2 %   Neutrophils Relative % 76 %   Neutro Abs 7.2 1.7 - 7.7 K/uL   Lymphocytes Relative 16 %   Lymphs Abs 1.5 0.7 - 4.0 K/uL   Monocytes Relative 6 %   Monocytes Absolute 0.5 0 - 1 K/uL   Eosinophils Relative 0 %   Eosinophils Absolute 0.0 0 - 0 K/uL   Basophils Relative 1 %   Basophils Absolute 0.1 0 - 0 K/uL   Immature Granulocytes 1 %   Abs Immature Granulocytes 0.05 0.00 - 0.07 K/uL    Comment: Performed at [facility], [address] [place], [place] [address]  Comprehensive metabolic panel     Status: Abnormal   Collection Time: 03/28/20  5:02 AM  Result Value Ref Range   Sodium 140 135 - 145 mmol/L   Potassium 3.0 (L) 3.5 - 5.1 mmol/L   Chloride 103 98 - 111 mmol/L   CO2 21 (L) 22 - 32 mmol/L   Glucose, Bld 107 (H) 70 - 99 mg/dL    Comment: Glucose reference range applies only to samples taken after fasting for at least 8 hours.   BUN 13 6 - 20 mg/dL   Creatinine, Ser 3.08 0.61 - 1.24 mg/dL   Calcium 9.2 8.9 - 65.7 mg/dL   Total Protein 7.3 6.5 - 8.1 g/dL   Albumin 4.3 3.5 - 5.0 g/dL   AST 24 15 - 41 U/L   ALT 14 0 - 44 U/L   Alkaline Phosphatase 83  38 - 126 U/L   Total Bilirubin 0.5 0.3 - 1.2  mg/dL   GFR calc non Af Amer >60 >60 mL/min   GFR calc Af Amer >60 >60 mL/min   Anion gap 16 (H) 5 - 15    Comment: Performed at [facility], [address] [place], [place] [address]  Ethanol     Status: Abnormal   Collection Time: 03/28/20  5:02 AM  Result Value Ref Range   Alcohol, Ethyl (B) 181 (H) <10 mg/dL    Comment: (NOTE) Lowest detectable limit for serum alcohol is 10 mg/dL.  For medical purposes only. Performed at [facility], [address] [place], [place] [address]   Acetaminophen level     Status: Abnormal   Collection Time: 03/28/20  5:02 AM  Result Value Ref Range   Acetaminophen (Tylenol), Serum <10 (L) 10 - 30 ug/mL    Comment: (NOTE) Therapeutic concentrations vary significantly. A range of 10-30 ug/mL  may be an effective concentration for many patients. However, some  are best treated at concentrations outside of this range. Acetaminophen concentrations >150 ug/mL at 4 hours after ingestion  and >50 ug/mL at 12 hours after ingestion are often associated with  toxic reactions.  Performed at [facility], [address] [place], [place] [address]   Salicylate level     Status: Abnormal   Collection Time: 03/28/20  5:02 AM  Result Value Ref Range   Salicylate Lvl <7.0 (L) 7.0 - 30.0 mg/dL    Comment: Performed at [facility], [address] [place], [place] [address]  CK     Status: Abnormal   Collection Time: 03/28/20  5:02 AM  Result Value Ref Range   Total CK 407 (H) 49.0 - 397.0 U/L    Comment: Performed at [facility], [address] [place], [place] [address]    No current facility-administered medications for this encounter.   Current Outpatient Medications  Medication Sig Dispense Refill  . amLODipine (NORVASC) 5 MG tablet Take 1 tablet (5 mg total) by mouth daily. 30 tablet 1  . aspirin EC 325 MG EC tablet Take 1 tablet (325 mg total) by mouth daily with breakfast. 45 tablet 0  .  docusate sodium (COLACE) 100 MG capsule Take 1 capsule (100 mg total) by mouth 2 (two) times daily. 10 capsule 0  . oxyCODONE (OXY IR/ROXICODONE) 5 MG immediate release tablet Take 1 tablet (5 mg total) by mouth every 4 (four) hours as needed for moderate pain (pain score 4-6). 40 tablet 0    Musculoskeletal: Strength & Muscle Tone: normal S/p Right leg fracture with cast   Gait & Station:  Limited due to leg fracture  Patient leans: n/a   Psychiatric Specialty Exam: Physical Exam  Review of Systems  Blood pressure (!) 131/95, pulse 100, temperature 98.3 F (36.8 C), temperature source Oral, resp. rate 18, height 5\' 11"  (1.803 m), weight 115 kg, SpO2 98 %.Body mass index is 35.36 kg/m.     Mental Status   Now alert cooperative oriented times four Appearance --unkept with leg cast on at rest  Rapport normal eye contact normal Concentration and attention normal  Consciousness not clouded or fluctuant No movements problems Speech normal rate tone fluency  Thought process normal Thought content normal  No frank psychosis or mania Memory --remote recent and immediate intact  Mood somewhat depressed Affect somewhat constricted No active SI HI or plans Contracts for safety  Fund of knowledge  and intelligence --below average Judgement insight --limited Reliability fair  Abstraction --somewhat limited due to IDD       Treatment Plan Summary:  Patient being discharged  Contracts for safety at this time   Maudry Diego will pick up from ER   Disposition:  Home with IVC rescinded  Outpatient referrals via TTS   Eulas Post, MD 03/28/2020 4:38 PM

## 2020-03-28 NOTE — Progress Notes (Signed)
Patient ID: Casey [name], male   DOB: 1991-02-10, 29 y.o.   MRN: [id number]   Attempted to see patient times two with TTS  But he is asleep, and not cooperative at this time  Remains on IVC pending eval again  Transferred to the psych er for now.   Smith Robert MD

## 2020-03-28 NOTE — BH Assessment (Signed)
TTS attempted to assess pt with the provider. TTS unable to assess, as patient is sleeping and cannot be aroused enough to participate with the assessment.

## 2020-03-28 NOTE — ED Provider Notes (Signed)
[facility] Emergency Department Provider Note   ____________________________________________   First MD Initiated Contact with Patient 03/28/20 0413     (approximate)  I have reviewed the triage vital signs and the nursing notes.   HISTORY  Chief Complaint Aggressive Behavior  Level V caveat: Limited by combativeness  HPI Casey [name] is a 29 y.o. male brought to the ED by [employer] for combativeness.  Patient with a history of alcohol and polysubstance abuse who is brought in combative, with spit mask on, cursing, kicking and attempting to harm staff.  He spit in the police officers eye.  Rest of history is limited secondary to combativeness.     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 Authorizing Provider  amLODipine (NORVASC) 5 MG tablet Take 1 tablet (5 mg total) by mouth daily. 05/27/19   Clapacs, Jackquline Denmark, MD  aspirin EC 325 MG EC tablet Take 1 tablet (325 mg total) by mouth daily with breakfast. 12/18/19   Juanell Fairly, MD  docusate  sodium (COLACE) 100 MG capsule Take 1 capsule (100 mg total) by mouth 2 (two) times daily. 12/17/19   Juanell Fairly, MD  oxyCODONE (OXY IR/ROXICODONE) 5 MG immediate release tablet Take 1 tablet (5 mg total) by mouth every 4 (four) hours as needed for moderate pain (pain score 4-6). 12/17/19   Juanell Fairly, MD    Allergies Patient has no known allergies.  Family History  Problem Relation Age of Onset   Diabetes Father    Seizures Father     Social History Social History   Tobacco Use   Smoking status: Former Smoker    Types: Cigarettes    Quit date: 12/02/2019    Years since quitting: 0.3   Smokeless tobacco: Never Used  Vaping Use   Vaping Use: Never used  Substance Use Topics   Alcohol use: Not Currently   Drug use: Not Currently    Types: Marijuana    Review of Systems  Constitutional: No fever/chills Eyes: No visual changes. ENT: No sore throat. Cardiovascular: Denies chest pain. Respiratory: Denies shortness of breath. Gastrointestinal: No abdominal pain.  No nausea, no vomiting.  No diarrhea.  No constipation. Genitourinary: Negative for dysuria. Musculoskeletal: Negative for back pain. Skin: Negative for rash. Neurological: Negative for headaches, focal weakness or numbness. Psychiatric:  Positive for combativeness.  ____________________________________________   PHYSICAL EXAM:  VITAL SIGNS: ED Triage Vitals  Enc Vitals Group     BP      Pulse      Resp  Temp      Temp src      SpO2      Weight      Height      Head Circumference      Peak Flow      Pain Score      Pain Loc      Pain Edu?      Excl. in GC?     Constitutional: Alert and oriented.  Disheveled appearing and in moderate acute distress. Yelling, kicking. Eyes: Conjunctivae are normal. PERRL. EOMI. Head: Atraumatic. Nose: No congestion/rhinnorhea. Mouth/Throat: Mucous membranes are moist.   Neck: No stridor.   Cardiovascular: Normal rate, regular rhythm.  Grossly normal heart sounds.  Good peripheral circulation. Respiratory: Normal respiratory effort.  No retractions. Lungs CTAB. Gastrointestinal: Soft and nontender. No distention. No abdominal bruits. No CVA tenderness. Musculoskeletal: No lower extremity tenderness nor edema.  No joint effusions. Neurologic:  Normal speech and language. No gross focal neurologic deficits are appreciated.  Skin:  Skin is warm, dry and intact. No rash noted. Psychiatric: Mood and affect are combative. Speech and behavior are combative.  ____________________________________________   LABS (all labs ordered are listed, but only abnormal results are displayed)  Labs Reviewed  COMPREHENSIVE METABOLIC PANEL - Abnormal; Notable for the following components:      Result Value   Potassium 3.0 (*)    CO2 21 (*)    Glucose, Bld 107 (*)    Anion gap 16 (*)    All other components within normal limits  ETHANOL - Abnormal; Notable for the following components:   Alcohol, Ethyl (B) 181 (*)    All other components within normal limits  ACETAMINOPHEN LEVEL - Abnormal; Notable for the following components:   Acetaminophen (Tylenol), Serum <10 (*)    All other components within normal limits  SALICYLATE LEVEL - Abnormal; Notable for the following components:   Salicylate Lvl <7.0 (*)    All other components within normal limits  CK - Abnormal; Notable for the following components:   Total CK 407 (*)    All other components within normal limits  CBC WITH DIFFERENTIAL/PLATELET  URINE DRUG SCREEN, QUALITATIVE ([facility] ONLY)   ____________________________________________  EKG  None ____________________________________________  RADIOLOGY  ED MD interpretation: None  Official radiology report(s): No results found.  ____________________________________________   PROCEDURES  Procedure(s) performed (including Critical Care):  Procedures   ____________________________________________   INITIAL  IMPRESSION / ASSESSMENT AND PLAN / ED COURSE  As part of my medical decision making, I reviewed the following data within the electronic MEDICAL RECORD NUMBER Nursing notes reviewed and incorporated, Labs reviewed, Old chart reviewed, A consult was requested and obtained from this/these consultant(s) Psychiatry and Notes from prior ED visits     Jerime Arif was evaluated in Emergency Department on 03/28/2020 for the symptoms described in the history of present illness. He was evaluated in the context of the global COVID-19 pandemic, which necessitated consideration that the patient might be at risk for infection with the SARS-CoV-2 virus that causes COVID-19. Institutional protocols and algorithms that pertain to the evaluation of patients at risk for COVID-19 are in a state of rapid change based on information released by regulatory bodies including the CDC and federal and state organizations. These policies and algorithms were followed during the patient's care in the ED.    29 year old male with history of alcohol and polysubstance abuse presenting with combativeness.   Clinical Course as of Mar 28 548  Sun Mar 28, 2020  2229 Patient sleeping soundly after IM calming agents.  Will need psychiatry evaluation after he is awake and able to participate   [JS]  0549 Laboratory results noted.  Will administer oral potassium once patient is awake. The patient has been placed in psychiatric observation due to the need to provide a safe environment for the patient while obtaining psychiatric consultation and evaluation, as well as ongoing medical and medication management to treat the patient's condition. The patient has been placed under full IVC at this time.     [JS]    Clinical Course User Index [JS] Paulette Blanch, MD     ____________________________________________   FINAL CLINICAL IMPRESSION(S) / ED DIAGNOSES  Final diagnoses:  Alcoholic intoxication without complication ([place])    Hypokalemia  Adjustment disorder, unspecified type     ED Discharge Orders    None       Note:  This document was prepared using Dragon voice recognition software and may include unintentional dictation errors.   Paulette Blanch, MD 03/28/20 [phone]

## 2020-03-28 NOTE — BH Assessment (Signed)
Patient given sedative medication at approximately 440 and is unable to be assessed at this time.

## 2020-03-28 NOTE — ED Notes (Signed)
Pt was agitated, combative, physically and verbally aggressive to staff and threatening others. Pt was given emergency med see [facility]

## 2020-03-28 NOTE — ED Notes (Signed)
Meal tray given 

## 2020-03-28 NOTE — ED Triage Notes (Signed)
Pt [employer] officer, with aggressive behavior.

## 2020-03-28 NOTE — BH Assessment (Signed)
TTS checked in with pt's current RN. RN reports pt to be sleeping and agreed to contact TTS when pt awakes and is able to be assessed.  

## 2020-03-28 NOTE — BH Assessment (Signed)
Assessment Note  Casey [name] is an 29 y.o. male who presents to [facility] ED involuntarily for treatments. Per triage note, Pt was agitated, combative, physically and verbally aggressive to staff and threatening others. Pt was given emergency med.  During TTS assessment pt is alert and oriented x 3, anxious but cooperative, and mood-congruent with affect. Pt does not appear to be responding to internal or external stimuli. Neither is the pt presenting with any delusional thinking. Pt reports to be unaware of all that occurred last night due to alcohol intoxication. Pt reports remembering getting upset with dad resulting to him flipping over a table but was unable to recall what made him upset. Pt denies a MH hx or to be currently taking any medications. Pt reports an INPT hx with [facility] a couple months ago for SA and denied any previous or current OPT hx. Pt reports a hx of SA and identified alcohol as his primary choice. Pt denied any other SA. Pt reports to live with dad and a family hx of MH/SA for both parents but was unable to provide specifics. Pt expressed no interest in SA treatment and requested to go home. Pt denied any SI/HI/AH/VH and provided his father Waymire [phone]) as a collateral contact.   TTS attempted to contact [place] and received his voicemail but was unable to leave a message.   Per Dr. Janese Banks pt does not meet criteria for INPT  Diagnosis: per hx Alcohol Intoxication   Past Medical History:  Past Medical History:  Diagnosis Date  . Anxiety   . H/O eye surgery   . Hypertension   . Polysubstance abuse [facility])     Past Surgical History:  Procedure Laterality Date  . HAND SURGERY    . ORIF ANKLE FRACTURE Left 12/16/2019   Procedure: OPEN REDUCTION INTERNAL FIXATION (ORIF) ANKLE FRACTURE;  Surgeon: Thornton Park, MD;  Location: [facility] ORS;  Service: Orthopedics;  Laterality: Left;    Family History:  Family History  Problem Relation Age of Onset  . Diabetes Father   .  Seizures Father     Social History:  reports that he quit smoking about 3 months ago. His smoking use included cigarettes. He has never used smokeless tobacco. He reports previous alcohol use. He reports previous drug use. Drug: Marijuana.  Additional Social History:  Alcohol / Drug Use Pain Medications: see mar Prescriptions: see mar Over the Counter: see mar History of alcohol / drug use?: Yes Substance #1 Name of Substance 1: Alcohol  CIWA: CIWA-Ar BP: (!) 131/95 Pulse Rate: 100 COWS:    Allergies: No Known Allergies  Home Medications: (Not in a hospital admission)   OB/GYN Status:  No LMP for male patient.  General Assessment Data Location of Assessment: [facility] ED TTS Assessment: In system Is this a Tele or Face-to-Face Assessment?: Face-to-Face Is this an Initial Assessment or a Re-assessment for this encounter?: Initial Assessment Patient Accompanied by:: N/A Language Other than English: No Living Arrangements: Other (Comment) (private home) What gender do you identify as?: Male Marital status: Single Maiden name: n/a Pregnancy Status: No Living Arrangements: Parent (Dad) Can pt return to current living arrangement?: Yes Admission Status: Involuntary Petitioner: Police Is patient capable of signing voluntary admission?: Yes Referral Source: Other Insurance type: Medicaid   Medical Screening Exam ([place]) Medical Exam completed: Yes  Crisis Care Plan Living Arrangements: Parent (Dad) Name of Psychiatrist: None reported  Name of Therapist: None reported   Education Status Is patient currently in school?: No  Is the patient employed, unemployed or receiving disability?: Unemployed, Receiving disability income  Risk to self with the past 6 months Suicidal Ideation: No Has patient been a risk to self within the past 6 months prior to admission? : No Suicidal Intent: No Has patient had any suicidal intent within the past 6 months prior to admission?  : No Has patient had any suicidal plan within the past 6 months prior to admission? : Other (comment) (None reported ) Access to Means: No What has been your use of drugs/alcohol within the last 12 months?: alcohol Previous Attempts/Gestures: No How many times?: 0 Other Self Harm Risks: None reported  Triggers for Past Attempts: Unknown Intentional Self Injurious Behavior: None Family Suicide History: No Recent stressful life event(s): Other (Comment) (None reported ) Persecutory voices/beliefs?: No Depression: Yes Depression Symptoms: Feeling angry/irritable Substance abuse history and/or treatment for substance abuse?: Yes Suicide prevention information given to non-admitted patients: Yes  Risk to Others within the past 6 months Homicidal Ideation: No Does patient have any lifetime risk of violence toward others beyond the six months prior to admission? : No Thoughts of Harm to Others: No Current Homicidal Intent: No Current Homicidal Plan: No Access to Homicidal Means: No Identified Victim: n/a History of harm to others?: No Assessment of Violence: On admission Violent Behavior Description: aggression  Does patient have access to weapons?: No Criminal Charges Pending?:  (Pt reports to be unsure ) Does patient have a court date:  (Pt reports to be unsure ) Is patient on probation?: Unknown  Psychosis Hallucinations: None noted Delusions: None noted  Mental Status Report Appearance/Hygiene: In scrubs Eye Contact: Good Motor Activity: Freedom of movement Speech: Logical/coherent Level of Consciousness: Alert Mood: Depressed, Anxious, Pleasant Affect: Anxious, Depressed, Flat Anxiety Level: Minimal Thought Processes: Coherent, Relevant Judgement: Partial Orientation: Appropriate for developmental age Obsessive Compulsive Thoughts/Behaviors: None  Cognitive Functioning Concentration: Good Memory: Recent Intact, Remote Intact Is patient IDD:  (Pt reports learning  disability) Insight: Poor Impulse Control: Fair Appetite: Good Have you had any weight changes? : No Change Sleep: No Change Total Hours of Sleep:  (Pt reports to be unsure ) Vegetative Symptoms: None     Prior Inpatient Therapy Prior Inpatient Therapy: Yes Prior Therapy Dates: 10/18/19 Prior Therapy Facilty/Provider(s): [facility] Reason for Treatment: SA  Prior Outpatient Therapy Prior Outpatient Therapy: No Does patient have an ACCT team?: No Does patient have Intensive In-House Services?  : No Does patient have [facility] services? : No Does patient have P4CC services?: Unknown  ADL Screening (condition at time of admission) Is the patient deaf or have difficulty hearing?: No Does the patient have difficulty seeing, even when wearing glasses/contacts?: No Does the patient have difficulty concentrating, remembering, or making decisions?: No Does the patient have difficulty dressing or bathing?: No Does the patient have difficulty walking or climbing stairs?: No Weakness of Legs: None Weakness of Arms/Hands: None  Home Assistive Devices/Equipment Home Assistive Devices/Equipment: None  Therapy Consults (therapy consults require a physician order) PT Evaluation Needed: No OT Evalulation Needed: No SLP Evaluation Needed: No Abuse/Neglect Assessment (Assessment to be complete while patient is alone) Abuse/Neglect Assessment Can Be Completed: Yes Physical Abuse: Denies Verbal Abuse: Denies Sexual Abuse: Denies Exploitation of patient/patient's resources: Denies Self-Neglect: Denies Values / Beliefs Cultural Requests During Hospitalization: None Spiritual Requests During Hospitalization: None Consults Spiritual Care Consult Needed: No Transition of Care Team Consult Needed: No Advance Directives (For Healthcare) Does Patient Have a Medical Advance Directive?: (S)  (UTA) Would  patient like information on creating a medical advance directive?:  (UTA)           Disposition:  Disposition Initial Assessment Completed for this Encounter: Yes Patient referred to: Other (Comment)  On Site Evaluation by:   Reviewed with Physician:    Opal Sidles 03/28/2020 6:38 PM

## 2020-03-28 NOTE — ED Notes (Signed)
This RN at bedside to collect blood specimens for [employer] exposure kit. This RN was not provided with [employer] officer name who was exposed by pt from prior RN. Form filled out as much as possible with pt's information. Copy of paperwork placed in pt's chart. Blood specimens sent to lab at this time.

## 2020-08-10 ENCOUNTER — Emergency Department
Admission: EM | Admit: 2020-08-10 | Discharge: 2020-08-10 | Disposition: A | Discharge status: Home / Self Care | Payer: Medicaid Other | Attending: Emergency Medicine | Admitting: Emergency Medicine

## 2020-08-10 ENCOUNTER — Encounter: Payer: Self-pay | Admitting: Emergency Medicine

## 2020-08-10 ENCOUNTER — Other Ambulatory Visit: Payer: Self-pay

## 2020-08-10 DIAGNOSIS — Z87891 Personal history of nicotine dependence: Secondary | ICD-10-CM | POA: Insufficient documentation

## 2020-08-10 DIAGNOSIS — I10 Essential (primary) hypertension: Secondary | ICD-10-CM | POA: Diagnosis not present

## 2020-08-10 DIAGNOSIS — J029 Acute pharyngitis, unspecified: Secondary | ICD-10-CM | POA: Insufficient documentation

## 2020-08-10 DIAGNOSIS — R070 Pain in throat: Secondary | ICD-10-CM | POA: Diagnosis present

## 2020-08-10 LAB — GROUP A STREP BY PCR: Group A Strep by PCR: NOT DETECTED

## 2020-08-10 MED ORDER — LIDOCAINE VISCOUS HCL 2 % MT SOLN
10.0000 mL | OROMUCOSAL | 0 refills | Status: DC | PRN
Start: 2020-08-10 — End: 2021-02-08

## 2020-08-10 MED ORDER — CETIRIZINE HCL 10 MG PO TABS
10.0000 mg | ORAL_TABLET | Freq: Every day | ORAL | 0 refills | Status: DC
Start: 2020-08-10 — End: 2021-02-08

## 2020-08-10 NOTE — ED Triage Notes (Signed)
Patient to ER for c/o sore throat. Denies any known fevers.

## 2020-08-10 NOTE — ED Provider Notes (Signed)
[facility] Emergency Department Provider Note  ____________________________________________   First MD Initiated Contact with Patient 08/10/20 1310     (approximate)  I have reviewed the triage vital signs and the nursing notes.   HISTORY  Chief Complaint Sore Throat   HPI Casey [name] is a 29 y.o. male presents to the ED with complaint of sore throat but denies any fever, chills, cough, nasal congestion or change in taste or smell.  He denies any known exposure to Covid or strep throat.  He has continued to drink fluids without any difficulty.  He rates his pain as a 10/10.     Past Medical History:  Diagnosis Date  . Anxiety   . H/O eye surgery   . Hypertension   . Polysubstance abuse [facility])     Patient Active Problem List   Diagnosis Date Noted  . Closed left ankle fracture 12/16/2019  . Panic attack   . Current severe episode of major depressive disorder without psychotic features (HCC)   . Cocaine abuse (HCC) 07/27/2019  . 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 Alcohol abuse 05/23/2019  . 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 HAND SURGERY    . ORIF ANKLE FRACTURE Left 12/16/2019   Procedure: OPEN REDUCTION INTERNAL FIXATION (ORIF) ANKLE FRACTURE;  Surgeon: Juanell Fairly, MD;  Location: [facility] ORS;  Service: Orthopedics;  Laterality: Left;    Prior to Admission medications   Medication Sig Start Date End Date Taking? Authorizing Provider  amLODipine (NORVASC) 5 MG tablet Take 1 tablet (5 mg total) by mouth daily. 05/27/19   Clapacs, Jackquline Denmark, MD  aspirin EC 325 MG EC tablet Take 1 tablet (325 mg total) by mouth daily with breakfast. 12/18/19   Juanell Fairly, MD  cetirizine (ZYRTEC ALLERGY) 10 MG tablet Take 1 tablet (10 mg total) by mouth  daily. 08/10/20 08/10/21  Tommi Rumps, PA-C  docusate sodium (COLACE) 100 MG capsule Take 1 capsule (100 mg total) by mouth 2 (two) times daily. 12/17/19   Juanell Fairly, MD  lidocaine (XYLOCAINE) 2 % solution Use as directed 10 mLs in the mouth or throat every 4 (four) hours as needed for mouth pain. Swish in mouth and spit 08/10/20   Bridget Hartshorn L, PA-C  oxyCODONE (OXY IR/ROXICODONE) 5 MG immediate release tablet Take 1 tablet (5 mg total) by mouth every 4 (four) hours as needed for moderate pain (pain score 4-6). 12/17/19   Juanell Fairly, MD    Allergies Patient has no known allergies.  Family History  Problem Relation Age of Onset  . Diabetes Father   . Seizures Father     Social History Social History   Tobacco Use  . Smoking status: Former Smoker    Types: Cigarettes    Quit date: 12/02/2019    Years since quitting: 0.6  . Smokeless tobacco: Never Used  Vaping Use  . Vaping Use: Never used  Substance Use Topics  . Alcohol use: Not Currently  . Drug use: Not Currently    Types: Marijuana    Review of Systems Constitutional: No fever/chills Eyes: No visual changes. ENT: Positive for sore throat. Cardiovascular: Denies chest pain. Respiratory: Denies shortness of breath.  Negative for cough. Gastrointestinal: No abdominal pain.  No nausea, no vomiting.  No diarrhea.  Musculoskeletal: Negative for muscle skeletal  pain. Skin: Negative for rash. Neurological: Negative for headaches, focal weakness or numbness. ____________________________________________   PHYSICAL EXAM:  VITAL SIGNS: ED Triage Vitals  Enc Vitals Group     BP 08/10/20 1258 137/89     Pulse Rate 08/10/20 1258 (!) 104     Resp 08/10/20 1258 20     Temp 08/10/20 1258 98.3 F (36.8 C)     Temp Source 08/10/20 1258 Oral     SpO2 08/10/20 1258 96 %     Weight 08/10/20 1259 253 lb 8.5 oz (115 kg)     Height 08/10/20 1259 5\' 11"  (1.803 m)     Head Circumference --      Peak Flow --       Pain Score 08/10/20 1259 10     Pain Loc --      Pain Edu? --      Excl. in GC? --     Constitutional: Alert and oriented. Well appearing and in no acute distress. Eyes: Conjunctivae are normal. PERRL. EOMI. Head: Atraumatic. Nose: No congestion/rhinnorhea. Mouth/Throat: Mucous membranes are moist.  Oropharynx non-erythematous.  Mild posterior drainage is noted on exam. Neck: No stridor.  No cervical adenopathy is noted on exam. Cardiovascular: Normal rate, regular rhythm. Grossly normal heart sounds.  Good peripheral circulation. Respiratory: Normal respiratory effort.  No retractions. Lungs CTAB. Musculoskeletal: Moves upper and lower extremities any difficulty.  Normal gait was noted. Neurologic:  Normal speech and language. No gross focal neurologic deficits are appreciated.  Skin:  Skin is warm, dry and intact. No rash noted. Psychiatric: Mood and affect are normal. Speech and behavior are normal.  ____________________________________________   LABS (all labs ordered are listed, but only abnormal results are displayed)  Labs Reviewed  GROUP A STREP BY PCR     PROCEDURES  Procedure(s) performed (including Critical Care):  Procedures  ____________________________________________   INITIAL IMPRESSION / ASSESSMENT AND PLAN / ED COURSE  As part of my medical decision making, I reviewed the following data within the electronic MEDICAL RECORD NUMBER Notes from prior ED visits and [place] Controlled Substance Database  29 year old male presents to the ED with complaint of sore throat for 1 day without known fever, chills, nausea or vomiting.  He denies any known exposure to Covid or strep throat.  Physical exam is benign.  Strep test was negative.  Patient was placed and viscous lidocaine to swish and spit for his sore throat and also started on Zyrtec 10 mg 1 daily.  He is to follow-up with his PCP or Dr. 26 who is on-call for [place] if any continued  problems. ____________________________________________   FINAL CLINICAL IMPRESSION(S) / ED DIAGNOSES  Final diagnoses:  Viral pharyngitis     ED Discharge Orders         Ordered    lidocaine (XYLOCAINE) 2 % solution  Every 4 hours PRN        08/10/20 1627    cetirizine (ZYRTEC ALLERGY) 10 MG tablet  Daily        08/10/20 1627          *Please note:  Casey [name] was evaluated in Emergency Department on 08/10/2020 for the symptoms described in the history of present illness. He was evaluated in the context of the global COVID-19 pandemic, which necessitated consideration that the patient might be at risk for infection with the SARS-CoV-2 virus that causes COVID-19. Institutional protocols and algorithms that pertain to the evaluation of patients at risk for COVID-19 are  in a state of rapid change based on information released by regulatory bodies including the CDC and federal and state organizations. These policies and algorithms were followed during the patient's care in the ED.  Some ED evaluations and interventions may be delayed as a result of limited staffing during and the pandemic.*   Note:  This document was prepared using Dragon voice recognition software and may include unintentional dictation errors.    Tommi Rumps, PA-C 08/10/20 1635    Delton [place], MD 08/11/20 [phone]

## 2020-08-10 NOTE — Discharge Instructions (Signed)
Follow-up with your primary care provider or Dr. Willeen Cass who is on-call for [place] [facility] if any continued problems or worsening of your symptoms.  Begin taking the medication that was sent to your pharmacy.  The lidocaine 2% solution is to swish around in your mouth and spit out which should help with some of the throat pain.  Also to control some of the posterior drainage noted the cetirizine 10 mg is once a day.  This medication will not cause any drowsiness.  You may also take Tylenol or ibuprofen to help with throat pain as well.

## 2020-09-28 ENCOUNTER — Emergency Department
Admission: EM | Admit: 2020-09-28 | Discharge: 2020-09-28 | Disposition: A | Discharge status: Home / Self Care | Payer: Medicaid Other | Attending: Emergency Medicine | Admitting: Emergency Medicine

## 2020-09-28 ENCOUNTER — Other Ambulatory Visit: Payer: Self-pay

## 2020-09-28 ENCOUNTER — Emergency Department: Payer: Medicaid Other

## 2020-09-28 DIAGNOSIS — Z87891 Personal history of nicotine dependence: Secondary | ICD-10-CM | POA: Insufficient documentation

## 2020-09-28 DIAGNOSIS — S0181XA Laceration without foreign body of other part of head, initial encounter: Secondary | ICD-10-CM | POA: Diagnosis not present

## 2020-09-28 DIAGNOSIS — I10 Essential (primary) hypertension: Secondary | ICD-10-CM | POA: Insufficient documentation

## 2020-09-28 DIAGNOSIS — S01111A Laceration without foreign body of right eyelid and periocular area, initial encounter: Secondary | ICD-10-CM | POA: Insufficient documentation

## 2020-09-28 DIAGNOSIS — W109XXA Fall (on) (from) unspecified stairs and steps, initial encounter: Secondary | ICD-10-CM | POA: Diagnosis not present

## 2020-09-28 DIAGNOSIS — S01511A Laceration without foreign body of lip, initial encounter: Secondary | ICD-10-CM | POA: Insufficient documentation

## 2020-09-28 DIAGNOSIS — Z7982 Long term (current) use of aspirin: Secondary | ICD-10-CM | POA: Insufficient documentation

## 2020-09-28 DIAGNOSIS — Z79899 Other long term (current) drug therapy: Secondary | ICD-10-CM | POA: Insufficient documentation

## 2020-09-28 DIAGNOSIS — S0990XA Unspecified injury of head, initial encounter: Secondary | ICD-10-CM | POA: Diagnosis present

## 2020-09-28 DIAGNOSIS — S0083XA Contusion of other part of head, initial encounter: Secondary | ICD-10-CM

## 2020-09-28 MED ORDER — LIDOCAINE-EPINEPHRINE (PF) 2 %-1:200000 IJ SOLN
20.0000 mL | Freq: Once | INTRAMUSCULAR | Status: DC
  Filled 2020-09-28: qty 20
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
## 2021-01-08 IMAGING — DX DG CHEST 1V PORT
1 series · 2 of 2 positions shown · non-contrast
Comparison: 08/14/2019

CLINICAL DATA: Anxiety, chest pain

EXAM:
PORTABLE CHEST 1 VIEW

[Series 1: chest ap · 0.14mm/px · 2 of 2 slices shown]
[im 1/2]
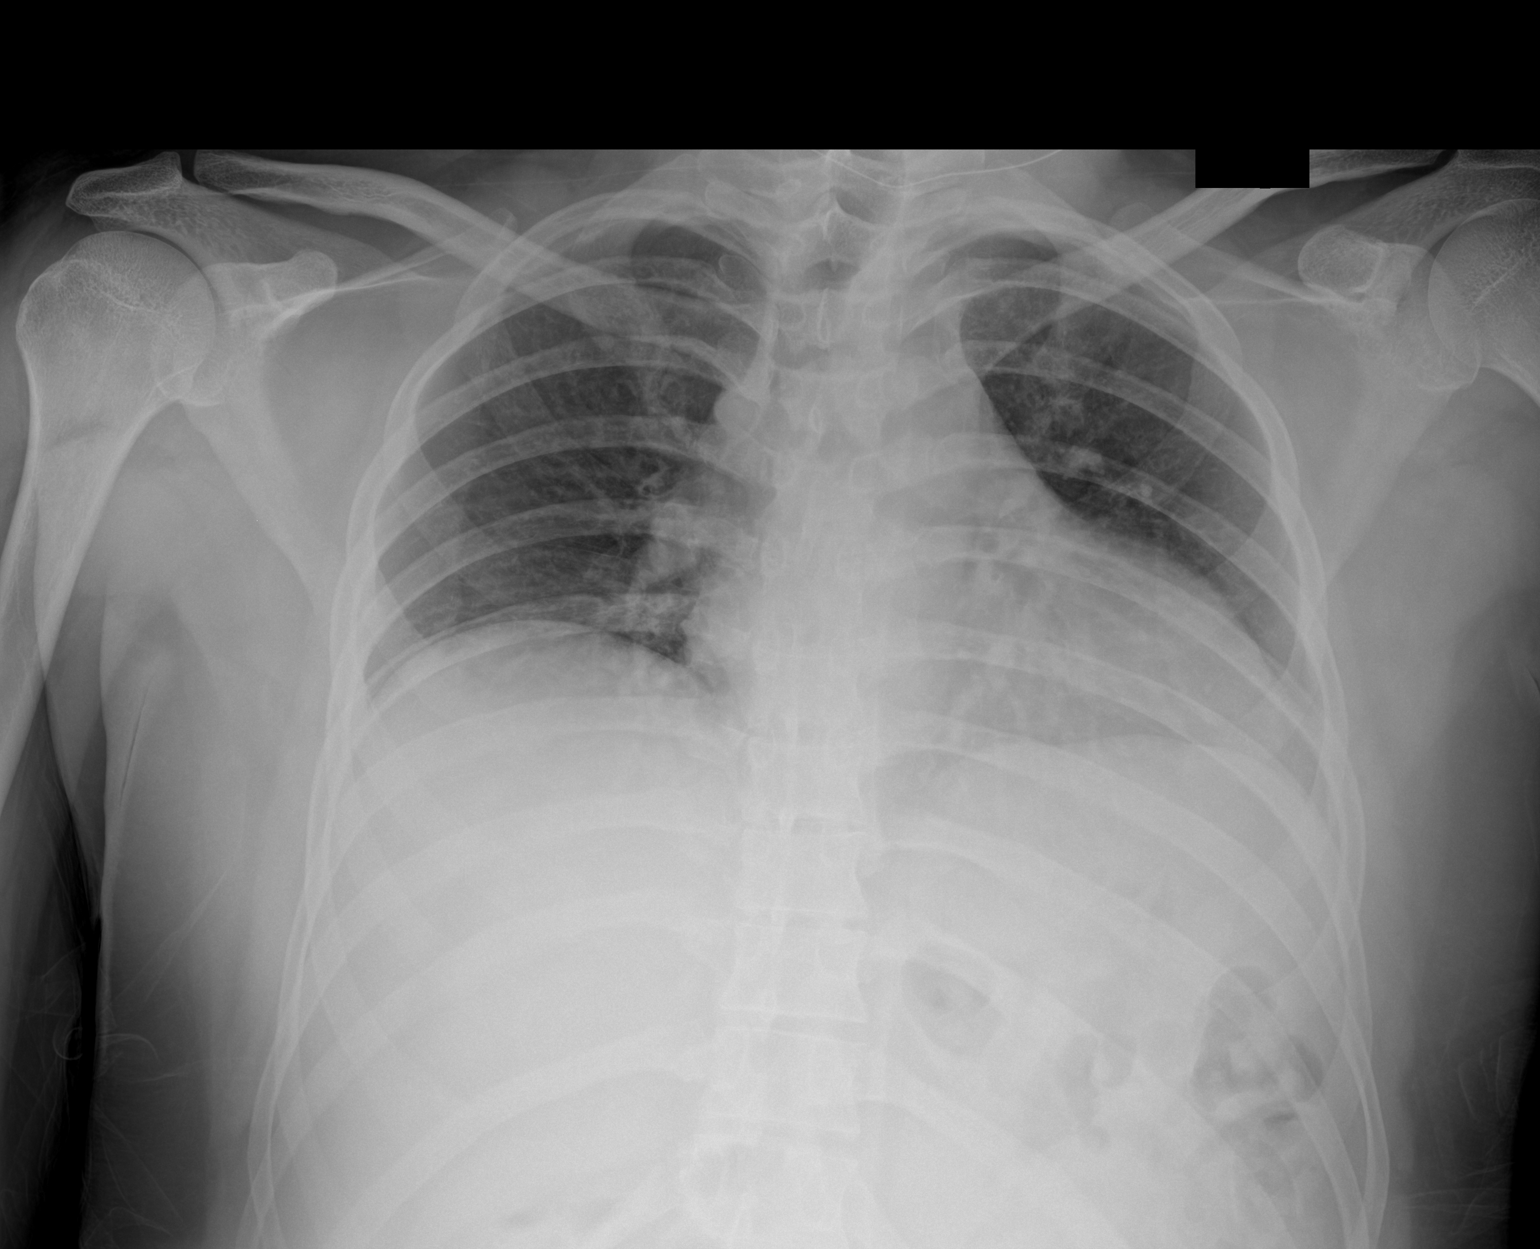
[im 2/2]
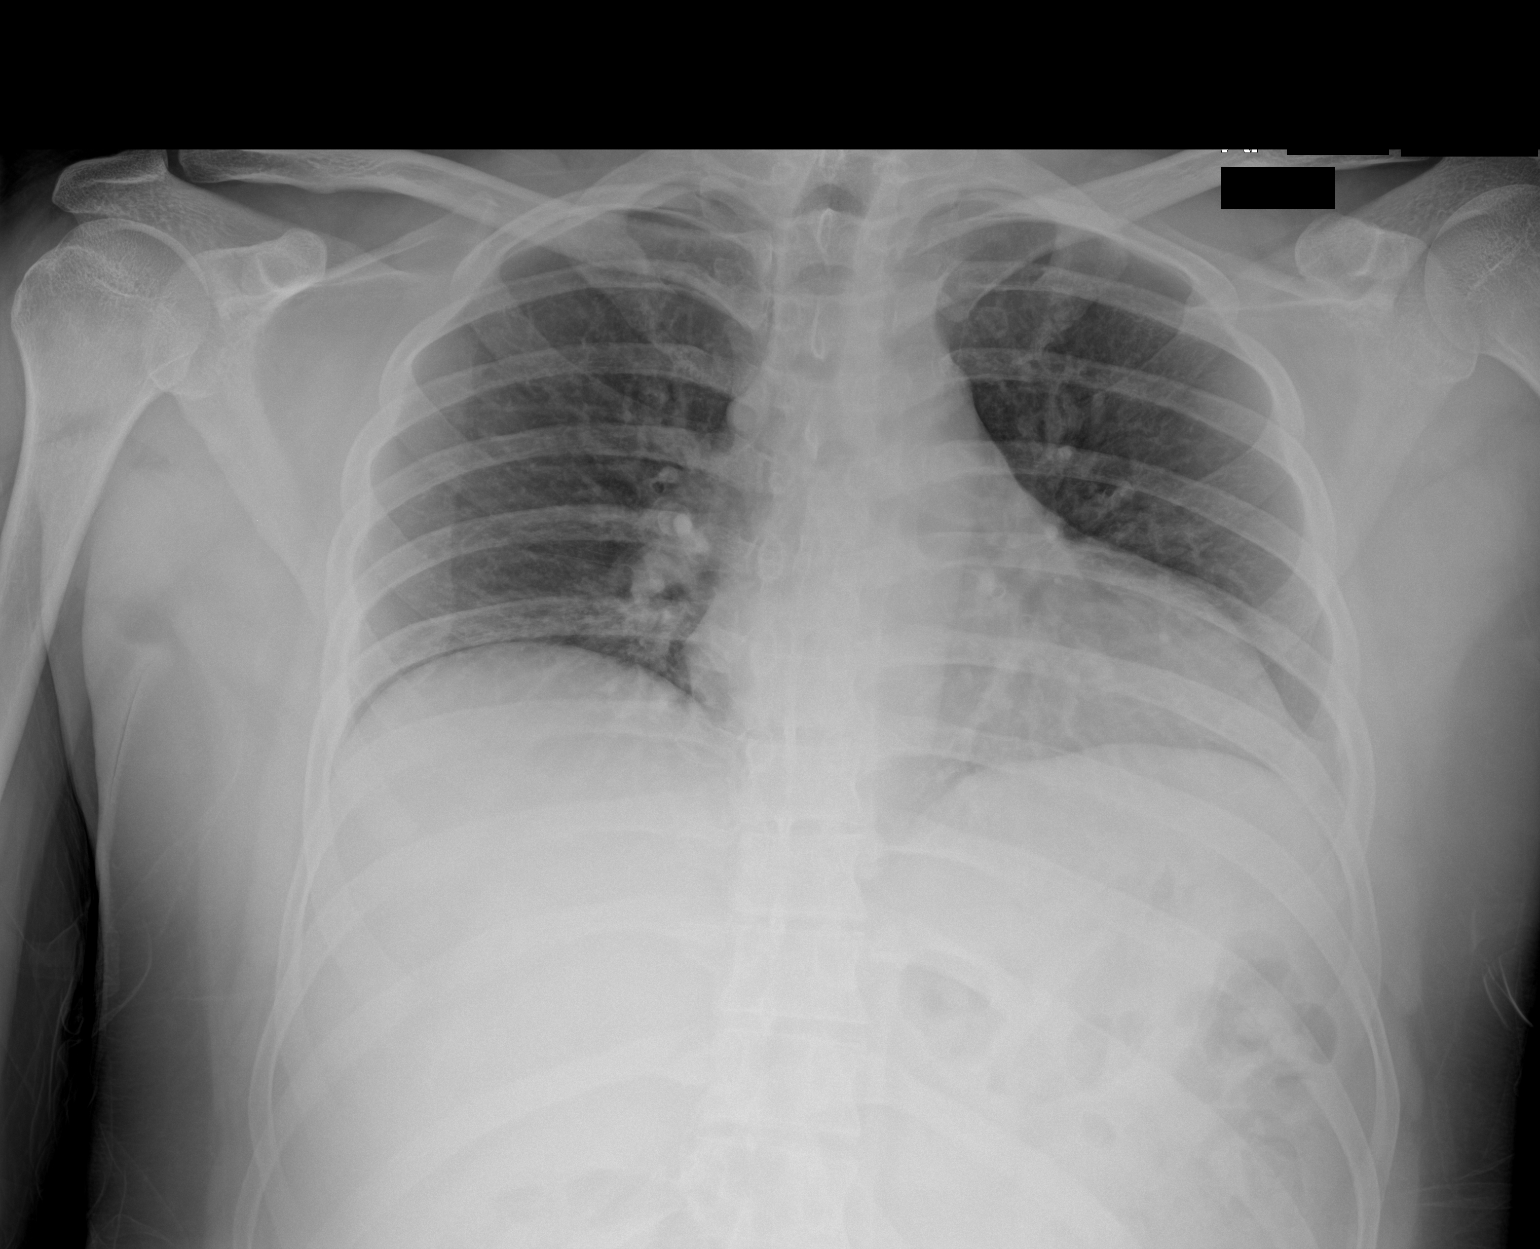

[2 of 2 positions shown; findings below may reference images not displayed]

FINDINGS: Low lung volumes. Heart and mediastinal contours are within normal
limits. No focal opacities or effusions. No acute bony abnormality.
IMPRESSION: Low volumes.  No active cardiopulmonary disease.

## 2021-01-11 ENCOUNTER — Emergency Department
Admission: EM | Admit: 2021-01-11 | Discharge: 2021-01-11 | Disposition: A | Discharge status: Home / Self Care | Payer: No Typology Code available for payment source | Attending: Emergency Medicine | Admitting: Emergency Medicine

## 2021-01-11 ENCOUNTER — Other Ambulatory Visit: Payer: Self-pay

## 2021-01-11 DIAGNOSIS — F1994 Other psychoactive substance use, unspecified with psychoactive substance-induced mood disorder: Secondary | ICD-10-CM | POA: Diagnosis not present

## 2021-01-11 DIAGNOSIS — F17210 Nicotine dependence, cigarettes, uncomplicated: Secondary | ICD-10-CM | POA: Insufficient documentation

## 2021-01-11 DIAGNOSIS — Y906 Blood alcohol level of 120-199 mg/100 ml: Secondary | ICD-10-CM | POA: Diagnosis not present

## 2021-01-11 DIAGNOSIS — F10129 Alcohol abuse with intoxication, unspecified: Secondary | ICD-10-CM | POA: Insufficient documentation

## 2021-01-11 DIAGNOSIS — R0602 Shortness of breath: Secondary | ICD-10-CM | POA: Diagnosis present

## 2021-01-11 DIAGNOSIS — F10929 Alcohol use, unspecified with intoxication, unspecified: Secondary | ICD-10-CM

## 2021-01-11 DIAGNOSIS — F410 Panic disorder [episodic paroxysmal anxiety] without agoraphobia: Secondary | ICD-10-CM | POA: Diagnosis not present

## 2021-01-11 DIAGNOSIS — I10 Essential (primary) hypertension: Secondary | ICD-10-CM | POA: Insufficient documentation

## 2021-01-11 DIAGNOSIS — Z7982 Long term (current) use of aspirin: Secondary | ICD-10-CM | POA: Insufficient documentation

## 2021-01-11 DIAGNOSIS — F329 Major depressive disorder, single episode, unspecified: Secondary | ICD-10-CM | POA: Diagnosis not present

## 2021-01-11 DIAGNOSIS — Z79899 Other long term (current) drug therapy: Secondary | ICD-10-CM | POA: Insufficient documentation

## 2021-01-11 LAB — CBC WITH DIFFERENTIAL/PLATELET
Abs Immature Granulocytes: 0.02 10*3/uL (ref 0.00–0.07)
Basophils Absolute: 0.1 10*3/uL (ref 0.0–0.1)
Basophils Relative: 1 %
Eosinophils Absolute: 0.5 10*3/uL (ref 0.0–0.5)
Eosinophils Relative: 5 %
HCT: 40.3 % (ref 39.0–52.0)
Hemoglobin: 13.7 g/dL (ref 13.0–17.0)
Immature Granulocytes: 0 %
Lymphocytes Relative: 28 %
Lymphs Abs: 2.4 10*3/uL (ref 0.7–4.0)
MCH: 29.5 pg (ref 26.0–34.0)
MCHC: 34 g/dL (ref 30.0–36.0)
MCV: 86.7 fL (ref 80.0–100.0)
Monocytes Absolute: 0.6 10*3/uL (ref 0.1–1.0)
Monocytes Relative: 7 %
Neutro Abs: 5.1 10*3/uL (ref 1.7–7.7)
Neutrophils Relative %: 59 %
Platelets: 299 10*3/uL (ref 150–400)
RBC: 4.65 MIL/uL (ref 4.22–5.81)
RDW: 12.9 % (ref 11.5–15.5)
WBC: 8.6 10*3/uL (ref 4.0–10.5)
nRBC: 0 % (ref 0.0–0.2)

## 2021-01-11 LAB — BASIC METABOLIC PANEL
Anion gap: 14 (ref 5–15)
BUN: 9 mg/dL (ref 6–20)
CO2: 18 mmol/L — ABNORMAL LOW (ref 22–32)
Calcium: 9.4 mg/dL (ref 8.9–10.3)
Chloride: 105 mmol/L (ref 98–111)
Creatinine, Ser: 1.18 mg/dL (ref 0.61–1.24)
GFR, Estimated: >60 mL/min (ref >60)
Glucose, Bld: 115 mg/dL — ABNORMAL HIGH (ref 70–99)
Potassium: 3.5 mmol/L (ref 3.5–5.1)
Sodium: 137 mmol/L (ref 135–145)

## 2021-01-11 LAB — URINE DRUG SCREEN, QUALITATIVE (ARMC ONLY)
Amphetamines, Ur Screen: POSITIVE — AB
Barbiturates, Ur Screen: NOT DETECTED
Benzodiazepine, Ur Scrn: NOT DETECTED
Cannabinoid 50 Ng, Ur ~~LOC~~: NOT DETECTED
Cocaine Metabolite,Ur ~~LOC~~: NOT DETECTED
MDMA (Ecstasy)Ur Screen: NOT DETECTED
Methadone Scn, Ur: NOT DETECTED
Opiate, Ur Screen: NOT DETECTED
Phencyclidine (PCP) Ur S: NOT DETECTED
Tricyclic, Ur Screen: NOT DETECTED

## 2021-01-11 LAB — ETHANOL: Alcohol, Ethyl (B): 123 mg/dL — ABNORMAL HIGH (ref <10)

## 2021-01-11 LAB — MAGNESIUM: Magnesium: 2 mg/dL (ref 1.7–2.4)

## 2021-01-11 LAB — TROPONIN I (HIGH SENSITIVITY): Troponin I (High Sensitivity): 4 ng/L (ref <18)

## 2021-01-11 MED ORDER — DROPERIDOL 2.5 MG/ML IJ SOLN
2.5000 mg | Freq: Once | INTRAMUSCULAR | Status: AC
  Administered 2021-01-11: 2.5 mg via INTRAVENOUS

## 2021-01-11 NOTE — ED Notes (Signed)
Per Dr. York Cerise patient to have ride home before discharge, confirmed with patient that he has attempted to call a ride home. States he is waiting phone call back. Pt sleeping upon entering room but awakens to verbal stimulation

## 2021-01-11 NOTE — ED Notes (Signed)
Pt alert and oriented X 4, stable for discharge. RR even and unlabored, color WNL. Discussed discharge instructions and follow-up as directed. Discharge medications discussed if prescribed. Pt had opportunity to ask questions, and RN to provide patient/family eduction.  

## 2021-01-11 NOTE — ED Triage Notes (Signed)
[facility]. ems reports pt experiencing anxiety attack. pt reported watching TV and started having anxiety attack. Pt reports hx of panic attacks. Denies any pain. Pt a&o x 4. Pt denies known trigger to this RN. pt RR elevated on arrival but able to speak in full sentences without distress. Pt reported to ems that he has been under added stress but did not elaborate. MD present to assess. Pt reported ETOH on board and CP when MD completing assessment.

## 2021-01-11 NOTE — ED Notes (Signed)
Pt states he cannot find friend/family to take him home. Assisting by pt relations Sherika for safe transport home.

## 2021-01-11 NOTE — ED Notes (Signed)
Pt had removed monitors and nasal cannula. Monitors placed back on pt. Pt oxygen saturation currently 96% on RA

## 2021-01-11 NOTE — ED Provider Notes (Signed)
[facility] Emergency Department Provider Note  ____________________________________________   Event Date/Time   First MD Initiated Contact with Patient 01/11/21 0425     (approximate)  I have reviewed the triage vital signs and the nursing notes.   HISTORY  Chief Complaint Panic Attack  Level 5 caveat:  history/ROS limited by acute intoxication  HPI Casey [name] is a 30 y.o. male with medical history as listed below which notably includes depression, polysubstance abuse, and alcohol abuse with alcohol-induced anxiety disorder and multiple prior visits for panic attacks while intoxicated.  He presents tonight by EMS for what he describes as a panic attack.  He said that he has been having trouble sleeping for the last couple of days and has been anxious and having panic attacks.  Tonight he claims he had "2 beers" and then got very scared and anxious.  He said he is having chest pain similar to his prior chest pain and shortness of breath similar to prior episodes.  Nothing in particular makes it better or worse.  He is denying any other drug use at this time although he has a history of cocaine abuse.  Onset of the symptoms has been gradual over a couple days but more acute tonight and were severe.         Past Medical History:  Diagnosis Date  . Anxiety   . H/O eye surgery   . Hypertension   . Polysubstance abuse [facility])     Patient Active Problem List   Diagnosis Date Noted  . Closed left ankle fracture 12/16/2019  . Panic attack   . Current severe episode of major depressive disorder without psychotic features (HCC)   . Cocaine abuse (HCC) 07/27/2019  . 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 Alcohol abuse 05/23/2019  . 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 HAND SURGERY    . ORIF ANKLE FRACTURE Left 12/16/2019   Procedure: OPEN REDUCTION INTERNAL FIXATION (ORIF) ANKLE FRACTURE;  Surgeon: Juanell Fairly, MD;  Location: [facility] ORS;  Service: Orthopedics;  Laterality: Left;    Prior to Admission medications   Medication Sig Start Date End Date Taking? Authorizing Provider  amLODipine (NORVASC) 5 MG tablet Take 1 tablet (5 mg total) by mouth daily. 05/27/19   Clapacs, Jackquline Denmark, MD  aspirin EC 325 MG EC tablet Take 1 tablet (325 mg total) by mouth daily with breakfast. 12/18/19   Juanell Fairly, MD  cetirizine (ZYRTEC ALLERGY) 10 MG tablet Take 1 tablet (10 mg total) by mouth daily. 08/10/20 08/10/21  Tommi Rumps, PA-C  docusate sodium (COLACE) 100 MG capsule Take 1 capsule (100 mg total) by mouth 2 (two) times daily. 12/17/19   Juanell Fairly, MD  lidocaine (XYLOCAINE) 2 % solution Use as directed 10 mLs in the mouth or throat every 4 (four) hours as needed for mouth pain. Swish in mouth and spit 08/10/20   Bridget Hartshorn L, PA-C  oxyCODONE (OXY IR/ROXICODONE) 5 MG immediate release tablet Take 1 tablet (5 mg total) by mouth every 4 (four) hours as needed for moderate pain (pain score 4-6). 12/17/19   Juanell Fairly, MD    Allergies Patient has no known allergies.  Family History  Problem Relation Age of Onset  . Diabetes Father   . Seizures Father     Social History  Social History   Tobacco Use  . Smoking status: Current Every Day Smoker    Types: Cigarettes    Last attempt to quit: 12/02/2019    Years since quitting: 1.1  . Smokeless tobacco: Never Used  Vaping Use  . Vaping Use: Never used  Substance Use Topics  . Alcohol use: Yes  . Drug use: Not Currently    Types: Marijuana    Review of Systems Level 5 caveat:  history/ROS limited by acute intoxication   constitutional: No fever/chills Eyes: No visual changes. ENT: No sore throat. Cardiovascular: Positive for chest pain. Respiratory: Positive for  shortness of breath. Gastrointestinal: No abdominal pain.  No nausea, no vomiting.  No diarrhea.  No constipation. Genitourinary: Negative for dysuria. Musculoskeletal: Negative for neck pain.  Negative for back pain. Integumentary: Negative for rash. Neurological: Negative for headaches, focal weakness or numbness. Psych: Positive for anxiety and panic attack  ____________________________________________   PHYSICAL EXAM:  VITAL SIGNS: ED Triage Vitals  Enc Vitals Group     BP 01/11/21 0428 (!) 140/100     Pulse Rate 01/11/21 0428 (!) 115     Resp 01/11/21 0428 (!) 28     Temp 01/11/21 0428 (!) 97.5 F (36.4 C)     Temp Source 01/11/21 0428 Oral     SpO2 01/11/21 0428 97 %     Weight 01/11/21 0431 115 kg (253 lb 8.5 oz)     Height 01/11/21 0430 1.803 m (5\' 11" )     Head Circumference --      Peak Flow --      Pain Score 01/11/21 0429 10     Pain Loc --      Pain Edu? --      Excl. in GC? --     Constitutional: Alert and oriented.  Eyes: Conjunctivae are normal.  Head: Atraumatic. Nose: No congestion/rhinnorhea. Mouth/Throat: Patient is wearing a mask. Neck: No stridor.  No meningeal signs.   Cardiovascular: Tachycardia, regular rhythm. Good peripheral circulation. Respiratory: Normal respiratory effort.  No retractions. Gastrointestinal: Soft and nontender. No distention.  Musculoskeletal: No lower extremity tenderness nor edema. No gross deformities of extremities. Neurologic:  Normal speech and language. No gross focal neurologic deficits are appreciated.  Skin:  Skin is warm, dry and intact. Psychiatric: Mood and affect are anxious, tearful, upset.  Consistent with panic attack and anxiety.  Denies SI.  ____________________________________________   LABS (all labs ordered are listed, but only abnormal results are displayed)  Labs Reviewed  ETHANOL - Abnormal; Notable for the following components:      Result Value   Alcohol, Ethyl (B) 123 (*)    All other  components within normal limits  BASIC METABOLIC PANEL - Abnormal; Notable for the following components:   CO2 18 (*)    Glucose, Bld 115 (*)    All other components within normal limits  CBC WITH DIFFERENTIAL/PLATELET  MAGNESIUM  URINE DRUG SCREEN, QUALITATIVE ([facility] ONLY)  TROPONIN I (HIGH SENSITIVITY)   ____________________________________________  EKG  ED ECG REPORT I, 03/13/21, the attending physician, personally viewed and interpreted this ECG.  Date: 01/11/2021 EKG Time: 4:27 AM Rate: 120 Rhythm: Sinus tachycardia QRS Axis: normal Intervals: normal, QTc 441 ms ST/T Wave abnormalities: Non-specific ST segment / T-wave changes, but no clear evidence of acute ischemia. Narrative Interpretation: no definitive evidence of acute ischemia; does not meet STEMI criteria.   ____________________________________________    PROCEDURES   Procedure(s) performed (including Critical Care):  .1-3 Lead  EKG Interpretation Performed by: Loleta RoseForbach, Berthold Glace, MD Authorized by: Loleta RoseForbach, Giovanni Bath, MD     Interpretation: abnormal     ECG rate:  118   ECG rate assessment: tachycardic     Rhythm: sinus tachycardia     Ectopy: none     Conduction: normal       ____________________________________________   INITIAL IMPRESSION / MDM / ASSESSMENT AND PLAN / ED COURSE  As part of my medical decision making, I reviewed the following data within the electronic MEDICAL RECORD NUMBER Nursing notes reviewed and incorporated, Labs reviewed , EKG interpreted , Old chart reviewed and Notes from prior ED visits   Differential diagnosis includes, but is not limited to, substance-induced mood disorder, panic attack, with much less likely ACS or PE.  Prinzmetal's angina is also possible.  The patient is on the cardiac monitor to evaluate for evidence of arrhythmia and/or significant heart rate changes.  Patient is quite agitated and admits to intoxication.  I reviewed the medical record and see that he  has had multiple visits for similar issues in the past.  I personally saw him about 10 months ago for atypical chest pain.  The majority the time he simply needs the chance to rest and metabolize.  Given his degree of anxiety and upset right now, I will give him droperidol 2.5 mg IV which should be safe and appropriate even with some alcohol intoxication and preferable to adding benzodiazepines on top of ethanol.  No sign of alcohol withdrawal; his tachycardia is much more likely due to the panic attack.  No evidence of infectious process at this time.  We will monitor and anticipate discharge once he has returned to baseline and metabolized.     Clinical Course as of 01/11/21 0746  Tue Jan 11, 2021  0506 Alcohol, Ethyl (B)(!): 123 Patient somnolent s/p droperidol, desatted to the 80s but easily arousable to voice and light touch.  Placed 4 L of oxygen by nasal cannula now satting 97%. [CF]  [id number] Patient awake and alert.  Feels much better.  He will call for a ride.   [CF]  0746 Reassuring labs including negative troponin.  Low risk ACS. Heart rate normalized. [CF]    Clinical Course User Index [CF] Loleta RoseForbach, Eilah Common, MD     ____________________________________________  FINAL CLINICAL IMPRESSION(S) / ED DIAGNOSES  Final diagnoses:  Panic attack  Alcoholic intoxication with complication (HCC)  Substance induced mood disorder (HCC)     MEDICATIONS GIVEN DURING THIS VISIT:  Medications  droperidol (INAPSINE) 2.5 MG/ML injection 2.5 mg (2.5 mg Intravenous Given 01/11/21 0438)     ED Discharge Orders    None      *Please note:  Casey [name] was evaluated in Emergency Department on 01/11/2021 for the symptoms described in the history of present illness. He was evaluated in the context of the global COVID-19 pandemic, which necessitated consideration that the patient might be at risk for infection with the SARS-CoV-2 virus that causes COVID-19. Institutional protocols and algorithms that  pertain to the evaluation of patients at risk for COVID-19 are in a state of rapid change based on information released by regulatory bodies including the CDC and federal and state organizations. These policies and algorithms were followed during the patient's care in the ED.  Some ED evaluations and interventions may be delayed as a result of limited staffing during and after the pandemic.*  Note:  This document was prepared using Dragon voice recognition software and may include unintentional dictation  errors.   Loleta Rose, MD 01/11/21 [phone]

## 2021-01-11 NOTE — ED Notes (Signed)
Requested patient call for a ride home per EDP request. Pt able to be discharged when ride here.

## 2021-02-06 ENCOUNTER — Other Ambulatory Visit: Payer: Self-pay

## 2021-02-06 ENCOUNTER — Inpatient Hospital Stay
Admission: RE | Admit: 2021-02-06 | Discharge: 2021-02-08 | DRG: 882 | Disposition: A | Discharge status: Home / Self Care | Payer: No Typology Code available for payment source | Source: Intra-hospital | Attending: Psychiatry | Admitting: Psychiatry

## 2021-02-06 ENCOUNTER — Encounter: Payer: Self-pay | Admitting: Psychiatry

## 2021-02-06 ENCOUNTER — Emergency Department
Admission: EM | Admit: 2021-02-06 | Discharge: 2021-02-06 | Disposition: A | Discharge status: Other Acute Inpatient Hospital | Payer: No Typology Code available for payment source | Source: Home / Self Care | Attending: Emergency Medicine | Admitting: Emergency Medicine

## 2021-02-06 DIAGNOSIS — G4700 Insomnia, unspecified: Secondary | ICD-10-CM | POA: Diagnosis present

## 2021-02-06 DIAGNOSIS — Z79899 Other long term (current) drug therapy: Secondary | ICD-10-CM | POA: Insufficient documentation

## 2021-02-06 DIAGNOSIS — Z20822 Contact with and (suspected) exposure to covid-19: Secondary | ICD-10-CM | POA: Insufficient documentation

## 2021-02-06 DIAGNOSIS — F411 Generalized anxiety disorder: Secondary | ICD-10-CM | POA: Diagnosis present

## 2021-02-06 DIAGNOSIS — F331 Major depressive disorder, recurrent, moderate: Secondary | ICD-10-CM

## 2021-02-06 DIAGNOSIS — F89 Unspecified disorder of psychological development: Secondary | ICD-10-CM | POA: Diagnosis present

## 2021-02-06 DIAGNOSIS — F4325 Adjustment disorder with mixed disturbance of emotions and conduct: Secondary | ICD-10-CM | POA: Diagnosis not present

## 2021-02-06 DIAGNOSIS — Z1339 Encounter for screening examination for other mental health and behavioral disorders: Secondary | ICD-10-CM | POA: Insufficient documentation

## 2021-02-06 DIAGNOSIS — I10 Essential (primary) hypertension: Secondary | ICD-10-CM | POA: Insufficient documentation

## 2021-02-06 DIAGNOSIS — F17210 Nicotine dependence, cigarettes, uncomplicated: Secondary | ICD-10-CM | POA: Insufficient documentation

## 2021-02-06 DIAGNOSIS — R45851 Suicidal ideations: Secondary | ICD-10-CM | POA: Diagnosis present

## 2021-02-06 DIAGNOSIS — F1410 Cocaine abuse, uncomplicated: Secondary | ICD-10-CM | POA: Diagnosis present

## 2021-02-06 DIAGNOSIS — F1010 Alcohol abuse, uncomplicated: Secondary | ICD-10-CM | POA: Diagnosis present

## 2021-02-06 DIAGNOSIS — F329 Major depressive disorder, single episode, unspecified: Secondary | ICD-10-CM | POA: Diagnosis not present

## 2021-02-06 DIAGNOSIS — Z7982 Long term (current) use of aspirin: Secondary | ICD-10-CM | POA: Insufficient documentation

## 2021-02-06 DIAGNOSIS — F32A Depression, unspecified: Secondary | ICD-10-CM | POA: Diagnosis present

## 2021-02-06 LAB — URINE DRUG SCREEN, QUALITATIVE (ARMC ONLY)
Amphetamines, Ur Screen: NOT DETECTED
Barbiturates, Ur Screen: NOT DETECTED
Benzodiazepine, Ur Scrn: NOT DETECTED
Cannabinoid 50 Ng, Ur ~~LOC~~: POSITIVE — AB
Cocaine Metabolite,Ur ~~LOC~~: POSITIVE — AB
MDMA (Ecstasy)Ur Screen: NOT DETECTED
Methadone Scn, Ur: NOT DETECTED
Opiate, Ur Screen: NOT DETECTED
Phencyclidine (PCP) Ur S: NOT DETECTED
Tricyclic, Ur Screen: NOT DETECTED

## 2021-02-06 LAB — ETHANOL: Alcohol, Ethyl (B): 65 mg/dL — ABNORMAL HIGH (ref <10)

## 2021-02-06 LAB — CBC
HCT: 44.5 % (ref 39.0–52.0)
Hemoglobin: 15.2 g/dL (ref 13.0–17.0)
MCH: 30 pg (ref 26.0–34.0)
MCHC: 34.2 g/dL (ref 30.0–36.0)
MCV: 87.8 fL (ref 80.0–100.0)
Platelets: 359 10*3/uL (ref 150–400)
RBC: 5.07 MIL/uL (ref 4.22–5.81)
RDW: 13.1 % (ref 11.5–15.5)
WBC: 13.2 10*3/uL — ABNORMAL HIGH (ref 4.0–10.5)
nRBC: 0 % (ref 0.0–0.2)

## 2021-02-06 LAB — COMPREHENSIVE METABOLIC PANEL
ALT: 26 U/L (ref 0–44)
AST: 33 U/L (ref 15–41)
Albumin: 5 g/dL (ref 3.5–5.0)
Alkaline Phosphatase: 97 U/L (ref 38–126)
Anion gap: 13 (ref 5–15)
BUN: 11 mg/dL (ref 6–20)
CO2: 20 mmol/L — ABNORMAL LOW (ref 22–32)
Calcium: 9.4 mg/dL (ref 8.9–10.3)
Chloride: 104 mmol/L (ref 98–111)
Creatinine, Ser: 0.99 mg/dL (ref 0.61–1.24)
GFR, Estimated: >60 mL/min (ref >60)
Glucose, Bld: 95 mg/dL (ref 70–99)
Potassium: 3.9 mmol/L (ref 3.5–5.1)
Sodium: 137 mmol/L (ref 135–145)
Total Bilirubin: 0.8 mg/dL (ref 0.3–1.2)
Total Protein: 8.6 g/dL — ABNORMAL HIGH (ref 6.5–8.1)

## 2021-02-06 LAB — RESP PANEL BY RT-PCR (FLU A&B, COVID) ARPGX2
Influenza A by PCR: NEGATIVE
Influenza B by PCR: NEGATIVE
SARS Coronavirus 2 by RT PCR: NEGATIVE

## 2021-02-06 LAB — ACETAMINOPHEN LEVEL: Acetaminophen (Tylenol), Serum: <10 ug/mL — ABNORMAL LOW (ref 10–30)

## 2021-02-06 LAB — SALICYLATE LEVEL: Salicylate Lvl: <7 mg/dL — ABNORMAL LOW (ref 7.0–30.0)

## 2021-02-06 MED ORDER — LORAZEPAM 1 MG PO TABS
1.0000 mg | ORAL_TABLET | Freq: Once | ORAL | Status: AC
  Administered 2021-02-06: 1 mg via ORAL
  Filled 2021-02-06: qty 1

## 2021-02-06 MED ORDER — MAGNESIUM HYDROXIDE 400 MG/5ML PO SUSP: 30.0000 mL | Freq: Every day | ORAL | Status: DC | PRN

## 2021-02-06 MED ORDER — ACETAMINOPHEN 325 MG PO TABS
650.0000 mg | ORAL_TABLET | Freq: Four times a day (QID) | ORAL | Status: DC | PRN
  Administered 2021-02-07: 650 mg via ORAL
  Filled 2021-02-06: qty 2

## 2021-02-06 MED ORDER — HYDROXYZINE HCL 25 MG PO TABS
25.0000 mg | ORAL_TABLET | Freq: Three times a day (TID) | ORAL | Status: DC | PRN
  Administered 2021-02-06 – 2021-02-07 (×4): 25 mg via ORAL
  Filled 2021-02-06 (×4): qty 1

## 2021-02-06 MED ORDER — ESCITALOPRAM OXALATE 10 MG PO TABS
10.0000 mg | ORAL_TABLET | Freq: Every day | ORAL | Status: DC
  Administered 2021-02-06 – 2021-02-08 (×3): 10 mg via ORAL
  Filled 2021-02-06 (×3): qty 1

## 2021-02-06 MED ORDER — ALUM & MAG HYDROXIDE-SIMETH 200-200-20 MG/5ML PO SUSP: 30.0000 mL | ORAL | Status: DC | PRN

## 2021-02-06 MED ORDER — NITROGLYCERIN 0.4 MG SL SUBL: 0.4000 mg | SUBLINGUAL_TABLET | SUBLINGUAL | Status: DC | PRN

## 2021-02-06 MED ORDER — TRAZODONE HCL 50 MG PO TABS
50.0000 mg | ORAL_TABLET | Freq: Every evening | ORAL | Status: DC | PRN
  Administered 2021-02-07: 50 mg via ORAL
  Filled 2021-02-06: qty 1
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
## 2021-02-06 NOTE — Tx Team (Signed)
Initial Treatment Plan 02/06/2021 3:39 PM Casey [name] YQM:[id number]   PATIENT STRESSORS: Substance abuse Loss of relationship with daughter  PATIENT STRENGTHS: Supportive family/friends   PATIENT IDENTIFIED PROBLEMS: Loss of relationship with daughter  Depression                   DISCHARGE CRITERIA:  Improved stabilization in mood, thinking, and/or behavior  PRELIMINARY DISCHARGE PLAN: Placement in alternative living arrangements  PATIENT/FAMILY INVOLVEMENT: This treatment plan has been presented to and reviewed with the patient, Casey [name].  The patient has been given the opportunity to ask questions and make suggestions.  Celene Kras, RN 02/06/2021, 3:39 PM

## 2021-02-06 NOTE — ED Notes (Signed)
TTS consult completed 

## 2021-02-06 NOTE — ED Notes (Signed)
VOL/pending psych consult 

## 2021-02-06 NOTE — BH Assessment (Signed)
Patient is to be admitted to [facility] by Dr. Windy Kalata.  Attending Physician will be Dr. Neale Burly.   Patient has been assigned to room 316, by [facility] Charge Nurse [place].   ER staff is aware of the admission:  Ronnie, ER Secretary    Dr. Cyril Loosen, ER MD   Lattie Corns, Patient's Nurse   Lynelle Doctor., Patient Access.

## 2021-02-06 NOTE — H&P (Signed)
Psychiatric Admission Assessment Adult  Patient Identification: Casey [name] MRN:  [id number] Date of Evaluation:  02/06/2021 Chief Complaint:  major depression Principal Diagnosis: <principal problem not specified> Diagnosis:  Active Problems:   Suicidal thoughts  Mr. Casey [name] is a 29yo man who with psych history of depression, IDD, substance use disorder (alcohol, cocaine, cannabis), presented to the ER due to having suicidal thoughts [facility] for [facility].   History of Present Illness: Patient reports past history of depression with suicidal thoughts, no suicidal attempts. He is depressed due to being disabled, states he has no friends, states people are getting advantage of him due to his monthly disability check. He is also depressed because he cannot see his daughter as her mother does not let him without reason. He states that a trigger for current suicidality was the event last night - his cousin found out he is using cocaine and the patient is no longer able to live in his cousin`s home. Patient reports that he tried to reach out to several family members, but they seem to be not willing to talk with him. Patient got frustrated and started experiencing suicidal thoughts, no suicidal plan yet. He says that he hopes ha can stay with his mother in [place].  Patient denies homicidal thoughts, although says he wish he could punish his cousin somehow for kicking him out. He believes the cousin is jealous because of patient`s good relationships with cousin`s wife. Patient denies history of violence or aggression. He has an upcoming court date, because he spit at an officer while intoxicated. Patient complaints of high anxiety, mostly in settings of ongoing above-mentioned psycho-social stressors. Patient reports feeling tense when she thinks about the current life situation, unusually restless, experiencing difficulty concentrating because of worry, fear due to uncertain  future. Patient denies any current or past symptoms of mania, such as increased energy, feeling irritable, easily distractible, unusual talkativeness. Denies decreased need for sleep. Patient denies any current or past hallucinations, illusions. Patient does not express any delusions. Patient reports feeling safe in their environment. Reports occasionally poor sleep. Denies problems with appetite. No issues with body image reported. Patient denies past history of trauma or abuse. Patient denies any current physical complaints.  Past Psychiatric History: Past psych Dx: IDD, depression, alcohol abuse No h/o suicidal attempts Past medications: Vyvanse, Trazodone, Latuda, Hydroxyzine. He is not on any psych medications currently.  Social History: Raised by parents. No h/o childhood abuse, Has one sibling. Single. Has one child. 12 grade education. Not working. On disability due to IDD. No past legal issues. He has an upcoming court date, because he spit at an Technical sales engineerofficer. No guns at home.  Substance use: Alcohol: weekend binge drinking. Last drink yesterday 02/05/21. No h/o DTs, seizures. Cannabis: on weekends, last time smoked yesterday. Cocaine: snorts on weekends, last used yesterday.  Family Psychiatric  History: no h/o suicides in family  Associated Signs/Symptoms: Depression Symptoms:  depressed mood, anhedonia, insomnia, psychomotor retardation, fatigue, hopelessness, recurrent thoughts of death, suicidal thoughts without plan, anxiety, Duration of Depression Symptoms: Less than two weeks  (Hypo) Manic Symptoms:  none Anxiety Symptoms:  Excessive Worry, Social Anxiety, Psychotic Symptoms:  none PTSD Symptoms: Negative    Total Time spent with patient: 45 minutes   Is the patient at risk to self? Yes.    Has the patient been a risk to self in the past 6 months? No.  Has the patient been a risk to self within the distant past? No.  Is the patient a risk to others? No.   Has the patient been a risk to others in the past 6 months? No.  Has the patient been a risk to others within the distant past? No.   Prior Inpatient Therapy:   Prior Outpatient Therapy:    Alcohol Screening:   Substance Abuse History in the last 12 months:  Yes.   Consequences of Substance Abuse: Family Consequences:  patient cannot reside at his cousin`s place due to cocaine use Previous Psychotropic Medications: Yes  Psychological Evaluations: Yes  Past Medical History:  Past Medical History:  Diagnosis Date  . Anxiety   . H/O eye surgery   . Hypertension   . Polysubstance abuse [facility])     Past Surgical History:  Procedure Laterality Date  . HAND SURGERY    . ORIF ANKLE FRACTURE Left 12/16/2019   Procedure: OPEN REDUCTION INTERNAL FIXATION (ORIF) ANKLE FRACTURE;  Surgeon: Juanell Fairly, MD;  Location: [facility] ORS;  Service: Orthopedics;  Laterality: Left;   Family History:  Family History  Problem Relation Age of Onset  . Diabetes Father   . Seizures Father      Tobacco Screening:   Social History:  Social History   Substance and Sexual Activity  Alcohol Use Yes     Social History   Substance and Sexual Activity  Drug Use Not Currently  . Types: Marijuana      Allergies:  No Known Allergies Lab Results:  Results for orders placed or performed during the hospital encounter of 02/06/21 (from the past 48 hour(s))  Comprehensive metabolic panel     Status: Abnormal   Collection Time: 02/06/21  4:21 AM  Result Value Ref Range   Sodium 137 135 - 145 mmol/L   Potassium 3.9 3.5 - 5.1 mmol/L   Chloride 104 98 - 111 mmol/L   CO2 20 (L) 22 - 32 mmol/L   Glucose, Bld 95 70 - 99 mg/dL    Comment: Glucose reference range applies only to samples taken after fasting for at least 8 hours.   BUN 11 6 - 20 mg/dL   Creatinine, Ser 7.67 0.61 - 1.24 mg/dL   Calcium 9.4 8.9 - 20.9 mg/dL   Total Protein 8.6 (H) 6.5 - 8.1 g/dL   Albumin 5.0 3.5 - 5.0 g/dL   AST 33 15 - 41  U/L   ALT 26 0 - 44 U/L   Alkaline Phosphatase 97 38 - 126 U/L   Total Bilirubin 0.8 0.3 - 1.2 mg/dL   GFR, Estimated >47 >09 mL/min    Comment: (NOTE) Calculated using the CKD-EPI Creatinine Equation (2021)    Anion gap 13 5 - 15    Comment: Performed at [facility], [address] [place], [place] [address]  Ethanol     Status: Abnormal   Collection Time: 02/06/21  4:21 AM  Result Value Ref Range   Alcohol, Ethyl (B) 65 (H) <10 mg/dL    Comment: (NOTE) Lowest detectable limit for serum alcohol is 10 mg/dL.  For medical purposes only. Performed at [facility], [address] [place], [place] [address]   Salicylate level     Status: Abnormal   Collection Time: 02/06/21  4:21 AM  Result Value Ref Range   Salicylate Lvl <7.0 (L) 7.0 - 30.0 mg/dL    Comment: Performed at [facility], [address], [place], [place] [address]  Acetaminophen level     Status: Abnormal   Collection Time: 02/06/21  4:21 AM  Result Value Ref Range   Acetaminophen (Tylenol), Serum <10 (L) 10 - 30 ug/mL    Comment: (NOTE) Therapeutic concentrations vary significantly. A range of 10-30 ug/mL  may be an effective concentration for many patients. However, some  are best treated at concentrations outside of this range. Acetaminophen concentrations >150 ug/mL at 4 hours after ingestion  and >50 ug/mL at 12 hours after ingestion are often associated with  toxic reactions.  Performed at [facility], [address] [place], [place] [address]   cbc     Status: Abnormal   Collection Time: 02/06/21  4:21 AM  Result Value Ref Range   WBC 13.2 (H) 4.0 - 10.5 K/uL   RBC 5.07 4.22 - 5.81 MIL/uL   Hemoglobin 15.2 13.0 - 17.0 g/dL   HCT 01.7 49.4 - 49.6 %   MCV 87.8 80.0 - 100.0 fL   MCH 30.0 26.0 - 34.0 pg   MCHC 34.2 30.0 - 36.0 g/dL   RDW 75.9 16.3 - 84.6 %   Platelets 359 150 - 400 K/uL   nRBC 0.0 0.0 - 0.2 %    Comment: Performed at [facility], [address] [place], [place] [address]  Urine Drug Screen, Qualitative     Status: Abnormal   Collection Time: 02/06/21  4:21 AM  Result Value Ref Range   Tricyclic, Ur Screen NONE DETECTED NONE DETECTED   Amphetamines, Ur Screen NONE DETECTED NONE DETECTED   MDMA (Ecstasy)Ur Screen NONE DETECTED NONE DETECTED   Cocaine Metabolite,Ur [place] POSITIVE (A) NONE DETECTED   Opiate, Ur Screen NONE DETECTED NONE DETECTED   Phencyclidine (PCP) Ur S NONE DETECTED NONE DETECTED   Cannabinoid 50 Ng, Ur [place] POSITIVE (A) NONE DETECTED   Barbiturates, Ur Screen NONE DETECTED NONE DETECTED   Benzodiazepine, Ur Scrn NONE DETECTED NONE DETECTED   Methadone Scn, Ur NONE DETECTED NONE DETECTED    Comment: (NOTE) Tricyclics + metabolites, urine    Cutoff 1000 ng/mL Amphetamines + metabolites, urine  Cutoff 1000 ng/mL MDMA (Ecstasy), urine              Cutoff 500 ng/mL Cocaine Metabolite, urine          Cutoff 300 ng/mL Opiate + metabolites, urine        Cutoff 300 ng/mL Phencyclidine (PCP), urine         Cutoff 25 ng/mL Cannabinoid, urine                 Cutoff 50 ng/mL Barbiturates + metabolites, urine  Cutoff 200 ng/mL Benzodiazepine, urine              Cutoff 200 ng/mL Methadone, urine                   Cutoff 300 ng/mL  The urine drug screen provides only a preliminary, unconfirmed analytical test result and should not be used for non-medical purposes. Clinical consideration and professional judgment should be applied to any positive drug screen result due to possible interfering substances. A more specific alternate chemical method must be used in order to obtain a confirmed analytical result. Gas chromatography / mass spectrometry (GC/MS) is the preferred confirm atory method. Performed at [facility], [address] [place], [place] [address]   Resp Panel by RT-PCR (Flu A&B, Covid)     Status: None   Collection Time: 02/06/21  4:34 AM   Specimen: Nasopharyngeal(NP) swabs in  vial transport medium  Result Value  Ref Range   SARS Coronavirus 2 by RT PCR NEGATIVE NEGATIVE    Comment: (NOTE) SARS-CoV-2 target nucleic acids are NOT DETECTED.  The SARS-CoV-2 RNA is generally detectable in upper respiratory specimens during the acute phase of infection. The lowest concentration of SARS-CoV-2 viral copies this assay can detect is 138 copies/mL. A negative result does not preclude SARS-Cov-2 infection and should not be used as the sole basis for treatment or other patient management decisions. A negative result may occur with  improper specimen collection/handling, submission of specimen other than nasopharyngeal swab, presence of viral mutation(s) within the areas targeted by this assay, and inadequate number of viral copies(<138 copies/mL). A negative result must be combined with clinical observations, patient history, and epidemiological information. The expected result is Negative.  Fact Sheet for Patients:  BloggerCourse.com  Fact Sheet for Healthcare Providers:  SeriousBroker.it  This test is no t yet approved or cleared by the [place] FDA and  has been authorized for detection and/or diagnosis of SARS-CoV-2 by FDA under an Emergency Use Authorization (EUA). This EUA will remain  in effect (meaning this test can be used) for the duration of the COVID-19 declaration under Section 564(b)(1) of the Act, 21 U.S.C.section 360bbb-3(b)(1), unless the authorization is terminated  or revoked sooner.       Influenza A by PCR NEGATIVE NEGATIVE   Influenza B by PCR NEGATIVE NEGATIVE    Comment: (NOTE) The Xpert Xpress SARS-CoV-2/FLU/RSV plus assay is intended as an aid in the diagnosis of influenza from Nasopharyngeal swab specimens and should not be used as a sole basis for treatment. Nasal washings and aspirates are unacceptable for Xpert Xpress SARS-CoV-2/FLU/RSV testing.  Fact Sheet for  Patients: BloggerCourse.com  Fact Sheet for Healthcare Providers: SeriousBroker.it  This test is not yet approved or cleared by the [place] FDA and has been authorized for detection and/or diagnosis of SARS-CoV-2 by FDA under an Emergency Use Authorization (EUA). This EUA will remain in effect (meaning this test can be used) for the duration of the COVID-19 declaration under Section 564(b)(1) of the Act, 21 U.S.C. section 360bbb-3(b)(1), unless the authorization is terminated or revoked.  Performed at [facility], [address] [place], [place] [address]     Blood Alcohol level:  Lab Results  Component Value Date   ETH 65 (H) 02/06/2021   ETH 123 (H) 01/11/2021    Metabolic Disorder Labs:  Lab Results  Component Value Date   HGBA1C 5.8 (H) 02/09/2018   MPG 119.76 02/09/2018   No results found for: PROLACTIN No results found for: CHOL, TRIG, HDL, CHOLHDL, VLDL, LDLCALC  Current Medications: No current facility-administered medications for this encounter.   PTA Medications: Medications Prior to Admission  Medication Sig Dispense Refill Last Dose  . amLODipine (NORVASC) 5 MG tablet Take 1 tablet (5 mg total) by mouth daily. 30 tablet 1   . aspirin EC 325 MG EC tablet Take 1 tablet (325 mg total) by mouth daily with breakfast. 45 tablet 0   . cetirizine (ZYRTEC ALLERGY) 10 MG tablet Take 1 tablet (10 mg total) by mouth daily. 20 tablet 0   . docusate sodium (COLACE) 100 MG capsule Take 1 capsule (100 mg total) by mouth 2 (two) times daily. 10 capsule 0   . lidocaine (XYLOCAINE) 2 % solution Use as directed 10 mLs in the mouth or throat every 4 (four) hours as needed for mouth pain. Swish in mouth and spit 60 mL 0   . oxyCODONE (  OXY IR/ROXICODONE) 5 MG immediate release tablet Take 1 tablet (5 mg total) by mouth every 4 (four) hours as needed for moderate pain (pain score 4-6). 40 tablet 0      Musculoskeletal: Strength & Muscle Tone: within normal limits Gait & Station: normal Patient leans: N/A  Psychiatric Specialty Exam: Appearance: Latino M, appearing stated age, appears well-nourished;  wearing appropriate to the weather and situation casual clothes, with fair grooming and hygiene. Normal level of alertness and appropriate facial expression. Arm tattoos.  Attitude/Behavior: tearful at times, calm, cooperative, engaging with appropriate eye contact.  Motor: WNL; dyskinesias not evident. Gait appears in full range.  Speech: spontaneous, clear, coherent, normal comprehension.  Mood: "depressed".  Affect: restricted  Thought process: patient appears coherent, organized, logical, goal-directed, associations are appropriate.  Thought content: patient reports vague suicidal thoughts without plan, denies homicidal thoughts; did not express any delusions.  Thought perception: patient denies auditory and visual hallucinations. Did not appear internally stimulated.  Cognition: patient is alert and oriented in self, place, date; with intact attention and concentration.  Insight: limited, in regards of understanding of presence, nature, cause, and significance of mental or emotional problem.  Judgement: questionable, in regards of ability to make good decisions concerning the appropriate thing to do in various situations, including ability to form opinions regarding their mental health condition.  Physical Exam: Physical Exam Vitals reviewed.  Constitutional:      Appearance: Normal appearance.  HENT:     Head: Normocephalic and atraumatic.  Eyes:     Extraocular Movements: Extraocular movements intact.     Pupils: Pupils are equal, round, and reactive to light.  Cardiovascular:     Rate and Rhythm: Normal rate and regular rhythm.  Pulmonary:     Breath sounds: Normal breath sounds.  Abdominal:     General: Abdomen is flat.  Skin:    General: Skin is warm and  dry.  Neurological:     Mental Status: He is alert.    Review of Systems  Constitutional: Negative for chills and fever.  HENT: Negative for hearing loss.   Eyes: Negative for blurred vision.  Respiratory: Negative for cough and shortness of breath.   Cardiovascular: Negative for chest pain.  Gastrointestinal: Negative for constipation, diarrhea and vomiting.  Neurological: Negative for tremors, focal weakness and seizures.  Psychiatric/Behavioral: Positive for depression, substance abuse and suicidal ideas. Negative for hallucinations and memory loss. The patient is nervous/anxious and has insomnia.    There were no vitals taken for this visit. There is no height or weight on file to calculate BMI.  Treatment Plan Summary: Daily contact with patient to assess and evaluate symptoms and progress in treatment and Medication management   29yo man who with psych history of depression, IDD, substance use disorder (alcohol, cocaine, cannabis), presented to the ER due to having suicidal thoughts admitted to [facility] unit for stabilization.  PLAN: -inpatient psychiatric admission will be continued. -patient will be integrated in the milieu.   -patient will be encouraged to attend groups.    -Medications: will start Lexapro  daily to help his anxiety and depression.    -patient will be monitored on CIWA for sx of acute alcohol withdrawal.  -Disposition will be determined after the patient is stabilized.    Observation Level/Precautions:  15 minute checks  Laboratory:   Psychotherapy:    Medications:    Consultations:    Discharge Concerns:    Estimated LOS:  Other:     Physician Treatment  Plan for Primary Diagnosis: <principal problem not specified> Long Term Goal(s): Improvement in symptoms so as ready for discharge  Short Term Goals: Ability to identify changes in lifestyle to reduce recurrence of condition will improve, Ability to verbalize feelings will improve, Ability to  disclose and discuss suicidal ideas, Ability to demonstrate self-control will improve, Ability to identify and develop effective coping behaviors will improve, Ability to maintain clinical measurements within normal limits will improve, Compliance with prescribed medications will improve and Ability to identify triggers associated with substance abuse/mental health issues will improve  Physician Treatment Plan for Secondary Diagnosis: Active Problems:   Suicidal thoughts  Long Term Goal(s): Improvement in symptoms so as ready for discharge  Short Term Goals: Ability to identify changes in lifestyle to reduce recurrence of condition will improve, Ability to verbalize feelings will improve, Ability to disclose and discuss suicidal ideas, Ability to demonstrate self-control will improve, Ability to identify and develop effective coping behaviors will improve, Ability to maintain clinical measurements within normal limits will improve, Compliance with prescribed medications will improve and Ability to identify triggers associated with substance abuse/mental health issues will improve  I certify that inpatient services furnished can reasonably be expected to improve the patient's condition.    Thalia Party, MD 5/1/20221:59 PM

## 2021-02-06 NOTE — Progress Notes (Signed)
Patient is tearful but cooperative during admission assessment. Patient is depressed and reports needing help. He states his main stressor is not being able to see his daughter, because her mother's new husband does not like him. He also feels he is being taken advantage of and being used for his disability check. Because of these stressors, patient reports beginning to drink heavily (12 pack of beer almost every day), as well as use cocaine and marijuana. Patient also reports smoking 2 packs of cigarettes daily. Patient reports the trigger for suicidal thoughts last night was that his cousin found out he was using cocaine and kicked him out of the house. Patient currently denies SI, HI, and AVH but endorses anxiety. Patient remains safe on the unit at this time. Support and encouragement is provided.

## 2021-02-06 NOTE — Progress Notes (Signed)
Patient reports feeling better. He is resting in bed and is not observed to be in any distress.

## 2021-02-06 NOTE — Progress Notes (Addendum)
Patient came to nursing station complaining of chest tightness and pain. Vitals were taken, BP was 148/106, pulse was 102, and O2 sat was 98%. MD was notified. EKG was performed, patient had normal sinus rhythm, QT/QTc was 370/402. EKG was placed in chart and MD notified of results. Patient mentioned it felt similar to a panic attack he has had in the past. Patient was given 1mg  Ativan. Patient was informed to let staff know if pain improves or gets worse.

## 2021-02-06 NOTE — ED Notes (Signed)
Report given to receiving nurse. Patient to be transferred to lower level BHU

## 2021-02-06 NOTE — ED Notes (Signed)
Pt dressed in behavioral (blue) scrubs with myself and Dawn RN present.  Pts belongings, include zip up sweatshirt, tshirt, undershirt, underwear, sweatpants, socks and sneakers, wallet and cell phone, bagged, labelled and placed at nurses station to be locked up.  Pt ambulated to 19H.  Pt cooperative

## 2021-02-06 NOTE — BHH Suicide Risk Assessment (Signed)
[facility] Admission Suicide Risk Assessment   Nursing information obtained from:    Demographic factors:    Current Mental Status:    Loss Factors:    Historical Factors:    Risk Reduction Factors:     Total Time spent with patient: 45 minutes Principal Problem: <principal problem not specified> Diagnosis:  Active Problems:   Suicidal thoughts   Depressive disorder  Subjective Data:  30yo man who with psych history of depression, IDD, substance use disorder (alcohol, cocaine, cannabis), presented to the ER due to having suicidal thoughts admitted to [facility] unit for stabilization.    Continued Clinical Symptoms:    vague suicidal thoughts  CLINICAL FACTORS:   Depression:   Hopelessness Alcohol/Substance Abuse/Dependencies  COGNITIVE FEATURES THAT CONTRIBUTE TO RISK:  None    SUICIDE RISK:   Moderate:  Frequent suicidal ideation with limited intensity, and duration, some specificity in terms of plans, no associated intent, good self-control, limited dysphoria/symptomatology, some risk factors present, and identifiable protective factors, including available and accessible social support.  PLAN OF CARE:  -inpatient psychiatric admission will be continued. -patient will be integrated in the milieu.  -patient will be encouraged to attend groups.  -Medications: will start Lexapro 10mg  daily to help his anxiety and depression.  -patient will be monitored on CIWA for sx of acute alcohol withdrawal. -Disposition will be determined after the patient is stabilized.   I certify that inpatient services furnished can reasonably be expected to improve the patient's condition.   , MD 02/06/2021, 2:06 PM

## 2021-02-06 NOTE — ED Provider Notes (Signed)
[facility] Emergency Department Provider Note   ____________________________________________   Event Date/Time   First MD Initiated Contact with Patient 02/06/21 0425     (approximate)  I have reviewed the triage vital signs and the nursing notes.   HISTORY  Chief Complaint Psychiatric Evaluation    HPI Casey [name] is a 30 y.o. male who presents to the ED from home voluntarily seeking behavioral medicine evaluation.  Patient reports having suicidal thoughts without plan.  Denies HI/AH/VH.  Voices no medical complaints.     Past Medical History:  Diagnosis Date  . Anxiety   . H/O eye surgery   . Hypertension   . Polysubstance abuse [facility])     Patient Active Problem List   Diagnosis Date Noted  . Closed left ankle fracture 12/16/2019  . Panic attack   . Current severe episode of major depressive disorder without psychotic features (HCC)   . Cocaine abuse (HCC) 07/27/2019  . 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 Alcohol abuse 05/23/2019  . 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 HAND SURGERY    . ORIF ANKLE FRACTURE Left 12/16/2019   Procedure: OPEN REDUCTION INTERNAL FIXATION (ORIF) ANKLE FRACTURE;  Surgeon: Juanell Fairly, MD;  Location: [facility] ORS;  Service: Orthopedics;  Laterality: Left;    Prior to Admission medications   Medication Sig Start Date End Date Taking? Authorizing Provider  amLODipine (NORVASC) 5 MG tablet Take 1 tablet (5 mg total) by mouth daily. 05/27/19   Clapacs, Jackquline Denmark, MD  aspirin EC 325 MG EC tablet Take 1 tablet (325 mg total) by mouth daily with breakfast. 12/18/19   Juanell Fairly, MD  cetirizine (ZYRTEC ALLERGY) 10 MG tablet Take 1 tablet (10 mg total) by mouth daily. 08/10/20 08/10/21  Tommi Rumps, PA-C   docusate sodium (COLACE) 100 MG capsule Take 1 capsule (100 mg total) by mouth 2 (two) times daily. 12/17/19   Juanell Fairly, MD  lidocaine (XYLOCAINE) 2 % solution Use as directed 10 mLs in the mouth or throat every 4 (four) hours as needed for mouth pain. Swish in mouth and spit 08/10/20   Bridget Hartshorn L, PA-C  oxyCODONE (OXY IR/ROXICODONE) 5 MG immediate release tablet Take 1 tablet (5 mg total) by mouth every 4 (four) hours as needed for moderate pain (pain score 4-6). 12/17/19   Juanell Fairly, MD    Allergies Patient has no known allergies.  Family History  Problem Relation Age of Onset  . Diabetes Father   . Seizures Father     Social History Social History   Tobacco Use  . Smoking status: Current Every Day Smoker    Types: Cigarettes    Last attempt to quit: 12/02/2019    Years since quitting: 1.1  . Smokeless tobacco: Never Used  Vaping Use  . Vaping Use: Never used  Substance Use Topics  . Alcohol use: Yes  . Drug use: Not Currently    Types: Marijuana    Review of Systems  Constitutional: No fever/chills Eyes: No visual changes. ENT: No sore throat. Cardiovascular: Denies chest pain. Respiratory: Denies shortness of breath. Gastrointestinal: No abdominal pain.  No nausea, no vomiting.  No diarrhea.  No constipation. Genitourinary: Negative for dysuria. Musculoskeletal: Negative for back pain. Skin: Negative for rash. Neurological: Negative for headaches, focal weakness or numbness. Psychiatric:  Positive for SI.  ____________________________________________   PHYSICAL EXAM:  VITAL SIGNS: ED Triage Vitals  Enc Vitals Group     BP 02/06/21 0415 (!) 144/90     Pulse Rate 02/06/21 0415 (!) 119     Resp 02/06/21 0415 20     Temp 02/06/21 0415 98.2 F (36.8 C)     Temp Source 02/06/21 0415 Oral     SpO2 02/06/21 0415 95 %     Weight 02/06/21 0416 250 lb (113.4 kg)     Height 02/06/21 0416 5\' 9"  (1.753 m)     Head Circumference --      Peak  Flow --      Pain Score 02/06/21 0416 0     Pain Loc --      Pain Edu? --      Excl. in GC? --     Constitutional: Alert and oriented. Well appearing and in no acute distress. Eyes: Conjunctivae are normal. PERRL. EOMI. Head: Atraumatic. Nose: No congestion/rhinnorhea. Mouth/Throat: Mucous membranes are moist.   Neck: No stridor.   Cardiovascular: Normal rate, regular rhythm. Grossly normal heart sounds.  Good peripheral circulation. Respiratory: Normal respiratory effort.  No retractions. Lungs CTAB. Gastrointestinal: Soft and nontender. No distention. No abdominal bruits. No CVA tenderness. Musculoskeletal: No lower extremity tenderness nor edema.  No joint effusions. Neurologic:  Normal speech and language. No gross focal neurologic deficits are appreciated. No gait instability. Skin:  Skin is warm, dry and intact. No rash noted. Psychiatric: Mood and affect are tearful. Speech and behavior are normal.  ____________________________________________   LABS (all labs ordered are listed, but only abnormal results are displayed)  Labs Reviewed  COMPREHENSIVE METABOLIC PANEL  ETHANOL  SALICYLATE LEVEL  ACETAMINOPHEN LEVEL  CBC  URINE DRUG SCREEN, QUALITATIVE ([facility] ONLY)   ____________________________________________  EKG  None ____________________________________________  RADIOLOGY I, Desma Wilkowski J, personally viewed and evaluated these images (plain radiographs) as part of my medical decision making, as well as reviewing the written report by the radiologist.  ED MD interpretation: None  Official radiology report(s): No results found.  ____________________________________________   PROCEDURES  Procedure(s) performed (including Critical Care):  Procedures   ____________________________________________   INITIAL IMPRESSION / ASSESSMENT AND PLAN / ED COURSE  As part of my medical decision making, I reviewed the following data within the electronic medical  record:  Nursing notes reviewed and incorporated, Labs reviewed, Old chart reviewed, A consult was requested and obtained from this/these consultant(s) Psychiatry and Notes from prior ED visits     30 year old male presenting with suicidal thoughts without plan.  Contracts for safety while in the emergency department. The patient has been placed in psychiatric observation due to the need to provide a safe environment for the patient while obtaining psychiatric consultation and evaluation, as well as ongoing medical and medication management to treat the patient's condition.  The patient has not been placed under full IVC at this time.       ____________________________________________   FINAL CLINICAL IMPRESSION(S) / ED DIAGNOSES  Final diagnoses:  Moderate episode of recurrent major depressive disorder [facility])     ED Discharge Orders    None      *Please note:  Casey [name] was evaluated in Emergency Department on 02/06/2021 for the symptoms described in the history of present illness. He was evaluated in the context of the global COVID-19 pandemic, which necessitated consideration that the patient might be at risk for infection with the SARS-CoV-2 virus that causes COVID-19. Institutional  protocols and algorithms that pertain to the evaluation of patients at risk for COVID-19 are in a state of rapid change based on information released by regulatory bodies including the CDC and federal and state organizations. These policies and algorithms were followed during the patient's care in the ED.  Some ED evaluations and interventions may be delayed as a result of limited staffing during and the pandemic.*   Note:  This document was prepared using Dragon voice recognition software and may include unintentional dictation errors.   Irean Hong, MD 02/06/21 [id number]

## 2021-02-06 NOTE — ED Triage Notes (Signed)
Pt to ED reports that he wants to commit himself because he is suicidal.

## 2021-02-07 DIAGNOSIS — F4325 Adjustment disorder with mixed disturbance of emotions and conduct: Principal | ICD-10-CM

## 2021-02-07 MED ORDER — AMLODIPINE BESYLATE 5 MG PO TABS
5.0000 mg | ORAL_TABLET | Freq: Every day | ORAL | Status: DC
  Administered 2021-02-07 – 2021-02-08 (×2): 5 mg via ORAL
  Filled 2021-02-07 (×2): qty 1

## 2021-02-07 NOTE — Plan of Care (Signed)
Patient isolated to his room and for meals patient wait till everyone finishes. Patient verbalized anxiety about his court dates and homelessness. Vistaril helped " some " with his anxiety.Denies SI,HI and AVH. Patient refused to go to groups for " social anxiety." Called chaplin for patient. Appetite and energy level good. Support and encouragement given.

## 2021-02-07 NOTE — Tx Team (Addendum)
Interdisciplinary Treatment and Diagnostic Plan Update  02/07/2021 Time of Session: 9:40AM Casey [name] MRN: [id number]  Principal Diagnosis: <principal problem not specified>  Secondary Diagnoses: Active Problems:   Suicidal thoughts   Depressive disorder   Current Medications:  Current Facility-Administered Medications  Medication Dose Route Frequency Provider Last Rate Last Admin  . acetaminophen (TYLENOL) tablet 650 mg  650 mg Oral Q6H PRN Larita Fife, MD   650 mg at 02/07/21 0828  . alum & mag hydroxide-simeth (MAALOX/MYLANTA) 200-200-20 MG/5ML suspension 30 mL  30 mL Oral Q4H PRN Paliy, Alisa, MD      . escitalopram (LEXAPRO) tablet 10 mg  10 mg Oral Daily Paliy, Delrae Rend, MD   10 mg at 02/07/21 0827  . hydrOXYzine (ATARAX/VISTARIL) tablet 25 mg  25 mg Oral TID PRN Larita Fife, MD   25 mg at 02/07/21 0828  . magnesium hydroxide (MILK OF MAGNESIA) suspension 30 mL  30 mL Oral Daily PRN Larita Fife, MD      . nitroGLYCERIN (NITROSTAT) SL tablet 0.4 mg  0.4 mg Sublingual Q5 min PRN Larita Fife, MD      . traZODone (DESYREL) tablet 50 mg  50 mg Oral QHS PRN Larita Fife, MD       PTA Medications: Medications Prior to Admission  Medication Sig Dispense Refill Last Dose  . amLODipine (NORVASC) 5 MG tablet Take 1 tablet (5 mg total) by mouth daily. 30 tablet 1   . aspirin EC 325 MG EC tablet Take 1 tablet (325 mg total) by mouth daily with breakfast. 45 tablet 0   . cetirizine (ZYRTEC ALLERGY) 10 MG tablet Take 1 tablet (10 mg total) by mouth daily. 20 tablet 0   . docusate sodium (COLACE) 100 MG capsule Take 1 capsule (100 mg total) by mouth 2 (two) times daily. 10 capsule 0   . lidocaine (XYLOCAINE) 2 % solution Use as directed 10 mLs in the mouth or throat every 4 (four) hours as needed for mouth pain. Swish in mouth and spit 60 mL 0   . oxyCODONE (OXY IR/ROXICODONE) 5 MG immediate release tablet Take 1 tablet (5 mg total) by mouth every 4 (four) hours as needed for moderate pain  (pain score 4-6). 40 tablet 0     Patient Stressors: Substance abuse  Patient Strengths: Supportive family/friends  Treatment Modalities: Medication Management, Group therapy, Case management,  1 to 1 session with clinician, Psychoeducation, Recreational therapy.   Physician Treatment Plan for Primary Diagnosis: <principal problem not specified> Long Term Goal(s): Improvement in symptoms so as ready for discharge Improvement in symptoms so as ready for discharge   Short Term Goals: Ability to identify changes in lifestyle to reduce recurrence of condition will improve Ability to verbalize feelings will improve Ability to disclose and discuss suicidal ideas Ability to demonstrate self-control will improve Ability to identify and develop effective coping behaviors will improve Ability to maintain clinical measurements within normal limits will improve Compliance with prescribed medications will improve Ability to identify triggers associated with substance abuse/mental health issues will improve Ability to identify changes in lifestyle to reduce recurrence of condition will improve Ability to verbalize feelings will improve Ability to disclose and discuss suicidal ideas Ability to demonstrate self-control will improve Ability to identify and develop effective coping behaviors will improve Ability to maintain clinical measurements within normal limits will improve Compliance with prescribed medications will improve Ability to identify triggers associated with substance abuse/mental health issues will improve  Medication Management: Evaluate patient's response, side effects,  and tolerance of medication regimen.  Therapeutic Interventions: 1 to 1 sessions, Unit Group sessions and Medication administration.  Evaluation of Outcomes: Not Met  Physician Treatment Plan for Secondary Diagnosis: Active Problems:   Suicidal thoughts   Depressive disorder  Long Term Goal(s): Improvement  in symptoms so as ready for discharge Improvement in symptoms so as ready for discharge   Short Term Goals: Ability to identify changes in lifestyle to reduce recurrence of condition will improve Ability to verbalize feelings will improve Ability to disclose and discuss suicidal ideas Ability to demonstrate self-control will improve Ability to identify and develop effective coping behaviors will improve Ability to maintain clinical measurements within normal limits will improve Compliance with prescribed medications will improve Ability to identify triggers associated with substance abuse/mental health issues will improve Ability to identify changes in lifestyle to reduce recurrence of condition will improve Ability to verbalize feelings will improve Ability to disclose and discuss suicidal ideas Ability to demonstrate self-control will improve Ability to identify and develop effective coping behaviors will improve Ability to maintain clinical measurements within normal limits will improve Compliance with prescribed medications will improve Ability to identify triggers associated with substance abuse/mental health issues will improve     Medication Management: Evaluate patient's response, side effects, and tolerance of medication regimen.  Therapeutic Interventions: 1 to 1 sessions, Unit Group sessions and Medication administration.  Evaluation of Outcomes: Not Met   RN Treatment Plan for Primary Diagnosis: <principal problem not specified> Long Term Goal(s): Knowledge of disease and therapeutic regimen to maintain health will improve  Short Term Goals: Ability to remain free from injury will improve, Ability to verbalize frustration and anger appropriately will improve, Ability to demonstrate self-control, Ability to participate in decision making will improve, Ability to verbalize feelings will improve, Ability to disclose and discuss suicidal ideas, Ability to identify and develop  effective coping behaviors will improve and Compliance with prescribed medications will improve  Medication Management: RN will administer medications as ordered by provider, will assess and evaluate patient's response and provide education to patient for prescribed medication. RN will report any adverse and/or side effects to prescribing provider.  Therapeutic Interventions: 1 on 1 counseling sessions, Psychoeducation, Medication administration, Evaluate responses to treatment, Monitor vital signs and CBGs as ordered, Perform/monitor CIWA, COWS, AIMS and Fall Risk screenings as ordered, Perform wound care treatments as ordered.  Evaluation of Outcomes: Not Met   LCSW Treatment Plan for Primary Diagnosis: <principal problem not specified> Long Term Goal(s): Safe transition to appropriate next level of care at discharge, Engage patient in therapeutic group addressing interpersonal concerns.  Short Term Goals: Engage patient in aftercare planning with referrals and resources, Increase social support, Increase ability to appropriately verbalize feelings, Increase emotional regulation, Facilitate acceptance of mental health diagnosis and concerns, Facilitate patient progression through stages of change regarding substance use diagnoses and concerns, Identify triggers associated with mental health/substance abuse issues and Increase skills for wellness and recovery  Therapeutic Interventions: Assess for all discharge needs, 1 to 1 time with Social worker, Explore available resources and support systems, Assess for adequacy in community support network, Educate family and significant other(s) on suicide prevention, Complete Psychosocial Assessment, Interpersonal group therapy.  Evaluation of Outcomes: Not Met   Progress in Treatment: Attending groups: No. Participating in groups: No. Taking medication as prescribed: Yes. Toleration medication: Yes. Family/Significant other contact made: No, will  contact:  when given permission. Patient understands diagnosis: No. Discussing patient identified problems/goals with staff: Yes. Medical problems stabilized  or resolved: Yes. Denies suicidal/homicidal ideation: Yes. Issues/concerns per patient self-inventory: No. Other: none.  New problem(s) identified: No, Describe:  none.  New Short Term/Long Term Goal(s): detox, medication management for mood stabilization; elimination of SI thoughts; development of comprehensive mental wellness/sobriety plan.  Patient Goals: "Trying to think about where I'm going because I'm homeless."   Discharge Plan or Barriers: CSW will assist pt with development of an appropriate discharge/aftercare plan.  Reason for Continuation of Hospitalization: Depression Medication stabilization Suicidal ideation Withdrawal symptoms  Estimated Length of Stay: 1-7 days   Recreational Therapy: Patient Stressors: N/A Patient Goal: Patient will engage in groups without prompting or encouragement from LRT x3 group sessions within 5 recreation therapy group sessions.   Attendees: Patient: Liahm Grivas 02/07/2021 10:29 AM  Physician: Gonzella Lex, MD 02/07/2021 10:29 AM  Nursing: West Pugh, RN 02/07/2021 10:29 AM  RN Care Manager: 02/07/2021 10:29 AM  Social Worker: Chalmers Guest. Guerry Bruin, MSW, [place], [place] 02/07/2021 10:29 AM  Recreational Therapist: Devin Going, LRT  02/07/2021 10:29 AM  Other: Kiva Martinique, MSW, LCSW-A 02/07/2021 10:29 AM  Other: Paulla Dolly, MSW, [place] 02/07/2021 10:29 AM  Other: 02/07/2021 10:29 AM    Scribe for Treatment Team: Shirl Harris, LCSW 02/07/2021 10:29 AM

## 2021-02-07 NOTE — Progress Notes (Signed)
Recreation Therapy Notes  INPATIENT RECREATION TR PLAN  Patient Details Name: Marv Alfrey MRN: [id number] DOB: 1991/03/08 Today's Date: 02/07/2021  Rec Therapy Plan Is patient appropriate for Therapeutic Recreation?: Yes Treatment times per week: at least 3 Estimated Length of Stay: 5-7 days TR Treatment/Interventions: Group participation (Comment)  Discharge Criteria Pt will be discharged from therapy if:: Discharged Treatment plan/goals/alternatives discussed and agreed upon by:: Patient/family  Discharge Summary     Yaniyah Koors 02/07/2021, 3:40 PM

## 2021-02-07 NOTE — [facility] Suicide Risk Assessment (Addendum)
[facility] INPATIENT:  Family/Significant Other Suicide Prevention Education  Suicide Prevention Education:  Contact Attempts: Serafina Mitchell (name of family member/significant other) has been identified by the patient as the family member/significant other with whom the patient will be residing, and identified as the person(s) who will aid the patient in the event of a mental health crisis.  With written consent from the patient, two attempts were made to provide suicide prevention education, prior to and/or following the patient's discharge.  We were unsuccessful in providing suicide prevention education.  A suicide education pamphlet was given to the patient to share with family/significant other.  Date and time of first attempt: 02/07/21/1:26pm Date and time of second attempt: 02/08/21/ 10:00AM  Casey [name] 02/07/2021, 1:26 PM
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
## 2021-02-07 NOTE — Plan of Care (Signed)
  Problem: Education: Goal: Knowledge of [facility] Education information/materials will improve Outcome: Not Progressing Goal: Emotional status will improve Outcome: Not Progressing Goal: Mental status will improve Outcome: Not Progressing Goal: Verbalization of understanding the information provided will improve Outcome: Not Progressing   Problem: Activity: Goal: Interest or engagement in activities will improve Outcome: Not Progressing Goal: Sleeping patterns will improve Outcome: Not Progressing   Problem: Coping: Goal: Ability to verbalize frustrations and anger appropriately will improve Outcome: Not Progressing Goal: Ability to demonstrate self-control will improve Outcome: Not Progressing   Problem: Health Behavior/Discharge Planning: Goal: Identification of resources available to assist in meeting health care needs will improve Outcome: Not Progressing Goal: Compliance with treatment plan for underlying cause of condition will improve Outcome: Not Progressing   Problem: Physical Regulation: Goal: Ability to maintain clinical measurements within normal limits will improve Outcome: Not Progressing   Problem: Coping: Goal: Coping ability will improve Outcome: Not Progressing Goal: Will verbalize feelings Outcome: Not Progressing   Problem: Health Behavior/Discharge Planning: Goal: Ability to make decisions will improve Outcome: Not Progressing Goal: Compliance with therapeutic regimen will improve Outcome: Not Progressing

## 2021-02-07 NOTE — Progress Notes (Signed)
Patient is quiet and reserved. Has been isolative to his room and declined snack this evening. Patient has no ordered QHS meds and declined the need for PRN medication. He denies any pain and reports that the chest discomfort that he was suffering from earlier in the day has now subsided. He denies SI HI  AVH and pain at this encounter. He does endorse having minimal anxiety and depression, but did not want to rate when asked about 0-10 scale. He remains under monitor Q15 minutes and was encouraged to seek staff for any concerns.      Cleo Butler-Nicholson,LPN
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
## 2021-02-07 NOTE — [facility] Counselor (Signed)
CSW met with pt briefly regarding placement options. He was provided with a boarding house list. He stated that if team could find him one of these it would be ok as well. CSW explained the process for boarding houses and that pt would need to contact them himself. He agreed. No other concerns expressed. Contact ended without incident.   Chalmers Guest. Guerry Bruin, MSW, [place], [place] 02/07/2021 2:38 PM

## 2021-02-07 NOTE — Progress Notes (Signed)
Recreation Therapy Notes  INPATIENT RECREATION THERAPY ASSESSMENT  Patient Details Name: Casey [name] MRN: [id number] DOB: 1991-09-29 Today's Date: 02/07/2021       Information Obtained From: Patient  Able to Participate in Assessment/Interview: Yes  Patient Presentation: Responsive  Reason for Admission (Per Patient): Active Symptoms,Suicidal Ideation,Substance Abuse  Patient Stressors: Other (Comment) (Homeless)  Coping Skills:   Isolation,Meditate,Deep Breathing  Leisure Interests (2+):  Sports - [employer] - Listen  Frequency of Recreation/Participation: Weekly  Awareness of Community Resources:  Yes  Community Resources:  [employer]  Current Use: Yes  If no, Barriers?: Transportation,Financial  Expressed Interest in [employer] Information: Yes  [place]  Patient Main Form of Transportation: Walk  Patient Strengths:  Good heart and good listener  Patient Identified Areas of Improvement:  Stay away from bad stuff  Patient Goal for Hospitalization:  Find somewhere to go and do better for my daughter  Current SI (including self-harm):  No  Current HI:  No  Current AVH: No  Staff Intervention Plan: Group Attendance,Collaborate with Interdisciplinary Treatment Team  Consent to Intern Participation: N/A  Lazaria Schaben 02/07/2021, 3:38 PM

## 2021-02-07 NOTE — Progress Notes (Signed)
Recreation Therapy Notes    Date: 02/07/2021  Time: 10:00 am   Location: Craft room     Behavioral response: N/A   Intervention Topic: Self- esteem    Discussion/Intervention: Patient did not attend group.   Clinical Observations/Feedback:  Patient did not attend group.   Leyani Gargus LRT/CTRS          Heloise Gordan 02/07/2021 11:49 AM

## 2021-02-07 NOTE — [facility] Group Notes (Signed)
LCSW Group Therapy Note   02/07/2021 2:28 PM  Type of Therapy and Topic:  Group Therapy:  Overcoming Obstacles   Participation Level:  Did Not Attend   Description of Group:    In this group patients will be encouraged to explore what they see as obstacles to their own wellness and recovery. They will be guided to discuss their thoughts, feelings, and behaviors related to these obstacles. The group will process together ways to cope with barriers, with attention given to specific choices patients can make. Each patient will be challenged to identify changes they are motivated to make in order to overcome their obstacles. This group will be process-oriented, with patients participating in exploration of their own experiences as well as giving and receiving support and challenge from other group members.   Therapeutic Goals: 1. Patient will identify personal and current obstacles as they relate to admission. 2. Patient will identify barriers that currently interfere with their wellness or overcoming obstacles.  3. Patient will identify feelings, thought process and behaviors related to these barriers. 4. Patient will identify two changes they are willing to make to overcome these obstacles:      Summary of Patient Progress Patient did not attend group despite encouraged participation.      Therapeutic Modalities:   Cognitive Behavioral Therapy Solution Focused Therapy Motivational Interviewing Relapse Prevention Therapy  Gwenevere Ghazi, MSW, [place], [place] 02/07/2021 2:28 PM

## 2021-02-07 NOTE — Progress Notes (Signed)
[facility] MD Progress Note  02/07/2021 3:39 PM Casey [name]  MRN:  [id number] Subjective: Follow-up for this 30 year old man admitted to the hospital because of depression and stated suicidal ideation.  Patient seen today.  He also met with the treatment team.  Patient reports that his mood became depressed when his cousin threw him out of the house.  He says this happened when the cousin discovered that he had been using cocaine and alcohol.  Patient's answers to questions tended to be short and concrete.  He appears to have some degree of developmental disability.  On interview today he says his mood is feeling better.  He denies any wish to die or thought of killing himself.  States that he would like to stop using cocaine and alcohol but mainly needs a place to live.  No psychotic symptoms.  Noted that blood pressure is running high consistently. Principal Problem: Adjustment disorder with mixed disturbance of emotions and conduct Diagnosis: Principal Problem:   Adjustment disorder with mixed disturbance of emotions and conduct Active Problems:   Alcohol abuse   Developmental disability   Cocaine abuse (HCC)   Suicidal thoughts   Depressive disorder  Total Time spent with patient: 30 minutes  Past Psychiatric History: Past history of substance abuse although it sounds like he has never actually been in any kind of substance abuse treatment before.  He says he has had psychiatric hospitalizations before but cannot remember any follow-up from it.  Past Medical History:  Past Medical History:  Diagnosis Date  . Anxiety   . H/O eye surgery   . Hypertension   . Polysubstance abuse [facility])     Past Surgical History:  Procedure Laterality Date  . HAND SURGERY    . ORIF ANKLE FRACTURE Left 12/16/2019   Procedure: OPEN REDUCTION INTERNAL FIXATION (ORIF) ANKLE FRACTURE;  Surgeon: Thornton Park, MD;  Location: [facility] ORS;  Service: Orthopedics;  Laterality: Left;   Family History:  Family History   Problem Relation Age of Onset  . Diabetes Father   . Seizures Father    Family Psychiatric  History: None reported Social History:  Social History   Substance and Sexual Activity  Alcohol Use Yes   Comment: 12 pack almost daily     Social History   Substance and Sexual Activity  Drug Use Yes  . Types: Marijuana, Cocaine    Social History   Socioeconomic History  . Marital status: Single    Spouse name: Not on file  . Number of children: Not on file  . Years of education: Not on file  . Highest education level: Not on file  Occupational History  . Not on file  Tobacco Use  . Smoking status: Current Every Day Smoker    Packs/day: 2.00    Types: Cigarettes  . Smokeless tobacco: Never Used  Vaping Use  . Vaping Use: Never used  Substance and Sexual Activity  . Alcohol use: Yes    Comment: 12 pack almost daily  . Drug use: Yes    Types: Marijuana, Cocaine  . Sexual activity: Not on file  Other Topics Concern  . Not on file  Social History Narrative  . Not on file   Social Determinants of Health   Financial Resource Strain: Not on file  Food Insecurity: Not on file  Transportation Needs: Not on file  Physical Activity: Not on file  Stress: Not on file  Social Connections: Not on file   Additional Social History:  Sleep: Fair  Appetite:  Fair  Current Medications: Current Facility-Administered Medications  Medication Dose Route Frequency Provider Last Rate Last Admin  . acetaminophen (TYLENOL) tablet 650 mg  650 mg Oral Q6H PRN Larita Fife, MD   650 mg at 02/07/21 0828  . alum & mag hydroxide-simeth (MAALOX/MYLANTA) 200-200-20 MG/5ML suspension 30 mL  30 mL Oral Q4H PRN Paliy, Alisa, MD      . amLODipine (NORVASC) tablet 5 mg  5 mg Oral Daily Kaylum Shrum T, MD      . escitalopram (LEXAPRO) tablet 10 mg  10 mg Oral Daily Paliy, Delrae Rend, MD   10 mg at 02/07/21 0827  . hydrOXYzine (ATARAX/VISTARIL) tablet 25 mg  25 mg Oral  TID PRN Larita Fife, MD   25 mg at 02/07/21 7867  . magnesium hydroxide (MILK OF MAGNESIA) suspension 30 mL  30 mL Oral Daily PRN Larita Fife, MD      . nitroGLYCERIN (NITROSTAT) SL tablet 0.4 mg  0.4 mg Sublingual Q5 min PRN Larita Fife, MD      . traZODone (DESYREL) tablet 50 mg  50 mg Oral QHS PRN Larita Fife, MD        Lab Results:  Results for orders placed or performed during the hospital encounter of 02/06/21 (from the past 48 hour(s))  Comprehensive metabolic panel     Status: Abnormal   Collection Time: 02/06/21  4:21 AM  Result Value Ref Range   Sodium 137 135 - 145 mmol/L   Potassium 3.9 3.5 - 5.1 mmol/L   Chloride 104 98 - 111 mmol/L   CO2 20 (L) 22 - 32 mmol/L   Glucose, Bld 95 70 - 99 mg/dL    Comment: Glucose reference range applies only to samples taken after fasting for at least 8 hours.   BUN 11 6 - 20 mg/dL   Creatinine, Ser 0.99 0.61 - 1.24 mg/dL   Calcium 9.4 8.9 - 10.3 mg/dL   Total Protein 8.6 (H) 6.5 - 8.1 g/dL   Albumin 5.0 3.5 - 5.0 g/dL   AST 33 15 - 41 U/L   ALT 26 0 - 44 U/L   Alkaline Phosphatase 97 38 - 126 U/L   Total Bilirubin 0.8 0.3 - 1.2 mg/dL   GFR, Estimated >60 >60 mL/min    Comment: (NOTE) Calculated using the CKD-EPI Creatinine Equation (2021)    Anion gap 13 5 - 15    Comment: Performed at [facility], [address] [place], [place] [address]  Ethanol     Status: Abnormal   Collection Time: 02/06/21  4:21 AM  Result Value Ref Range   Alcohol, Ethyl (B) 65 (H) <10 mg/dL    Comment: (NOTE) Lowest detectable limit for serum alcohol is 10 mg/dL.  For medical purposes only. Performed at [facility], [address] [place], [place] [address]   Salicylate level     Status: Abnormal   Collection Time: 02/06/21  4:21 AM  Result Value Ref Range   Salicylate Lvl <2.8 (L) 7.0 - 30.0 mg/dL    Comment: Performed at [facility], [place], [place] [address]  Acetaminophen level     Status:  Abnormal   Collection Time: 02/06/21  4:21 AM  Result Value Ref Range   Acetaminophen (Tylenol), Serum <10 (L) 10 - 30 ug/mL    Comment: (NOTE) Therapeutic concentrations vary significantly. A range of 10-30 ug/mL  may be an effective concentration for many patients. However, some  are  best treated at concentrations outside of this range. Acetaminophen concentrations >150 ug/mL at 4 hours after ingestion  and >50 ug/mL at 12 hours after ingestion are often associated with  toxic reactions.  Performed at [facility], [address] [place], [place] [address]   cbc     Status: Abnormal   Collection Time: 02/06/21  4:21 AM  Result Value Ref Range   WBC 13.2 (H) 4.0 - 10.5 K/uL   RBC 5.07 4.22 - 5.81 MIL/uL   Hemoglobin 15.2 13.0 - 17.0 g/dL   HCT 44.5 39.0 - 52.0 %   MCV 87.8 80.0 - 100.0 fL   MCH 30.0 26.0 - 34.0 pg   MCHC 34.2 30.0 - 36.0 g/dL   RDW 13.1 11.5 - 15.5 %   Platelets 359 150 - 400 K/uL   nRBC 0.0 0.0 - 0.2 %    Comment: Performed at [facility], [address] [place], [place] [address]  Urine Drug Screen, Qualitative     Status: Abnormal   Collection Time: 02/06/21  4:21 AM  Result Value Ref Range   Tricyclic, Ur Screen NONE DETECTED NONE DETECTED   Amphetamines, Ur Screen NONE DETECTED NONE DETECTED   MDMA (Ecstasy)Ur Screen NONE DETECTED NONE DETECTED   Cocaine Metabolite,Ur [place] POSITIVE (A) NONE DETECTED   Opiate, Ur Screen NONE DETECTED NONE DETECTED   Phencyclidine (PCP) Ur S NONE DETECTED NONE DETECTED   Cannabinoid 50 Ng, Ur [place] POSITIVE (A) NONE DETECTED   Barbiturates, Ur Screen NONE DETECTED NONE DETECTED   Benzodiazepine, Ur Scrn NONE DETECTED NONE DETECTED   Methadone Scn, Ur NONE DETECTED NONE DETECTED    Comment: (NOTE) Tricyclics + metabolites, urine    Cutoff 1000 ng/mL Amphetamines + metabolites, urine  Cutoff 1000 ng/mL MDMA (Ecstasy), urine              Cutoff 500 ng/mL Cocaine Metabolite, urine          Cutoff 300  ng/mL Opiate + metabolites, urine        Cutoff 300 ng/mL Phencyclidine (PCP), urine         Cutoff 25 ng/mL Cannabinoid, urine                 Cutoff 50 ng/mL Barbiturates + metabolites, urine  Cutoff 200 ng/mL Benzodiazepine, urine              Cutoff 200 ng/mL Methadone, urine                   Cutoff 300 ng/mL  The urine drug screen provides only a preliminary, unconfirmed analytical test result and should not be used for non-medical purposes. Clinical consideration and professional judgment should be applied to any positive drug screen result due to possible interfering substances. A more specific alternate chemical method must be used in order to obtain a confirmed analytical result. Gas chromatography / mass spectrometry (GC/MS) is the preferred confirm atory method. Performed at [facility], [address] [place], [place] [address]   Resp Panel by RT-PCR (Flu A&B, Covid)     Status: None   Collection Time: 02/06/21  4:34 AM   Specimen: Nasopharyngeal(NP) swabs in vial transport medium  Result Value Ref Range   SARS Coronavirus 2 by RT PCR NEGATIVE NEGATIVE    Comment: (NOTE) SARS-CoV-2 target nucleic acids are NOT DETECTED.  The SARS-CoV-2 RNA is generally detectable in upper respiratory specimens during the acute phase of infection. The lowest concentration of SARS-CoV-2  viral copies this assay can detect is 138 copies/mL. A negative result does not preclude SARS-Cov-2 infection and should not be used as the sole basis for treatment or other patient management decisions. A negative result may occur with  improper specimen collection/handling, submission of specimen other than nasopharyngeal swab, presence of viral mutation(s) within the areas targeted by this assay, and inadequate number of viral copies(<138 copies/mL). A negative result must be combined with clinical observations, patient history, and epidemiological information. The expected result is  Negative.  Fact Sheet for Patients:  EntrepreneurPulse.com.au  Fact Sheet for Healthcare Providers:  IncredibleEmployment.be  This test is no t yet approved or cleared by the [place] FDA and  has been authorized for detection and/or diagnosis of SARS-CoV-2 by FDA under an Emergency Use Authorization (EUA). This EUA will remain  in effect (meaning this test can be used) for the duration of the COVID-19 declaration under Section 564(b)(1) of the Act, 21 U.S.C.section 360bbb-3(b)(1), unless the authorization is terminated  or revoked sooner.       Influenza A by PCR NEGATIVE NEGATIVE   Influenza B by PCR NEGATIVE NEGATIVE    Comment: (NOTE) The Xpert Xpress SARS-CoV-2/FLU/RSV plus assay is intended as an aid in the diagnosis of influenza from Nasopharyngeal swab specimens and should not be used as a sole basis for treatment. Nasal washings and aspirates are unacceptable for Xpert Xpress SARS-CoV-2/FLU/RSV testing.  Fact Sheet for Patients: EntrepreneurPulse.com.au  Fact Sheet for Healthcare Providers: IncredibleEmployment.be  This test is not yet approved or cleared by the [place] FDA and has been authorized for detection and/or diagnosis of SARS-CoV-2 by FDA under an Emergency Use Authorization (EUA). This EUA will remain in effect (meaning this test can be used) for the duration of the COVID-19 declaration under Section 564(b)(1) of the Act, 21 U.S.C. section 360bbb-3(b)(1), unless the authorization is terminated or revoked.  Performed at [facility], [address] [place], [place] [address]     Blood Alcohol level:  Lab Results  Component Value Date   ETH 65 (H) 02/06/2021   ETH 123 (H) 69/67/8938    Metabolic Disorder Labs: Lab Results  Component Value Date   HGBA1C 5.8 (H) 02/09/2018   MPG 119.76 02/09/2018   No results found for: PROLACTIN No results found for:  CHOL, TRIG, HDL, CHOLHDL, VLDL, LDLCALC  Physical Findings: AIMS:  , ,  ,  ,    CIWA:  CIWA-Ar Total: 1 COWS:     Musculoskeletal: Strength & Muscle Tone: within normal limits Gait & Station: normal Patient leans: N/A  Psychiatric Specialty Exam:  Presentation  General Appearance: No data recorded Eye Contact:No data recorded Speech:No data recorded Speech Volume:No data recorded Handedness:No data recorded  Mood and Affect  Mood:No data recorded Affect:No data recorded  Thought Process  Thought Processes:No data recorded Descriptions of Associations:No data recorded Orientation:No data recorded Thought Content:No data recorded History of Schizophrenia/Schizoaffective disorder:No  Duration of Psychotic Symptoms:No data recorded Hallucinations:No data recorded Ideas of Reference:No data recorded Suicidal Thoughts:No data recorded Homicidal Thoughts:No data recorded  Sensorium  Memory:No data recorded Judgment:No data recorded Insight:No data recorded  Executive Functions  Concentration:No data recorded Attention Span:No data recorded Recall:No data recorded Fund of Knowledge:No data recorded Language:No data recorded  Psychomotor Activity  Psychomotor Activity:No data recorded  Assets  Assets:No data recorded  Sleep  Sleep:No data recorded   Physical Exam: Physical Exam Vitals and nursing note reviewed.  Constitutional:      Appearance: Normal  appearance.  HENT:     Head: Normocephalic and atraumatic.     Mouth/Throat:     Pharynx: Oropharynx is clear.  Eyes:     Pupils: Pupils are equal, round, and reactive to light.  Cardiovascular:     Rate and Rhythm: Normal rate and regular rhythm.  Pulmonary:     Effort: Pulmonary effort is normal.     Breath sounds: Normal breath sounds.  Abdominal:     General: Abdomen is flat.     Palpations: Abdomen is soft.  Musculoskeletal:        General: Normal range of motion.  Skin:    General: Skin is  warm and dry.  Neurological:     General: No focal deficit present.     Mental Status: He is alert. Mental status is at baseline.  Psychiatric:        Mood and Affect: Mood normal.        Thought Content: Thought content normal.    Review of Systems  Constitutional: Negative.   HENT: Negative.   Eyes: Negative.   Respiratory: Negative.   Cardiovascular: Negative.   Gastrointestinal: Negative.   Musculoskeletal: Negative.   Skin: Negative.   Neurological: Negative.   Psychiatric/Behavioral: Negative.    Blood pressure (!) 125/107, pulse 98, temperature 97.6 F (36.4 C), temperature source Oral, resp. rate 16, height 5' 9.5" (1.765 m), weight 98.9 kg, SpO2 100 %. Body mass index is 31.73 kg/m.   Treatment Plan Summary: Medication management and Plan Adding amlodipine for blood pressure.  Otherwise no change to medicine.  Patient not reporting major depression symptoms right now that would require medication treatment.  Treatment team will work with the patient on seeing if we can find a place for him to stay.  As of today he is already telling me he thinks he could stay with his sister.  Likely length of stay may be 1 to 3 days at most I would think.  Alethia Berthold, MD 02/07/2021, 3:39 PM

## 2021-02-08 MED ORDER — CETIRIZINE HCL 10 MG PO TABS: 10.0000 mg | ORAL_TABLET | Freq: Every day | ORAL | 1 refills | Status: AC

## 2021-02-08 MED ORDER — AMLODIPINE BESYLATE 5 MG PO TABS: 5.0000 mg | ORAL_TABLET | Freq: Every day | ORAL | 1 refills | Status: AC

## 2021-02-08 MED ORDER — TRAZODONE HCL 50 MG PO TABS: 50.0000 mg | ORAL_TABLET | Freq: Every evening | ORAL | 1 refills | Status: AC | PRN

## 2021-02-08 MED ORDER — ESCITALOPRAM OXALATE 10 MG PO TABS: 10.0000 mg | ORAL_TABLET | Freq: Every day | ORAL | 1 refills | Status: AC

## 2021-02-08 NOTE — Progress Notes (Signed)
Recreation Therapy Notes   Date: 02/08/2021  Time: 9:30 am   Location: Courtyard  Behavioral response: N/A   Intervention Topic: Social skills    Discussion/Intervention: Patient did not attend group.   Clinical Observations/Feedback:  Patient did not attend group.   [name] LRT/CTRS         Casey [name] 02/08/2021 11:02 AM

## 2021-02-08 NOTE — BHH Suicide Risk Assessment (Signed)
[facility] Discharge Suicide Risk Assessment   Principal Problem: Adjustment disorder with mixed disturbance of emotions and conduct Discharge Diagnoses: Principal Problem:   Adjustment disorder with mixed disturbance of emotions and conduct Active Problems:   Alcohol abuse   Developmental disability   Cocaine abuse (HCC)   Suicidal thoughts   Depressive disorder   Total Time spent with patient: 30 minutes  Musculoskeletal: Strength & Muscle Tone: within normal limits Gait & Station: normal Patient leans: N/A  Psychiatric Specialty Exam: Review of Systems  Constitutional: Negative.   HENT: Negative.   Eyes: Negative.   Respiratory: Negative.   Cardiovascular: Negative.   Gastrointestinal: Negative.   Musculoskeletal: Negative.   Skin: Negative.   Neurological: Negative.   Psychiatric/Behavioral: Negative.     Blood pressure (!) 126/99, pulse (!) 108, temperature 98.2 F (36.8 C), temperature source Oral, resp. rate 16, height 5' 9.5" (1.765 m), weight 98.9 kg, SpO2 97 %.Body mass index is 31.73 kg/m.  General Appearance: Casual  Eye Contact::  Good  Speech:  Clear and Coherent409  Volume:  Normal  Mood:  Euthymic  Affect:  Constricted  Thought Process:  Goal Directed  Orientation:  Full (Time, Place, and Person)  Thought Content:  Logical  Suicidal Thoughts:  No  Homicidal Thoughts:  No  Memory:  Immediate;   Fair Recent;   Fair Remote;   Fair  Judgement:  Good  Insight:  Fair  Psychomotor Activity:  Normal  Concentration:  Fair  Recall:  [employer] of Knowledge:Fair  Language: Fair  Akathisia:  No  Handed:  Right  AIMS (if indicated):     Assets:  Desire for Improvement Housing Physical Health  Sleep:  Number of Hours: 7.75  Cognition: Impaired,  Mild  ADL's:  Intact   Mental Status Per Nursing Assessment::   On Admission:  NA  Demographic Factors:  Male  Loss Factors: Legal issues  Historical Factors: Impulsivity  Risk Reduction Factors:    Living with another person, especially a relative  Continued Clinical Symptoms:  Alcohol/Substance Abuse/Dependencies  Cognitive Features That Contribute To Risk:  None    Suicide Risk:  Minimal: No identifiable suicidal ideation.  Patients presenting with no risk factors but with morbid ruminations; may be classified as minimal risk based on the severity of the depressive symptoms   Follow-up Information    [facility] Follow up.   Specialty: Behavioral Health Why: Please present for walk in hours Mon-Fri @ 0830 am.  Contact information: [address] [place] [address] [phone]              Plan Of Care/Follow-up recommendations:  Activity:  As tolerated Diet:  Carb restricted diabetic Other:  Follow-up with outpatient treatment in the community  Mordecai Rasmussen, MD 02/08/2021, 11:30 AM

## 2021-02-08 NOTE — Progress Notes (Signed)
Patient better today. More social and active on the unit. Less isolation.  Came to get PRN QHS meds tonight without being prompted.  Better affect may be contributed to his sister saying that he can come and stay with her and his housing issue has been remedied.  No psychotic symptoms observed. Continues to be monitored with 15 minute safety rounds.  Encouraged to come to staff with any concerns.    Cleo Butler-Nicholson, LPN

## 2021-02-08 NOTE — Discharge Summary (Signed)
Physician Discharge Summary Note  Patient:  Casey [name] is an 30 y.o., male MRN:  [id number] DOB:  1991-03-19 Patient phone:  [phone] (home)  Patient address:   [address] [place] [address],  Total Time spent with patient: 45 minutes  Date of Admission:  02/06/2021 Date of Discharge: 02/08/2021  Reason for Admission: Admitted after presenting to the emergency room reporting suicidal ideation in the context of substance abuse and life stresses  Principal Problem: Adjustment disorder with mixed disturbance of emotions and conduct Discharge Diagnoses: Principal Problem:   Adjustment disorder with mixed disturbance of emotions and conduct Active Problems:   Alcohol abuse   Developmental disability   Cocaine abuse (HCC)   Suicidal thoughts   Depressive disorder   Past Psychiatric History: Past history of intellectual disability  Past Medical History:  Past Medical History:  Diagnosis Date  . Anxiety   . H/O eye surgery   . Hypertension   . Polysubstance abuse [facility])     Past Surgical History:  Procedure Laterality Date  . HAND SURGERY    . ORIF ANKLE FRACTURE Left 12/16/2019   Procedure: OPEN REDUCTION INTERNAL FIXATION (ORIF) ANKLE FRACTURE;  Surgeon: Juanell Fairly, MD;  Location: [facility] ORS;  Service: Orthopedics;  Laterality: Left;   Family History:  Family History  Problem Relation Age of Onset  . Diabetes Father   . Seizures Father    Family Psychiatric  History: None reported Social History:  Social History   Substance and Sexual Activity  Alcohol Use Yes   Comment: 12 pack almost daily     Social History   Substance and Sexual Activity  Drug Use Yes  . Types: Marijuana, Cocaine    Social History   Socioeconomic History  . Marital status: Single    Spouse name: Not on file  . Number of children: Not on file  . Years of education: Not on file  . Highest education level: Not on file  Occupational History  . Not on file  Tobacco  Use  . Smoking status: Current Every Day Smoker    Packs/day: 2.00    Types: Cigarettes  . Smokeless tobacco: Never Used  Vaping Use  . Vaping Use: Never used  Substance and Sexual Activity  . Alcohol use: Yes    Comment: 12 pack almost daily  . Drug use: Yes    Types: Marijuana, Cocaine  . Sexual activity: Not on file  Other Topics Concern  . Not on file  Social History Narrative  . Not on file   Social Determinants of Health   Financial Resource Strain: Not on file  Food Insecurity: Not on file  Transportation Needs: Not on file  Physical Activity: Not on file  Stress: Not on file  Social Connections: Not on file    Hospital Course: Patient displayed no dangerous behavior in the hospital.  Consistently denied suicidal thoughts.  Behavior was appropriate and calm.  Tolerated medicine well.  Patient talked about how his primary concerns were being homeless and having a court date coming up.  At the time of discharge it has been confirmed that he can stay with his mother.  Prescriptions made up for his current medications including antidepressant.  Strongly encouraged patient with counseling to discontinue cocaine and alcohol use.  Referral made to follow-up in the [facility] area  Physical Findings: AIMS:  , ,  ,  ,    CIWA:  CIWA-Ar Total: 5 COWS:  Musculoskeletal: Strength & Muscle Tone: within normal limits Gait & Station: normal Patient leans: N/A                Psychiatric Specialty Exam:  Presentation  General Appearance: No data recorded Eye Contact:No data recorded Speech:No data recorded Speech Volume:No data recorded Handedness:No data recorded  Mood and Affect  Mood:No data recorded Affect:No data recorded  Thought Process  Thought Processes:No data recorded Descriptions of Associations:No data recorded Orientation:No data recorded Thought Content:No data recorded History of Schizophrenia/Schizoaffective  disorder:No  Duration of Psychotic Symptoms:No data recorded Hallucinations:No data recorded Ideas of Reference:No data recorded Suicidal Thoughts:No data recorded Homicidal Thoughts:No data recorded  Sensorium  Memory:No data recorded Judgment:No data recorded Insight:No data recorded  Executive Functions  Concentration:No data recorded Attention Span:No data recorded Recall:No data recorded Fund of Knowledge:No data recorded Language:No data recorded  Psychomotor Activity  Psychomotor Activity:No data recorded  Assets  Assets:No data recorded  Sleep  Sleep:No data recorded   Physical Exam: Physical Exam Vitals and nursing note reviewed.  Constitutional:      Appearance: Normal appearance.  HENT:     Head: Normocephalic and atraumatic.     Mouth/Throat:     Pharynx: Oropharynx is clear.  Eyes:     Pupils: Pupils are equal, round, and reactive to light.  Cardiovascular:     Rate and Rhythm: Normal rate and regular rhythm.  Pulmonary:     Effort: Pulmonary effort is normal.     Breath sounds: Normal breath sounds.  Abdominal:     General: Abdomen is flat.     Palpations: Abdomen is soft.  Musculoskeletal:        General: Normal range of motion.  Skin:    General: Skin is warm and dry.  Neurological:     General: No focal deficit present.     Mental Status: He is alert. Mental status is at baseline.  Psychiatric:        Mood and Affect: Mood normal.        Thought Content: Thought content normal.    Review of Systems  Constitutional: Negative.   HENT: Negative.   Eyes: Negative.   Respiratory: Negative.   Cardiovascular: Negative.   Gastrointestinal: Negative.   Musculoskeletal: Negative.   Skin: Negative.   Neurological: Negative.   Psychiatric/Behavioral: Negative.    Blood pressure (!) 126/99, pulse (!) 108, temperature 98.2 F (36.8 C), temperature source Oral, resp. rate 16, height 5' 9.5" (1.765 m), weight 98.9 kg, SpO2 97 %. Body mass  index is 31.73 kg/m.      Has this patient used any form of tobacco in the last 30 days? (Cigarettes, Smokeless Tobacco, Cigars, and/or Pipes) Yes, No  Blood Alcohol level:  Lab Results  Component Value Date   ETH 65 (H) 02/06/2021   ETH 123 (H) 01/11/2021    Metabolic Disorder Labs:  Lab Results  Component Value Date   HGBA1C 5.8 (H) 02/09/2018   MPG 119.76 02/09/2018   No results found for: PROLACTIN No results found for: CHOL, TRIG, HDL, CHOLHDL, VLDL, LDLCALC  See Psychiatric Specialty Exam and Suicide Risk Assessment completed by Attending Physician prior to discharge.  Discharge destination:  Home  Is patient on multiple antipsychotic therapies at discharge:  No   Has Patient had three or more failed trials of antipsychotic monotherapy by history:  No  Recommended Plan for Multiple Antipsychotic Therapies: NA  Discharge Instructions    Diet - low sodium heart healthy  Complete by: As directed    Increase activity slowly   Complete by: As directed    Increase activity slowly   Complete by: As directed      Allergies as of 02/08/2021   No Known Allergies     Medication List    STOP taking these medications   aspirin 325 MG EC tablet   docusate sodium 100 MG capsule Commonly known as: COLACE   lidocaine 2 % solution Commonly known as: XYLOCAINE   oxyCODONE 5 MG immediate release tablet Commonly known as: Oxy IR/ROXICODONE     TAKE these medications     Indication  amLODipine 5 MG tablet Commonly known as: NORVASC Take 1 tablet (5 mg total) by mouth daily.  Indication: High Blood Pressure Disorder   cetirizine 10 MG tablet Commonly known as: ZyrTEC Allergy Take 1 tablet (10 mg total) by mouth daily.  Indication: Perennial Allergic Rhinitis   escitalopram 10 MG tablet Commonly known as: LEXAPRO Take 1 tablet (10 mg total) by mouth daily. Start taking on: Feb 09, 2021  Indication: Generalized Anxiety Disorder   traZODone 50 MG  tablet Commonly known as: DESYREL Take 1 tablet (50 mg total) by mouth at bedtime as needed for sleep.  Indication: Trouble Sleeping       Follow-up Information    [facility] Follow up.   Specialty: Behavioral Health Why: Please present for walk in hours Mon-Fri @ 0830 am.  Contact information: [address] [place] [address] [phone]              Follow-up recommendations:  Activity:  Activity as tolerated Diet:  Low sodium Other:  Follow-up with outpatient mental health and substance abuse treatment  Comments: Prescriptions provided at discharge  Signed: Mordecai Rasmussen, MD 02/08/2021, 11:32 AM

## 2021-02-08 NOTE — Plan of Care (Signed)
  Problem: Education: Goal: Knowledge of [facility] Education information/materials will improve Outcome: Progressing Goal: Emotional status will improve Outcome: Progressing Goal: Mental status will improve Outcome: Progressing Goal: Verbalization of understanding the information provided will improve Outcome: Progressing   Problem: Activity: Goal: Interest or engagement in activities will improve Outcome: Progressing Goal: Sleeping patterns will improve Outcome: Progressing   Problem: Coping: Goal: Ability to verbalize frustrations and anger appropriately will improve Outcome: Progressing Goal: Ability to demonstrate self-control will improve Outcome: Progressing   

## 2021-02-08 NOTE — Progress Notes (Signed)
  [facility] Adult Case Management Discharge Plan :  Will you be returning to the same living situation after discharge:  No. At discharge, do you have transportation home?: Yes,  CSW to arrange transportation to patient's mother's residence. Do you have the ability to pay for your medications: No.  Release of information consent forms completed and in the chart;  Patient's signature needed at discharge.  Patient to Follow up at:  Follow-up Information    [facility] Follow up.   Specialty: Behavioral Health Why: Please present for walk in hours Mon-Fri @ 0830 am.  Contact information: [address] [place] [address] [phone]              Next level of care provider has access to [facility] Link:no  Safety Planning and Suicide Prevention discussed: Yes,  SPE completed with patient.     Has patient been referred to the Quitline?: Patient refused referral  Patient has been referred for addiction treatment: N/A  Corky Crafts, LCSWA 02/08/2021, 11:22 AM

## 2021-02-08 NOTE — Progress Notes (Signed)
Recreation Therapy Notes  INPATIENT RECREATION TR PLAN  Patient Details Name: Casey [name] MRN: [id number] DOB: 11/01/1990 Today's Date: 02/08/2021  Rec Therapy Plan Is patient appropriate for Therapeutic Recreation?: Yes Treatment times per week: at least 3 Estimated Length of Stay: 5-7 days TR Treatment/Interventions: Group participation (Comment)  Discharge Criteria Pt will be discharged from therapy if:: Discharged Treatment plan/goals/alternatives discussed and agreed upon by:: Patient/family  Discharge Summary Short term goals set: Patient will engage in groups without prompting or encouragement from LRT x3 group sessions within 5 recreation therapy group sessions Short term goals met: Not met Reason goals not met: Patient did not attend any groups Therapeutic equipment acquired: N/A Reason patient discharged from therapy: Discharge from hospital Pt/family agrees with progress & goals achieved: Yes Date patient discharged from therapy: 02/08/21   [name] 02/08/2021, 11:30 AM

## 2021-02-08 NOTE — Progress Notes (Signed)
Patient denies SI and HI, and AV hallucinations. Explained discharge instructions to patient. Verbalized understanding of instructions. Prescriptions given to patient. Patient escorted out by staff and transported by [employer].

## 2021-02-08 NOTE — Plan of Care (Signed)
  Problem: Group Participation Goal: STG - Patient will engage in groups without prompting or encouragement from LRT x3 group sessions within 5 recreation therapy group sessions Description: STG - Patient will engage in groups without prompting or encouragement from LRT x3 group sessions within 5 recreation therapy group sessions 02/08/2021 1129 by Ernest Haber, LRT Outcome: Not Applicable 02/13/8501 [id number] by Ernest Haber, LRT Outcome: Not Met (add Reason) Note: Patient did not attend any groups.

## 2021-03-07 IMAGING — CR DG CHEST 2V
2 series · 2 of 2 positions shown · non-contrast
Comparison: August 14, 2019.

CLINICAL DATA: Chest pain.

EXAM:
CHEST - 2 VIEW

[chest lat]
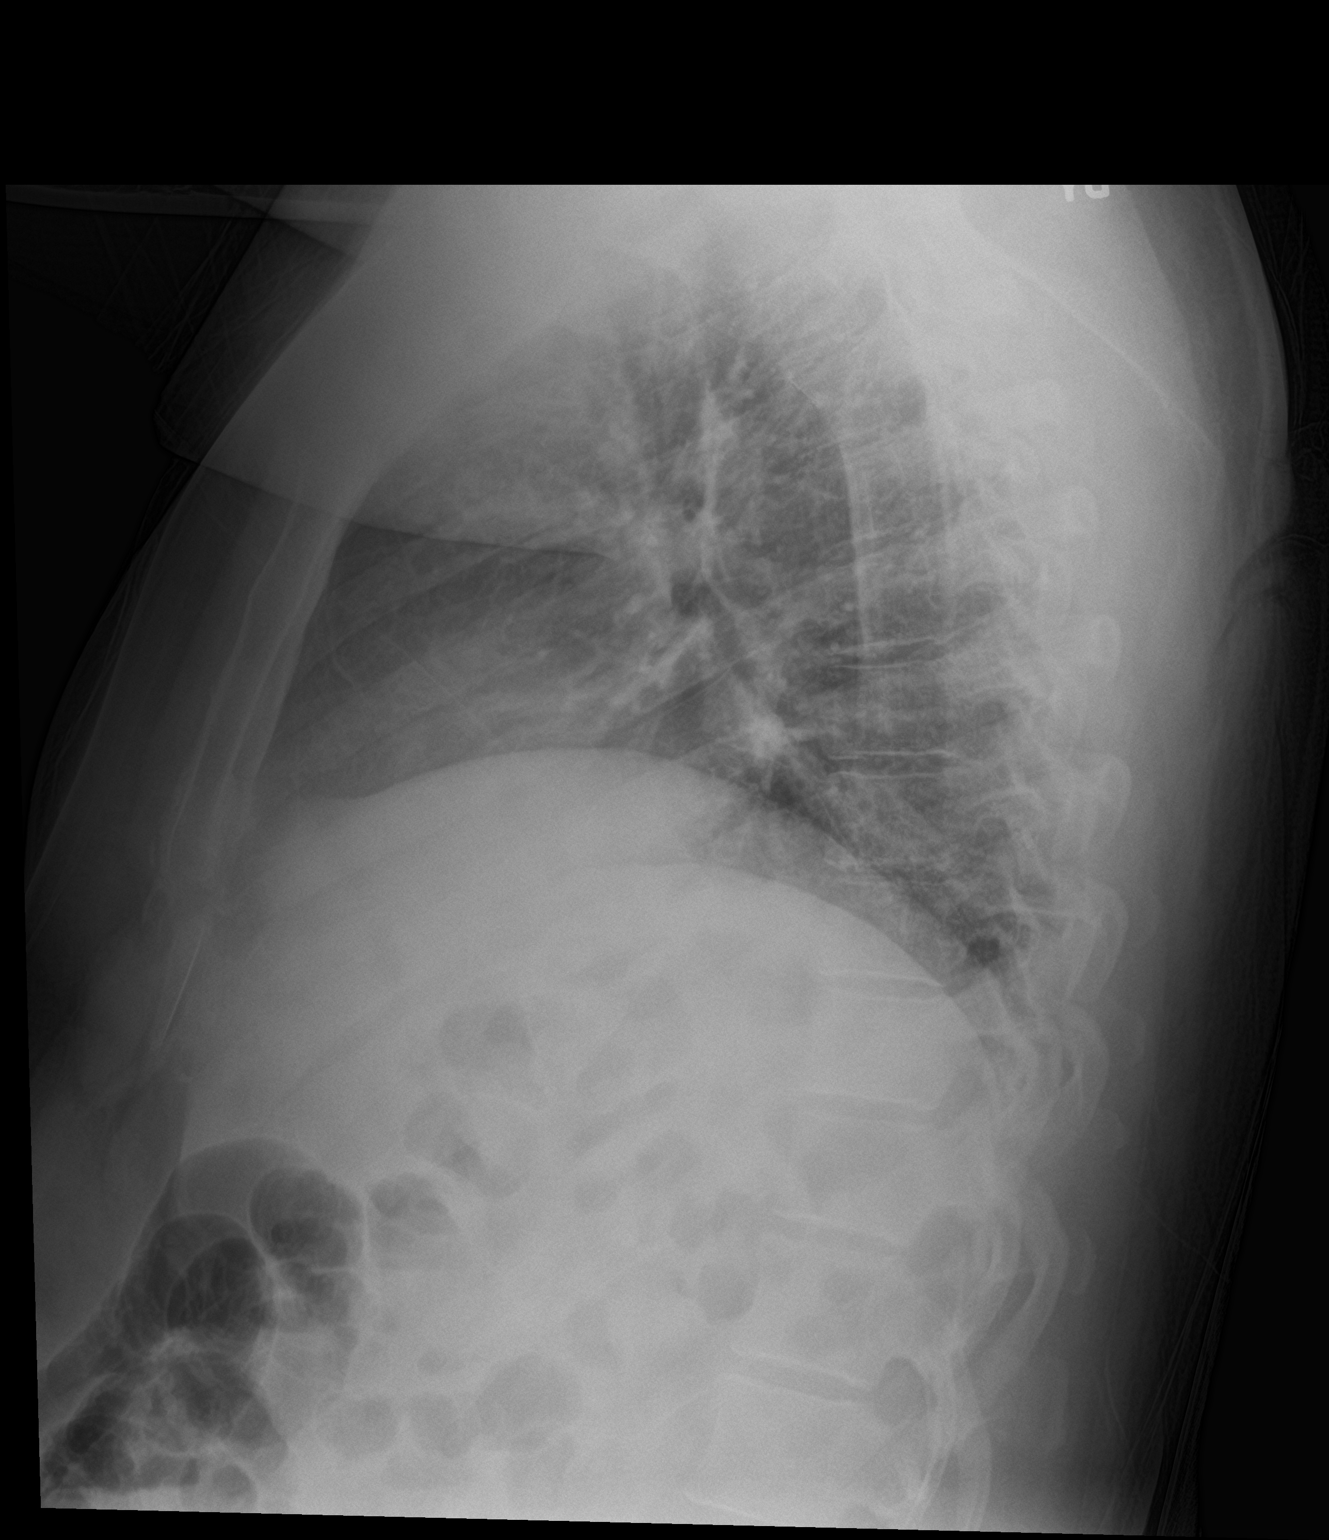

[chest ap]
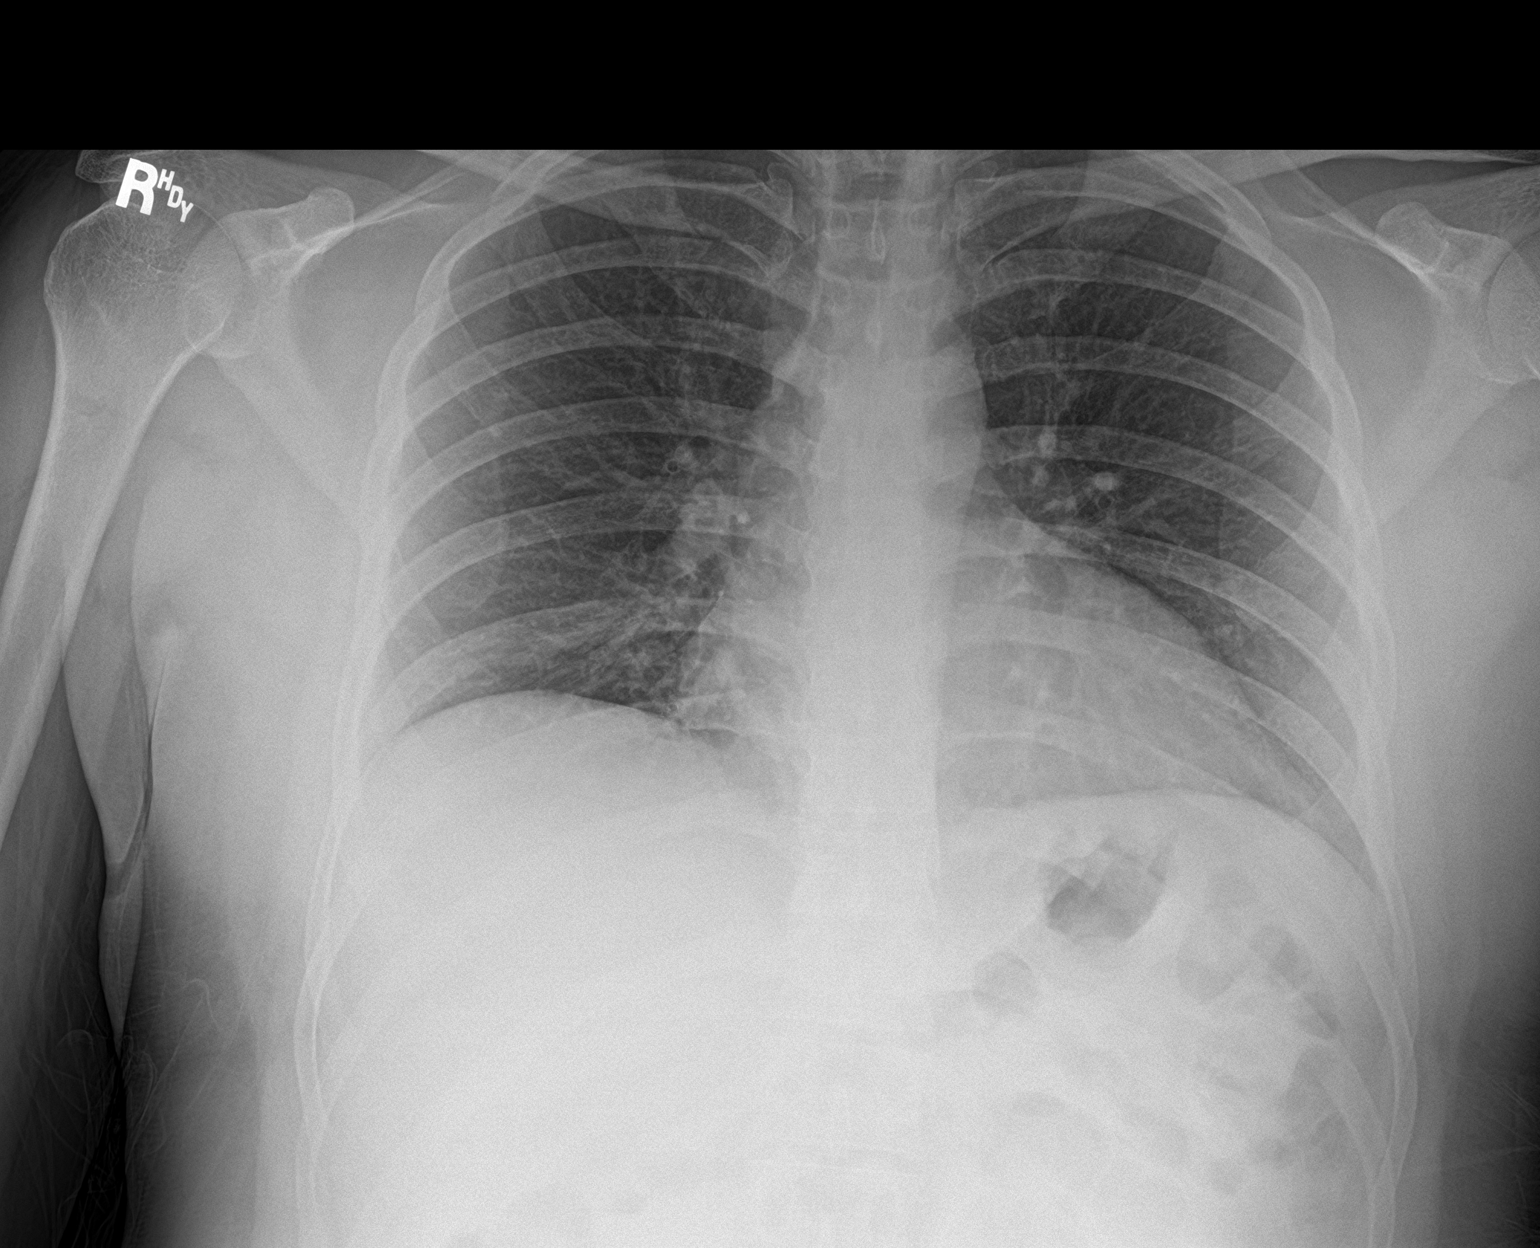

[2 of 2 positions shown; findings below may reference images not displayed]

FINDINGS: The heart size and mediastinal contours are within normal limits.
Both lungs are clear. No pneumothorax or pleural effusion is noted.
The visualized skeletal structures are unremarkable.
IMPRESSION: No active cardiopulmonary disease.

## 2022-04-24 ENCOUNTER — Encounter: Payer: Self-pay | Admitting: Emergency Medicine

## 2022-04-24 ENCOUNTER — Emergency Department: Payer: No Typology Code available for payment source

## 2022-04-24 DIAGNOSIS — R000 Tachycardia, unspecified: Secondary | ICD-10-CM | POA: Diagnosis not present

## 2022-04-24 DIAGNOSIS — F12129 Cannabis abuse with intoxication, unspecified: Secondary | ICD-10-CM | POA: Insufficient documentation

## 2022-04-24 DIAGNOSIS — R110 Nausea: Secondary | ICD-10-CM | POA: Insufficient documentation

## 2022-04-24 DIAGNOSIS — Z79899 Other long term (current) drug therapy: Secondary | ICD-10-CM | POA: Diagnosis not present

## 2022-04-24 DIAGNOSIS — I10 Essential (primary) hypertension: Secondary | ICD-10-CM | POA: Diagnosis not present

## 2022-04-24 LAB — COMPREHENSIVE METABOLIC PANEL
ALT: 26 U/L (ref 0–44)
AST: 27 U/L (ref 15–41)
Albumin: 4.5 g/dL (ref 3.5–5.0)
Alkaline Phosphatase: 55 U/L (ref 38–126)
Anion gap: 10 (ref 5–15)
BUN: 11 mg/dL (ref 6–20)
CO2: 24 mmol/L (ref 22–32)
Calcium: 9.1 mg/dL (ref 8.9–10.3)
Chloride: 105 mmol/L (ref 98–111)
Creatinine, Ser: 1.2 mg/dL (ref 0.61–1.24)
GFR, Estimated: >60 mL/min (ref >60)
Glucose, Bld: 147 mg/dL — ABNORMAL HIGH (ref 70–99)
Potassium: 3.8 mmol/L (ref 3.5–5.1)
Sodium: 139 mmol/L (ref 135–145)
Total Bilirubin: 0.6 mg/dL (ref 0.3–1.2)
Total Protein: 7.2 g/dL (ref 6.5–8.1)

## 2022-04-24 LAB — CBC
HCT: 42.3 % (ref 39.0–52.0)
Hemoglobin: 14.3 g/dL (ref 13.0–17.0)
MCH: 29.9 pg (ref 26.0–34.0)
MCHC: 33.8 g/dL (ref 30.0–36.0)
MCV: 88.5 fL (ref 80.0–100.0)
Platelets: 351 10*3/uL (ref 150–400)
RBC: 4.78 MIL/uL (ref 4.22–5.81)
RDW: 12.7 % (ref 11.5–15.5)
WBC: 11.5 10*3/uL — ABNORMAL HIGH (ref 4.0–10.5)
nRBC: 0 % (ref 0.0–0.2)

## 2022-04-24 LAB — ETHANOL: Alcohol, Ethyl (B): <10 mg/dL (ref <10)

## 2022-04-24 LAB — ACETAMINOPHEN LEVEL: Acetaminophen (Tylenol), Serum: <10 ug/mL — ABNORMAL LOW (ref 10–30)

## 2022-04-24 LAB — SALICYLATE LEVEL: Salicylate Lvl: <7 mg/dL — ABNORMAL LOW (ref 7.0–30.0)

## 2022-04-24 LAB — TROPONIN I (HIGH SENSITIVITY): Troponin I (High Sensitivity): 6 ng/L (ref <18)

## 2022-04-24 NOTE — ED Provider Triage Note (Signed)
  Emergency Medicine Provider Triage Evaluation Note  Casey [name] , a 30 y.o.male,  was evaluated in triage.  Pt complains of chest pain and recent ingestion.  Patient states that he was feeling chest tightness earlier today, which he tried to treat with THC Gummies.  He is unsure how many he took.  He states that he currently feels anxious and " out of it".  His chest pain persist.   Review of Systems  Positive: Chest pain, AMS Negative: Denies fever, chest pain, vomiting  Physical Exam  There were no vitals filed for this visit. Gen:   Awake, appears anxious. Resp:  Normal effort  MSK:   Moves extremities without difficulty  Other:    Medical Decision Making  Given the patient's initial medical screening exam, the following diagnostic evaluation has been ordered. The patient will be placed in the appropriate treatment space, once one is available, to complete the evaluation and treatment. I have discussed the plan of care with the patient and I have advised the patient that an ED physician or mid-level practitioner will reevaluate their condition after the test results have been received, as the results may give them additional insight into the type of treatment they may need.    Diagnostics: Labs, EKG, CXR  Treatments: none immediately   Varney Daily, [place] 04/24/22 2012

## 2022-04-24 NOTE — ED Triage Notes (Signed)
Pt arrived via [facility] with c/o N/V and palpitations after ingesting Delta gummy and drinking approx 50 ounces of beer since 1700 today. Pt able to answer all questions writer asking appropriately.

## 2022-04-25 ENCOUNTER — Emergency Department
Admission: EM | Admit: 2022-04-25 | Discharge: 2022-04-25 | Disposition: A | Discharge status: Home / Self Care | Payer: No Typology Code available for payment source | Attending: Emergency Medicine | Admitting: Emergency Medicine

## 2022-04-25 DIAGNOSIS — F12929 Cannabis use, unspecified with intoxication, unspecified: Secondary | ICD-10-CM

## 2022-04-25 LAB — TROPONIN I (HIGH SENSITIVITY): Troponin I (High Sensitivity): 6 ng/L (ref <18)

## 2022-04-25 MED ORDER — SODIUM CHLORIDE 0.9 % IV BOLUS (SEPSIS)
1000.0000 mL | Freq: Once | INTRAVENOUS | Status: AC
  Administered 2022-04-25: 1000 mL via INTRAVENOUS

## 2022-04-25 MED ORDER — ONDANSETRON HCL 4 MG/2ML IJ SOLN
4.0000 mg | Freq: Once | INTRAMUSCULAR | Status: AC
  Administered 2022-04-25: 4 mg via INTRAVENOUS
  Filled 2022-04-25: qty 2

## 2022-04-25 NOTE — ED Provider Notes (Signed)
[facility] Provider Note    Event Date/Time   First MD Initiated Contact with Patient 04/25/22 [phone]     (approximate)   History   Ingestion   HPI  Casey [name] is a 31 y.o. male with history of polysubstance abuse, hypertension who presents to the emergency department with his significant other with complaints of intoxication, nausea after drinking alcohol and ingesting a CBD gummy tonight.  He denies that this was an attempt to harm himself.  No vomiting.  Did have some chest pain that has resolved.  No shortness of breath, fever or cough.  Girlfriend reports he has been asking for something to eat and drink and has been asking to go home.   History provided by patient and significant other.    Past Medical History:  Diagnosis Date   Anxiety    H/O eye surgery    Hypertension    Polysubstance abuse [facility])     Past Surgical History:  Procedure Laterality Date   HAND SURGERY     ORIF ANKLE FRACTURE Left 12/16/2019   Procedure: OPEN REDUCTION INTERNAL FIXATION (ORIF) ANKLE FRACTURE;  Surgeon: Juanell Fairly, MD;  Location: [facility] ORS;  Service: Orthopedics;  Laterality: Left;    MEDICATIONS:  Prior to Admission medications   Medication Sig Start Date End Date Taking? Authorizing Provider  amLODipine (NORVASC) 5 MG tablet Take 1 tablet (5 mg total) by mouth daily. Patient not taking: Reported on 04/25/2022 02/08/21   Clapacs, Jackquline Denmark, MD  cetirizine (ZYRTEC ALLERGY) 10 MG tablet Take 1 tablet (10 mg total) by mouth daily. 02/08/21 02/08/22  Clapacs, Jackquline Denmark, MD  escitalopram (LEXAPRO) 10 MG tablet Take 1 tablet (10 mg total) by mouth daily. Patient not taking: Reported on 04/25/2022 02/09/21   Clapacs, Jackquline Denmark, MD  traZODone (DESYREL) 50 MG tablet Take 1 tablet (50 mg total) by mouth at bedtime as needed for sleep. Patient not taking: Reported on 04/25/2022 02/08/21   Audery Amel, MD    Physical Exam   Triage Vital Signs: ED Triage Vitals [04/24/22  2010]  Enc Vitals Group     BP (!) 132/92     Pulse Rate (!) 122     Resp 20     Temp 98.5 F (36.9 C)     Temp Source Oral     SpO2 95 %     Weight      Height      Head Circumference      Peak Flow      Pain Score      Pain Loc      Pain Edu?      Excl. in GC?     Most recent vital signs: Vitals:   04/24/22 2010 04/25/22 0146  BP: (!) 132/92 123/89  Pulse: (!) 122 89  Resp: 20 16  Temp: 98.5 F (36.9 C) 97.9 F (36.6 C)  SpO2: 95% 96%    CONSTITUTIONAL: Alert and oriented x 3 and responds appropriately to questions. Well-appearing; well-nourished, intoxicated but able to answer questions appropriately HEAD: Normocephalic, atraumatic EYES: Conjunctivae clear, pupils appear equal, sclera nonicteric ENT: normal nose; moist mucous membranes NECK: Supple, normal ROM CARD: RRR; S1 and S2 appreciated; no murmurs, no clicks, no rubs, no gallops RESP: Normal chest excursion without splinting or tachypnea; breath sounds clear and equal bilaterally; no wheezes, no rhonchi, no rales, no hypoxia or respiratory distress, speaking full sentences ABD/GI: Normal bowel sounds; non-distended; soft, non-tender, no rebound, no  guarding, no peritoneal signs BACK: The back appears normal EXT: Normal ROM in all joints; no deformity noted, no edema; no cyanosis SKIN: Normal color for age and race; warm; no rash on exposed skin NEURO: Moves all extremities equally, normal speech PSYCH: The patient's mood and manner are appropriate.   ED Results / Procedures / Treatments   LABS: (all labs ordered are listed, but only abnormal results are displayed) Labs Reviewed  COMPREHENSIVE METABOLIC PANEL - Abnormal; Notable for the following components:      Result Value   Glucose, Bld 147 (*)    All other components within normal limits  SALICYLATE LEVEL - Abnormal; Notable for the following components:   Salicylate Lvl <7.0 (*)    All other components within normal limits  ACETAMINOPHEN LEVEL  - Abnormal; Notable for the following components:   Acetaminophen (Tylenol), Serum <10 (*)    All other components within normal limits  CBC - Abnormal; Notable for the following components:   WBC 11.5 (*)    All other components within normal limits  ETHANOL  URINE DRUG SCREEN, QUALITATIVE ([facility] ONLY)  TROPONIN I (HIGH SENSITIVITY)  TROPONIN I (HIGH SENSITIVITY)     EKG:  EKG Interpretation  Date/Time:  Monday April 24 2022 20:12:43 EDT Ventricular Rate:  125 PR Interval:  132 QRS Duration: 80 QT Interval:  304 QTC Calculation: 438 R Axis:   66 Text Interpretation: Sinus tachycardia Possible Left atrial enlargement Cannot rule out Anterior infarct , age undetermined T wave abnormality, consider inferior ischemia Abnormal ECG When compared with ECG of 06-Feb-2021 17:50, Vent. rate has increased BY  54 BPM Non-specific change in ST segment in Inferior leads T wave inversion now evident in Inferior leads Nonspecific T wave abnormality now evident in Lateral leads Confirmed by Rochele Raring [phone]) on 04/25/2022 1:13:39 AM         RADIOLOGY: My personal review and interpretation of imaging: Chest x-ray clear.  I have personally reviewed all radiology reports.   DG Chest 2 View  Result Date: 04/24/2022 CLINICAL DATA:  Chest pain. EXAM: CHEST - 2 VIEW COMPARISON:  Chest radiograph dated 03/10/2019. FINDINGS: Shallow inspiration. No focal consolidation pleural effusion, pneumothorax. Stable cardiac silhouette. No acute osseous pathology. IMPRESSION: No active cardiopulmonary disease. Electronically Signed   By: Elgie Collard M.D.   On: 04/24/2022 20:44     PROCEDURES:  Critical Care performed: No     Procedures    IMPRESSION / MDM / ASSESSMENT AND PLAN / ED COURSE  I reviewed the triage vital signs and the nursing notes.    Patient here with complaints of atypical chest pain, nausea after drinking alcohol and ingesting a CBD gummy.    DIFFERENTIAL DIAGNOSIS  (includes but not limited to):   Intoxication, anxiety, doubt anemia, electrolyte derangement, thyroid dysfunction, ACS, PE, dissection, pneumonia, pneumothorax, CHF   Patient's presentation is most consistent with acute presentation with potential threat to life or bodily function.   PLAN: Labs obtained from triage including CBC, CMP, ethanol level, troponin x2, Tylenol and salicylate level, chest x-ray, EKG.  EKG shows sinus tachycardia with T wave inversions in inferior leads likely rate related.  Troponin x2 negative.  Chest x-ray reviewed/interpreted by myself radiologist and shows no infiltrate, edema, pneumothorax, cardiomegaly, widened mediastinum.  Normal electrolytes, hemoglobin.  Negative Tylenol, salicylate and ethanol levels.  Patient initially tachycardic on arrival but this has improved.  Will give IV fluids, Zofran for symptomatic relief.  Will p.o. challenge.  Anticipate discharge home  once they have a sober driver.  Girlfriend states that she has also been using CBD Gummies tonight.   MEDICATIONS GIVEN IN ED: Medications  sodium chloride 0.9 % bolus 1,000 mL (1,000 mLs Intravenous New Bag/Given 04/25/22 0145)  ondansetron (ZOFRAN) injection 4 mg (4 mg Intravenous Given 04/25/22 0140)     ED COURSE: Patient able to eat and drink.  Currently has no acute complaints.  Will discharge with sober driver.  At this time, I do not feel there is any life-threatening condition present. I reviewed all nursing notes, vitals, pertinent previous records.  All lab and urine results, EKGs, imaging ordered have been independently reviewed and interpreted by myself.  I reviewed all available radiology reports from any imaging ordered this visit.  Based on my assessment, I feel the patient is safe to be discharged home without further emergent workup and can continue workup as an outpatient as needed. Discussed all findings, treatment plan as well as usual and customary return precautions.  They  verbalize understanding and are comfortable with this plan.  Outpatient follow-up has been provided as needed.  All questions have been answered.  CONSULTS: No admission needed at this time given atypical chest pain with reassuring work-up.   OUTSIDE RECORDS REVIEWED: Reviewed patient's last orthopedic note with Dr. Lesleigh Noe on 12/16/2019.       FINAL CLINICAL IMPRESSION(S) / ED DIAGNOSES   Final diagnoses:  Intoxication with marijuana, with unspecified complication (HCC)     Rx / DC Orders   ED Discharge Orders     None        Note:  This document was prepared using Dragon voice recognition software and may include unintentional dictation errors.   Dashay Giesler, Layla Maw, DO 04/25/22 [phone]

## 2022-04-25 NOTE — Discharge Instructions (Signed)
Steps to find a Primary Care Provider (PCP):  Call [phone] or [phone] to access "[place] Find a Doctor Service."  2.  You may also go on the [facility] website at [email or web address]  

## 2022-04-25 NOTE — ED Notes (Signed)
Pt provided with PO fluid and crackers per request

## 2023-07-05 ENCOUNTER — Other Ambulatory Visit: Payer: Self-pay

## 2023-07-05 ENCOUNTER — Emergency Department
Admission: EM | Admit: 2023-07-05 | Discharge: 2023-07-05 | Disposition: A | Discharge status: Home / Self Care | Payer: MEDICAID | Attending: Emergency Medicine | Admitting: Emergency Medicine

## 2023-07-05 ENCOUNTER — Encounter: Payer: Self-pay | Admitting: *Deleted

## 2023-07-05 DIAGNOSIS — Z202 Contact with and (suspected) exposure to infections with a predominantly sexual mode of transmission: Secondary | ICD-10-CM | POA: Insufficient documentation

## 2023-07-05 MED ORDER — METRONIDAZOLE 500 MG PO TABS: 500.0000 mg | ORAL_TABLET | Freq: Two times a day (BID) | ORAL | 0 refills | Status: AC

## 2023-07-05 NOTE — ED Provider Notes (Signed)
   [facility] Provider Note    Event Date/Time   First MD Initiated Contact with Patient 07/05/23 1721     (approximate)   History   Exposure to STD   HPI  Casey [name] is a 32 y.o. male who reports his sexual partner was diagnosed with trichomonas.  He has no symptoms     Physical Exam   Triage Vital Signs: ED Triage Vitals  Encounter Vitals Group     BP 07/05/23 1633 (!) 149/114     Systolic BP Percentile --      Diastolic BP Percentile --      Pulse Rate 07/05/23 1633 99     Resp 07/05/23 1633 16     Temp 07/05/23 1633 98.5 F (36.9 C)     Temp Source 07/05/23 1633 Oral     SpO2 07/05/23 1633 95 %     Weight --      Height --      Head Circumference --      Peak Flow --      Pain Score 07/05/23 1632 0     Pain Loc --      Pain Education --      Exclude from Growth Chart --     Most recent vital signs: Vitals:   07/05/23 1633  BP: (!) 149/114  Pulse: 99  Resp: 16  Temp: 98.5 F (36.9 C)  SpO2: 95%     General: Awake, no distress.  CV:  Good peripheral perfusion.  Resp:  Normal effort.  Abd:  No distention.  Other:     ED Results / Procedures / Treatments   Labs (all labs ordered are listed, but only abnormal results are displayed) Labs Reviewed - No data to display   EKG     RADIOLOGY     PROCEDURES:  Critical Care performed:   Procedures   MEDICATIONS ORDERED IN ED: Medications - No data to display   IMPRESSION / MDM / ASSESSMENT AND PLAN / ED COURSE  I reviewed the triage vital signs and the nursing notes. Patient's presentation is most consistent with acute, uncomplicated illness.  Trichomonas often asymptomatic in men, will treat prophylactically x 1 week, no sexual intercourse during this time, outpatient follow-up with health department for further STD testing.        FINAL CLINICAL IMPRESSION(S) / ED DIAGNOSES   Final diagnoses:  STD exposure     Rx / DC Orders   ED  Discharge Orders          Ordered    metroNIDAZOLE (FLAGYL) 500 MG tablet  2 times daily after meals        07/05/23 1729             Note:  This document was prepared using Dragon voice recognition software and may include unintentional dictation errors.   Jene Every, MD 07/05/23 2027

## 2023-07-05 NOTE — ED Triage Notes (Signed)
Pt is here due to exposure to trichomonas and he would like to be tested. No symptoms at this time

## 2023-08-18 ENCOUNTER — Other Ambulatory Visit: Payer: Self-pay

## 2023-08-18 ENCOUNTER — Emergency Department
Admission: EM | Admit: 2023-08-18 | Discharge: 2023-08-18 | Disposition: A | Discharge status: Home / Self Care | Payer: MEDICAID | Attending: Emergency Medicine | Admitting: Emergency Medicine

## 2023-08-18 ENCOUNTER — Emergency Department: Payer: MEDICAID

## 2023-08-18 ENCOUNTER — Encounter: Payer: Self-pay | Admitting: Emergency Medicine

## 2023-08-18 DIAGNOSIS — F419 Anxiety disorder, unspecified: Secondary | ICD-10-CM | POA: Insufficient documentation

## 2023-08-18 DIAGNOSIS — R0789 Other chest pain: Secondary | ICD-10-CM | POA: Insufficient documentation

## 2023-08-18 DIAGNOSIS — R079 Chest pain, unspecified: Secondary | ICD-10-CM

## 2023-08-18 DIAGNOSIS — I10 Essential (primary) hypertension: Secondary | ICD-10-CM | POA: Diagnosis not present

## 2023-08-18 LAB — CBC
HCT: 44.6 % (ref 39.0–52.0)
Hemoglobin: 15.4 g/dL (ref 13.0–17.0)
MCH: 30 pg (ref 26.0–34.0)
MCHC: 34.5 g/dL (ref 30.0–36.0)
MCV: 86.8 fL (ref 80.0–100.0)
Platelets: 359 10*3/uL (ref 150–400)
RBC: 5.14 MIL/uL (ref 4.22–5.81)
RDW: 12.9 % (ref 11.5–15.5)
WBC: 11.6 10*3/uL — ABNORMAL HIGH (ref 4.0–10.5)
nRBC: 0 % (ref 0.0–0.2)

## 2023-08-18 LAB — TROPONIN I (HIGH SENSITIVITY)
Troponin I (High Sensitivity): 7 ng/L (ref <18)
Troponin I (High Sensitivity): 8 ng/L (ref <18)

## 2023-08-18 LAB — BASIC METABOLIC PANEL
Anion gap: 13 (ref 5–15)
BUN: 9 mg/dL (ref 6–20)
CO2: 21 mmol/L — ABNORMAL LOW (ref 22–32)
Calcium: 9.4 mg/dL (ref 8.9–10.3)
Chloride: 110 mmol/L (ref 98–111)
Creatinine, Ser: 0.94 mg/dL (ref 0.61–1.24)
GFR, Estimated: >60 mL/min (ref >60)
Glucose, Bld: 120 mg/dL — ABNORMAL HIGH (ref 70–99)
Potassium: 4.2 mmol/L (ref 3.5–5.1)
Sodium: 144 mmol/L (ref 135–145)

## 2023-08-18 NOTE — ED Provider Notes (Signed)
[facility] Provider Note    Event Date/Time   First MD Initiated Contact with Patient 08/18/23 [phone]     (approximate)   History   Panic Attack and Chest Pain   HPI  Casey [name] is a 32 y.o. male   with a history of alcohol abuse, cocaine use and depression.  Patient was in his normal health, he then reports that he was sitting in his chair.  He was arrested.  He has had 3 "panic attacks" since then as he describes them.  Reports he suffered from in the past.  He will suddenly start feeling short of breath with chest tightness for the last 10 to 15 minutes.  Happened 3 times.  He reports a very stressful.  He is currently calm and his symptoms are all gone away.  Feels much better  No history of recent trauma major surgeries, blood clots, leg swelling.    Cardiac history includes a history of hypertension no known history of coronary disease  Physical Exam   Triage Vital Signs: ED Triage Vitals  Encounter Vitals Group     BP 08/18/23 0438 (!) 186/95     Systolic BP Percentile --      Diastolic BP Percentile --      Pulse Rate 08/18/23 0438 (!) 114     Resp 08/18/23 0438 20     Temp 08/18/23 0440 97.8 F (36.6 C)     Temp Source 08/18/23 0440 Oral     SpO2 08/18/23 0438 99 %     Weight 08/18/23 0439 218 lb 0.6 oz (98.9 kg)     Height 08/18/23 0439 5\' 10"  (1.778 m)     Head Circumference --      Peak Flow --      Pain Score 08/18/23 0439 10     Pain Loc --      Pain Education --      Exclude from Growth Chart --     Most recent vital signs: Vitals:   08/18/23 0815 08/18/23 0830  BP:  122/86  Pulse:    Resp:    Temp:    SpO2: 94%      General: Awake, no distress.  Very pleasant, calm.  [place] police at bedside and CV:  Good peripheral perfusion.  Normal tones and rate Resp:  Normal effort.  Clear bilateral Abd:  No distention.  Soft nontender nondistended Other:  No lower extremity venous cords congestion or calf  tenderness.  No findings suggestive of DVT   ED Results / Procedures / Treatments   Labs (all labs ordered are listed, but only abnormal results are displayed) Labs Reviewed  BASIC METABOLIC PANEL - Abnormal; Notable for the following components:      Result Value   CO2 21 (*)    Glucose, Bld 120 (*)    All other components within normal limits  CBC - Abnormal; Notable for the following components:   WBC 11.6 (*)    All other components within normal limits  TROPONIN I (HIGH SENSITIVITY)  TROPONIN I (HIGH SENSITIVITY)   Labs notable for slightly reduced CO2, suspect possibly related to what he described as "panic attack" or hyperventilating.  Initial troponin normal.  EKG  EKG interpreted by me at 5 AM heart rate 100 QRS 90 QTc 430 Normal sinus rhythm, repolarization abnormality present.  No evidence of acute ischemia.  Morphology similar to July 2023   RADIOLOGY Chest x-ray interpreted by me as negative for  acute finding    PROCEDURES:  Critical Care performed: No  Procedures   MEDICATIONS ORDERED IN ED: Medications - No data to display   IMPRESSION / MDM / ASSESSMENT AND PLAN / ED COURSE  I reviewed the triage vital signs and the nursing notes.                              Differential diagnosis includes, but is not limited to, possible panic or anxiety attacks related to his recent arrest as well as reports a history of similar in the past, ACS that is seems highly unlikely based on clinical history and initial ECG/risk factor review, pneumothorax, pneumonia, infection etc.  His chest x-ray is clear.  He notes no risk factors would be suggestive of PE.  He is PERC negative.  His remote history of orthopedic surgery in the left leg, but no history of DVT and no findings of.  His initial ECG and cardiac workup including first troponin are normal and he is currently pain and symptom free calm and relaxed.  No wheezing no shortness of breath  No clinical signs or  symptoms of dissection.  He is awake alert well-oriented with normal neurologic evaluation  Patient's presentation is most consistent with acute complicated illness / injury requiring diagnostic workup.   The patient is on the cardiac monitor to evaluate for evidence of arrhythmia and/or significant heart rate changes.  Troponin #2 normal  Return precautions and treatment recommendations and follow-up discussed with the patient who is agreeable with the plan.      FINAL CLINICAL IMPRESSION(S) / ED DIAGNOSES   Final diagnoses:  Anxiety  Chest pain with low risk for cardiac etiology     Rx / DC Orders   ED Discharge Orders          Ordered    Ambulatory Referral to Primary Care (Establish Care)       Comments: Establish care   08/18/23 0927             Note:  This document was prepared using Dragon voice recognition software and may include unintentional dictation errors.   Sharyn Creamer, MD 08/18/23 [id number]

## 2023-08-18 NOTE — ED Triage Notes (Signed)
Patient brought into triage 2 by [facility] from [facility] with complaints of chest pain and panic attack. On arrival patient states my chest hurts and is gasping for air while crying. O2 sats normal and it appears this is more emotional than actual work of breathing. EKG completed in triage.

## 2024-10-21 ENCOUNTER — Ambulatory Visit: Payer: MEDICAID | Admitting: Family Medicine
